# Patient Record
Sex: Female | Born: 1970 | Race: Black or African American | Hispanic: No | Smoking: Former smoker
Health system: Southern US, Community
[De-identification: ages and names within clinical notes are randomized; demographics above are authoritative.]

## PROBLEM LIST (undated history)

## (undated) VITALS — BP 101/56 | HR 71 | Temp 99.0°F | Resp 18 | Ht 66.75 in | Wt 234.0 lb

## (undated) DIAGNOSIS — M549 Dorsalgia, unspecified: Secondary | ICD-10-CM

## (undated) DIAGNOSIS — F419 Anxiety disorder, unspecified: Secondary | ICD-10-CM

## (undated) DIAGNOSIS — E785 Hyperlipidemia, unspecified: Secondary | ICD-10-CM

## (undated) DIAGNOSIS — M069 Rheumatoid arthritis, unspecified: Secondary | ICD-10-CM

## (undated) DIAGNOSIS — F329 Major depressive disorder, single episode, unspecified: Secondary | ICD-10-CM

## (undated) DIAGNOSIS — K219 Gastro-esophageal reflux disease without esophagitis: Secondary | ICD-10-CM

## (undated) DIAGNOSIS — M1710 Unilateral primary osteoarthritis, unspecified knee: Secondary | ICD-10-CM

## (undated) DIAGNOSIS — F32A Depression, unspecified: Secondary | ICD-10-CM

## (undated) DIAGNOSIS — J45909 Unspecified asthma, uncomplicated: Secondary | ICD-10-CM

## (undated) DIAGNOSIS — Z8739 Personal history of other diseases of the musculoskeletal system and connective tissue: Secondary | ICD-10-CM

## (undated) DIAGNOSIS — R413 Other amnesia: Secondary | ICD-10-CM

## (undated) DIAGNOSIS — M1990 Unspecified osteoarthritis, unspecified site: Secondary | ICD-10-CM

## (undated) DIAGNOSIS — R569 Unspecified convulsions: Secondary | ICD-10-CM

## (undated) DIAGNOSIS — E669 Obesity, unspecified: Secondary | ICD-10-CM

## (undated) DIAGNOSIS — R51 Headache: Secondary | ICD-10-CM

## (undated) DIAGNOSIS — Z9889 Other specified postprocedural states: Secondary | ICD-10-CM

## (undated) DIAGNOSIS — G932 Benign intracranial hypertension: Secondary | ICD-10-CM

## (undated) DIAGNOSIS — M179 Osteoarthritis of knee, unspecified: Secondary | ICD-10-CM

## (undated) DIAGNOSIS — M797 Fibromyalgia: Secondary | ICD-10-CM

## (undated) DIAGNOSIS — F319 Bipolar disorder, unspecified: Secondary | ICD-10-CM

## (undated) DIAGNOSIS — L0291 Cutaneous abscess, unspecified: Secondary | ICD-10-CM

## (undated) DIAGNOSIS — R0600 Dyspnea, unspecified: Secondary | ICD-10-CM

## (undated) DIAGNOSIS — M542 Cervicalgia: Secondary | ICD-10-CM

## (undated) HISTORY — DX: Dyspnea, unspecified: R06.00

## (undated) HISTORY — DX: Personal history of other diseases of the musculoskeletal system and connective tissue: Z87.39

## (undated) HISTORY — DX: Rheumatoid arthritis, unspecified: M06.9

## (undated) HISTORY — DX: Cervicalgia: M54.2

## (undated) HISTORY — DX: Bipolar disorder, unspecified: F31.9

## (undated) HISTORY — PX: OOPHORECTOMY: SHX86

## (undated) HISTORY — DX: Benign intracranial hypertension: G93.2

## (undated) HISTORY — DX: Unilateral primary osteoarthritis, unspecified knee: M17.10

## (undated) HISTORY — DX: Fibromyalgia: M79.7

## (undated) HISTORY — DX: Other specified postprocedural states: Z98.890

## (undated) HISTORY — DX: Unspecified asthma, uncomplicated: J45.909

## (undated) HISTORY — DX: Obesity, unspecified: E66.9

## (undated) HISTORY — DX: Hyperlipidemia, unspecified: E78.5

## (undated) HISTORY — DX: Osteoarthritis of knee, unspecified: M17.9

## (undated) HISTORY — DX: Major depressive disorder, single episode, unspecified: F32.9

## (undated) HISTORY — DX: Dorsalgia, unspecified: M54.9

## (undated) HISTORY — DX: Other amnesia: R41.3

---

## 1992-02-16 HISTORY — PX: DILATION AND CURETTAGE OF UTERUS: SHX78

## 1997-10-16 ENCOUNTER — Other Ambulatory Visit: Admission: RE | Admit: 1997-10-16 | Discharge: 1997-10-16 | Payer: Self-pay | Admitting: Obstetrics and Gynecology

## 1997-11-08 ENCOUNTER — Inpatient Hospital Stay (HOSPITAL_COMMUNITY): Admission: AD | Admit: 1997-11-08 | Discharge: 1997-11-08 | Payer: Self-pay | Admitting: Obstetrics and Gynecology

## 1998-02-07 ENCOUNTER — Emergency Department (HOSPITAL_COMMUNITY): Admission: EM | Admit: 1998-02-07 | Discharge: 1998-02-07 | Payer: Self-pay

## 1998-02-11 ENCOUNTER — Ambulatory Visit (HOSPITAL_COMMUNITY): Admission: RE | Admit: 1998-02-11 | Discharge: 1998-02-11 | Payer: Self-pay | Admitting: Obstetrics and Gynecology

## 1998-03-17 ENCOUNTER — Inpatient Hospital Stay (HOSPITAL_COMMUNITY): Admission: AD | Admit: 1998-03-17 | Discharge: 1998-03-17 | Payer: Self-pay | Admitting: Obstetrics and Gynecology

## 1998-03-29 ENCOUNTER — Inpatient Hospital Stay (HOSPITAL_COMMUNITY): Admission: AD | Admit: 1998-03-29 | Discharge: 1998-03-29 | Payer: Self-pay | Admitting: Obstetrics and Gynecology

## 1998-04-14 ENCOUNTER — Inpatient Hospital Stay (HOSPITAL_COMMUNITY): Admission: AD | Admit: 1998-04-14 | Discharge: 1998-04-16 | Payer: Self-pay | Admitting: Obstetrics and Gynecology

## 1999-02-23 ENCOUNTER — Other Ambulatory Visit: Admission: RE | Admit: 1999-02-23 | Discharge: 1999-02-23 | Payer: Self-pay | Admitting: Obstetrics and Gynecology

## 1999-07-08 ENCOUNTER — Encounter: Payer: Self-pay | Admitting: Family Medicine

## 1999-07-08 ENCOUNTER — Ambulatory Visit (HOSPITAL_COMMUNITY): Admission: RE | Admit: 1999-07-08 | Discharge: 1999-07-08 | Payer: Self-pay | Admitting: Family Medicine

## 1999-07-09 ENCOUNTER — Encounter: Payer: Self-pay | Admitting: Family Medicine

## 2000-03-08 ENCOUNTER — Emergency Department (HOSPITAL_COMMUNITY): Admission: EM | Admit: 2000-03-08 | Discharge: 2000-03-09 | Payer: Self-pay | Admitting: Emergency Medicine

## 2000-04-12 ENCOUNTER — Encounter: Payer: Self-pay | Admitting: Obstetrics and Gynecology

## 2000-04-12 ENCOUNTER — Ambulatory Visit (HOSPITAL_COMMUNITY): Admission: RE | Admit: 2000-04-12 | Discharge: 2000-04-12 | Payer: Self-pay | Admitting: Obstetrics and Gynecology

## 2000-07-01 ENCOUNTER — Emergency Department (HOSPITAL_COMMUNITY): Admission: EM | Admit: 2000-07-01 | Discharge: 2000-07-01 | Payer: Self-pay | Admitting: Emergency Medicine

## 2000-12-09 ENCOUNTER — Other Ambulatory Visit: Admission: RE | Admit: 2000-12-09 | Discharge: 2000-12-09 | Payer: Self-pay | Admitting: Obstetrics and Gynecology

## 2001-12-28 ENCOUNTER — Other Ambulatory Visit (HOSPITAL_COMMUNITY): Admission: RE | Admit: 2001-12-28 | Discharge: 2001-12-29 | Payer: Self-pay | Admitting: Psychiatry

## 2002-07-09 ENCOUNTER — Emergency Department (HOSPITAL_COMMUNITY): Admission: EM | Admit: 2002-07-09 | Discharge: 2002-07-10 | Payer: Self-pay | Admitting: Emergency Medicine

## 2002-08-28 ENCOUNTER — Other Ambulatory Visit: Admission: RE | Admit: 2002-08-28 | Discharge: 2002-08-28 | Payer: Self-pay | Admitting: Obstetrics and Gynecology

## 2002-10-15 ENCOUNTER — Ambulatory Visit (HOSPITAL_COMMUNITY): Admission: RE | Admit: 2002-10-15 | Discharge: 2002-10-15 | Payer: Self-pay | Admitting: Obstetrics and Gynecology

## 2002-10-15 ENCOUNTER — Encounter: Payer: Self-pay | Admitting: Obstetrics and Gynecology

## 2002-11-05 ENCOUNTER — Inpatient Hospital Stay (HOSPITAL_COMMUNITY): Admission: AD | Admit: 2002-11-05 | Discharge: 2002-11-05 | Payer: Self-pay | Admitting: Obstetrics and Gynecology

## 2003-01-16 ENCOUNTER — Ambulatory Visit (HOSPITAL_COMMUNITY): Admission: RE | Admit: 2003-01-16 | Discharge: 2003-01-16 | Payer: Self-pay | Admitting: Obstetrics and Gynecology

## 2003-02-10 ENCOUNTER — Observation Stay (HOSPITAL_COMMUNITY): Admission: AD | Admit: 2003-02-10 | Discharge: 2003-02-11 | Payer: Self-pay | Admitting: Obstetrics and Gynecology

## 2003-02-22 ENCOUNTER — Inpatient Hospital Stay (HOSPITAL_COMMUNITY): Admission: AD | Admit: 2003-02-22 | Discharge: 2003-02-25 | Payer: Self-pay | Admitting: Obstetrics and Gynecology

## 2003-02-23 ENCOUNTER — Encounter (INDEPENDENT_AMBULATORY_CARE_PROVIDER_SITE_OTHER): Payer: Self-pay | Admitting: Specialist

## 2003-04-10 ENCOUNTER — Ambulatory Visit (HOSPITAL_COMMUNITY): Admission: RE | Admit: 2003-04-10 | Discharge: 2003-04-10 | Payer: Self-pay | Admitting: Obstetrics and Gynecology

## 2003-07-16 ENCOUNTER — Ambulatory Visit (HOSPITAL_COMMUNITY): Admission: RE | Admit: 2003-07-16 | Discharge: 2003-07-16 | Payer: Self-pay | Admitting: Obstetrics and Gynecology

## 2004-01-09 ENCOUNTER — Emergency Department (HOSPITAL_COMMUNITY): Admission: EM | Admit: 2004-01-09 | Discharge: 2004-01-09 | Payer: Self-pay | Admitting: Emergency Medicine

## 2004-05-12 ENCOUNTER — Emergency Department (HOSPITAL_COMMUNITY): Admission: EM | Admit: 2004-05-12 | Discharge: 2004-05-12 | Payer: Self-pay | Admitting: Emergency Medicine

## 2005-07-27 ENCOUNTER — Other Ambulatory Visit: Admission: RE | Admit: 2005-07-27 | Discharge: 2005-07-27 | Payer: Self-pay | Admitting: Gynecology

## 2006-10-20 ENCOUNTER — Emergency Department (HOSPITAL_COMMUNITY): Admission: EM | Admit: 2006-10-20 | Discharge: 2006-10-21 | Payer: Self-pay | Admitting: Emergency Medicine

## 2006-11-14 ENCOUNTER — Emergency Department (HOSPITAL_COMMUNITY): Admission: EM | Admit: 2006-11-14 | Discharge: 2006-11-14 | Payer: Self-pay | Admitting: Emergency Medicine

## 2006-11-21 ENCOUNTER — Emergency Department (HOSPITAL_COMMUNITY): Admission: EM | Admit: 2006-11-21 | Discharge: 2006-11-21 | Payer: Self-pay | Admitting: Emergency Medicine

## 2007-03-09 ENCOUNTER — Ambulatory Visit: Payer: Self-pay | Admitting: Gastroenterology

## 2007-03-09 LAB — CONVERTED CEMR LAB
AST: 18 units/L (ref 0–37)
Albumin: 4.1 g/dL (ref 3.5–5.2)
Basophils Absolute: 0 10*3/uL (ref 0.0–0.1)
Basophils Relative: 0.6 % (ref 0.0–1.0)
CO2: 29 meq/L (ref 19–32)
Chloride: 103 meq/L (ref 96–112)
Creatinine, Ser: 1 mg/dL (ref 0.4–1.2)
Eosinophils Relative: 0.9 % (ref 0.0–5.0)
HCT: 37.5 % (ref 36.0–46.0)
Hemoglobin: 12.8 g/dL (ref 12.0–15.0)
MCHC: 34 g/dL (ref 30.0–36.0)
Monocytes Absolute: 0.4 10*3/uL (ref 0.2–0.7)
Neutrophils Relative %: 59.9 % (ref 43.0–77.0)
RBC: 4.43 M/uL (ref 3.87–5.11)
RDW: 11.7 % (ref 11.5–14.6)
Sodium: 137 meq/L (ref 135–145)
TSH: 0.51 microintl units/mL (ref 0.35–5.50)
Total Bilirubin: 0.9 mg/dL (ref 0.3–1.2)
Total Protein: 7.5 g/dL (ref 6.0–8.3)
WBC: 4.5 10*3/uL (ref 4.5–10.5)

## 2007-03-10 ENCOUNTER — Encounter: Payer: Self-pay | Admitting: Gastroenterology

## 2007-03-10 ENCOUNTER — Ambulatory Visit: Payer: Self-pay | Admitting: Gastroenterology

## 2007-03-30 ENCOUNTER — Ambulatory Visit (HOSPITAL_COMMUNITY): Admission: RE | Admit: 2007-03-30 | Discharge: 2007-03-30 | Payer: Self-pay | Admitting: Gastroenterology

## 2007-03-30 ENCOUNTER — Encounter: Payer: Self-pay | Admitting: Family Medicine

## 2007-03-30 ENCOUNTER — Encounter: Payer: Self-pay | Admitting: Gastroenterology

## 2007-04-06 ENCOUNTER — Ambulatory Visit: Payer: Self-pay | Admitting: Gastroenterology

## 2007-09-24 ENCOUNTER — Emergency Department (HOSPITAL_COMMUNITY): Admission: EM | Admit: 2007-09-24 | Discharge: 2007-09-24 | Payer: Self-pay | Admitting: Emergency Medicine

## 2007-11-15 ENCOUNTER — Encounter: Admission: RE | Admit: 2007-11-15 | Discharge: 2007-11-15 | Payer: Self-pay | Admitting: Neurology

## 2007-12-20 ENCOUNTER — Ambulatory Visit: Payer: Self-pay | Admitting: Psychiatry

## 2007-12-20 ENCOUNTER — Inpatient Hospital Stay (HOSPITAL_COMMUNITY): Admission: AD | Admit: 2007-12-20 | Discharge: 2007-12-22 | Payer: Self-pay | Admitting: Psychiatry

## 2008-02-16 HISTORY — PX: CARDIAC CATHETERIZATION: SHX172

## 2008-03-04 ENCOUNTER — Encounter: Payer: Self-pay | Admitting: Gastroenterology

## 2008-03-07 ENCOUNTER — Telehealth (INDEPENDENT_AMBULATORY_CARE_PROVIDER_SITE_OTHER): Payer: Self-pay | Admitting: *Deleted

## 2008-03-11 ENCOUNTER — Encounter: Payer: Self-pay | Admitting: Gastroenterology

## 2008-03-11 DIAGNOSIS — K319 Disease of stomach and duodenum, unspecified: Secondary | ICD-10-CM | POA: Insufficient documentation

## 2008-03-12 ENCOUNTER — Encounter (INDEPENDENT_AMBULATORY_CARE_PROVIDER_SITE_OTHER): Payer: Self-pay | Admitting: *Deleted

## 2008-03-28 ENCOUNTER — Telehealth: Payer: Self-pay | Admitting: Gastroenterology

## 2008-04-04 ENCOUNTER — Telehealth: Payer: Self-pay | Admitting: Gastroenterology

## 2008-04-04 ENCOUNTER — Ambulatory Visit (HOSPITAL_COMMUNITY): Admission: RE | Admit: 2008-04-04 | Discharge: 2008-04-04 | Payer: Self-pay | Admitting: Gastroenterology

## 2008-04-04 ENCOUNTER — Ambulatory Visit: Payer: Self-pay | Admitting: Gastroenterology

## 2008-04-22 ENCOUNTER — Emergency Department (HOSPITAL_COMMUNITY): Admission: EM | Admit: 2008-04-22 | Discharge: 2008-04-22 | Payer: Self-pay | Admitting: Emergency Medicine

## 2008-05-23 ENCOUNTER — Emergency Department (HOSPITAL_COMMUNITY): Admission: EM | Admit: 2008-05-23 | Discharge: 2008-05-23 | Payer: Self-pay | Admitting: Emergency Medicine

## 2008-11-19 DIAGNOSIS — G43909 Migraine, unspecified, not intractable, without status migrainosus: Secondary | ICD-10-CM

## 2008-11-19 DIAGNOSIS — J45909 Unspecified asthma, uncomplicated: Secondary | ICD-10-CM | POA: Insufficient documentation

## 2008-11-19 DIAGNOSIS — E669 Obesity, unspecified: Secondary | ICD-10-CM

## 2008-11-20 ENCOUNTER — Ambulatory Visit: Payer: Self-pay | Admitting: Pulmonary Disease

## 2008-11-29 ENCOUNTER — Encounter: Payer: Self-pay | Admitting: Pulmonary Disease

## 2008-11-29 ENCOUNTER — Emergency Department (HOSPITAL_COMMUNITY): Admission: EM | Admit: 2008-11-29 | Discharge: 2008-11-29 | Payer: Self-pay | Admitting: Emergency Medicine

## 2008-12-05 ENCOUNTER — Ambulatory Visit: Payer: Self-pay | Admitting: Pulmonary Disease

## 2008-12-05 DIAGNOSIS — R0602 Shortness of breath: Secondary | ICD-10-CM

## 2008-12-05 DIAGNOSIS — J90 Pleural effusion, not elsewhere classified: Secondary | ICD-10-CM | POA: Insufficient documentation

## 2008-12-05 DIAGNOSIS — G932 Benign intracranial hypertension: Secondary | ICD-10-CM | POA: Insufficient documentation

## 2008-12-06 ENCOUNTER — Encounter: Payer: Self-pay | Admitting: Pulmonary Disease

## 2008-12-13 LAB — CONVERTED CEMR LAB: Angiotensin 1 Converting Enzyme: 25 units/L (ref 9–67)

## 2008-12-17 ENCOUNTER — Encounter: Payer: Self-pay | Admitting: Pulmonary Disease

## 2008-12-17 ENCOUNTER — Ambulatory Visit: Admission: RE | Admit: 2008-12-17 | Discharge: 2008-12-17 | Payer: Self-pay | Admitting: Pulmonary Disease

## 2008-12-20 ENCOUNTER — Telehealth: Payer: Self-pay | Admitting: Pulmonary Disease

## 2008-12-23 ENCOUNTER — Ambulatory Visit: Payer: Self-pay | Admitting: Pulmonary Disease

## 2008-12-23 DIAGNOSIS — I319 Disease of pericardium, unspecified: Secondary | ICD-10-CM | POA: Insufficient documentation

## 2008-12-24 ENCOUNTER — Telehealth (INDEPENDENT_AMBULATORY_CARE_PROVIDER_SITE_OTHER): Payer: Self-pay | Admitting: *Deleted

## 2009-01-23 ENCOUNTER — Ambulatory Visit (HOSPITAL_COMMUNITY): Admission: RE | Admit: 2009-01-23 | Discharge: 2009-01-23 | Payer: Self-pay | Admitting: Cardiology

## 2009-05-14 ENCOUNTER — Encounter: Admission: RE | Admit: 2009-05-14 | Discharge: 2009-05-14 | Payer: Self-pay | Admitting: Family Medicine

## 2009-10-15 ENCOUNTER — Emergency Department (HOSPITAL_COMMUNITY): Admission: EM | Admit: 2009-10-15 | Discharge: 2009-10-15 | Payer: Self-pay | Admitting: Emergency Medicine

## 2010-02-19 ENCOUNTER — Ambulatory Visit (HOSPITAL_COMMUNITY)
Admission: RE | Admit: 2010-02-19 | Discharge: 2010-02-19 | Payer: Self-pay | Source: Home / Self Care | Attending: Obstetrics and Gynecology | Admitting: Obstetrics and Gynecology

## 2010-03-07 ENCOUNTER — Encounter: Payer: Self-pay | Admitting: Obstetrics and Gynecology

## 2010-03-08 ENCOUNTER — Encounter: Payer: Self-pay | Admitting: Orthopedic Surgery

## 2010-03-19 NOTE — Procedures (Signed)
Summary: Recall / [facility]  Recall / [facility]   Imported By: Lennie Odor 07/18/2009 14:40:04  _____________________________________________________________________  External Attachment:    Type:   Image     Comment:   External Document

## 2010-04-30 LAB — COMPREHENSIVE METABOLIC PANEL
ALT: 38 U/L — ABNORMAL HIGH (ref 0–35)
Alkaline Phosphatase: 85 U/L (ref 39–117)
CO2: 27 mEq/L (ref 19–32)
GFR calc non Af Amer: >60 mL/min (ref >60)
Glucose, Bld: 98 mg/dL (ref 70–99)
Potassium: 3.8 mEq/L (ref 3.5–5.1)
Sodium: 140 mEq/L (ref 135–145)

## 2010-04-30 LAB — DIFFERENTIAL
Basophils Absolute: 0 10*3/uL (ref 0.0–0.1)
Basophils Relative: 1 % (ref 0–1)
Eosinophils Absolute: 0 10*3/uL (ref 0.0–0.7)
Neutrophils Relative %: 37 % — ABNORMAL LOW (ref 43–77)

## 2010-04-30 LAB — CBC
HCT: 35.8 % — ABNORMAL LOW (ref 36.0–46.0)
Hemoglobin: 11.8 g/dL — ABNORMAL LOW (ref 12.0–15.0)
MCHC: 33 g/dL (ref 30.0–36.0)
MCV: 83.5 fL (ref 78.0–100.0)
WBC: 4.6 10*3/uL (ref 4.0–10.5)

## 2010-04-30 LAB — LIPASE, BLOOD: Lipase: 31 U/L (ref 11–59)

## 2010-05-08 ENCOUNTER — Other Ambulatory Visit: Payer: Self-pay | Admitting: Obstetrics and Gynecology

## 2010-05-08 DIAGNOSIS — Z1231 Encounter for screening mammogram for malignant neoplasm of breast: Secondary | ICD-10-CM

## 2010-05-11 ENCOUNTER — Ambulatory Visit
Admission: RE | Admit: 2010-05-11 | Discharge: 2010-05-11 | Disposition: A | Discharge status: Home / Self Care | Payer: Medicaid Other | Source: Ambulatory Visit | Attending: Obstetrics and Gynecology | Admitting: Obstetrics and Gynecology

## 2010-05-11 ENCOUNTER — Ambulatory Visit: Payer: Self-pay

## 2010-05-11 DIAGNOSIS — Z1231 Encounter for screening mammogram for malignant neoplasm of breast: Secondary | ICD-10-CM

## 2010-05-19 LAB — POCT I-STAT 3, VENOUS BLOOD GAS (G3P V)
Bicarbonate: 19.2 mEq/L — ABNORMAL LOW (ref 20.0–24.0)
TCO2: 20 mmol/L (ref 0–100)
pCO2, Ven: 41.3 mmHg — ABNORMAL LOW (ref 45.0–50.0)
pH, Ven: 7.274 (ref 7.250–7.300)
pO2, Ven: 41 mmHg (ref 30.0–45.0)

## 2010-05-19 LAB — POCT I-STAT 3, ART BLOOD GAS (G3+)
pCO2 arterial: 37 mmHg (ref 35.0–45.0)
pH, Arterial: 7.322 — ABNORMAL LOW (ref 7.350–7.400)

## 2010-05-21 LAB — DIFFERENTIAL
Basophils Absolute: 0 10*3/uL (ref 0.0–0.1)
Eosinophils Absolute: 0 10*3/uL (ref 0.0–0.7)
Eosinophils Relative: 1 % (ref 0–5)
Lymphocytes Relative: 25 % (ref 12–46)
Monocytes Absolute: 0.2 10*3/uL (ref 0.1–1.0)

## 2010-05-21 LAB — CBC
HCT: 37.3 % (ref 36.0–46.0)
Hemoglobin: 12.4 g/dL (ref 12.0–15.0)
MCHC: 33.1 g/dL (ref 30.0–36.0)
MCV: 85.1 fL (ref 78.0–100.0)
Platelets: 326 10*3/uL (ref 150–400)
RDW: 13.3 % (ref 11.5–15.5)

## 2010-05-21 LAB — POCT I-STAT, CHEM 8
BUN: 16 mg/dL (ref 6–23)
Creatinine, Ser: 0.9 mg/dL (ref 0.4–1.2)
Glucose, Bld: 93 mg/dL (ref 70–99)
Potassium: 4.2 mEq/L (ref 3.5–5.1)
Sodium: 142 mEq/L (ref 135–145)
TCO2: 23 mmol/L (ref 0–100)

## 2010-05-21 LAB — POCT I-STAT 3, ART BLOOD GAS (G3+)
pCO2 arterial: 31.5 mmHg — ABNORMAL LOW (ref 35.0–45.0)
pH, Arterial: 7.393 (ref 7.350–7.400)
pO2, Arterial: 129 mmHg — ABNORMAL HIGH (ref 80.0–100.0)

## 2010-05-21 LAB — POCT CARDIAC MARKERS
CKMB, poc: <1 ng/mL — ABNORMAL LOW (ref 1.0–8.0)
Myoglobin, poc: 36.6 ng/mL (ref 12–200)

## 2010-06-30 NOTE — Assessment & Plan Note (Signed)
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 She is referred today  by Dr. Hulan Saas for evaluation of  recurrent epigastric pain.   Brittney [name] has had recurrent attacks of burning epigastric pain with rather  severe acid reflux symptoms for the last year. She was seen in the  emergency room in October and had a negative pregnancy test and was  placed on PPI therapy and was treated for a UTI. She had transient  relief of her symptoms but she currently is having reflux symptoms  almost every day with weekly attacks of rather severe epigastric burning  pain without radiation. There has been no nausea, vomiting, anorexia,  weight loss or hepatobiliary complaints. She has not had a gallbladder  ultrasound. She had a chest x-ray at the time of spontaneous delivery  several years ago that was normal and urinalysis was negative. I cannot  see recent laboratory data. She however gives no history of hepatitis or  pancreatitis. Her appetite has been good and she has had a 20 pound  weight gain over the last few months. She denies food intolerances but  she does have lactose intolerance. She has regular bowel movements,  denies melena or hematochezia. She does not abuse alcohol, cigarettes or  NSAIDs.   PAST MEDICAL HISTORY:  Otherwise noncontributory except for chronic  bipolar disorder and she is on Lithium, Abilify and Adderall XR. She has  been using p.r.n. Mylanta. She denies drug allergies.   FAMILY HISTORY:  Remarkable for alcoholism and cirrhosis in her mother.  She has a sister who recently had a cholecystectomy for presumed  gallstones.   SOCIAL  HISTORY:  She is married and lives with her husband and kids. She  has a Naval architect. She does not smoke or abuse ethanol.   REVIEW OF SYSTEMS:  Remarkable for amenorrhea, chronic fatigue, night  sweats, frequent urination, chronic insomnia. She denies any specific  cardiovascular, pulmonary, or neuropsychiatric problems at this time. As  mentioned above, she does carry a diagnosis of bipolar depression.   PHYSICAL EXAMINATION:  She is a healthy-appearing, black female  appearing her stated age in no acute distress.  She is 5 feet 8 and weighs 220 pounds. Blood pressure is 100/66 and  pulse was 76 and regular. I could not appreciate stigmata of chronic  liver disease or thyromegaly. Chest was clear and she was in a regular  rhythm without murmur, gallop or rub. There was no hepatosplenomegaly,  abdominal masses or significant tenderness. Peripheral extremities were  unremarkable. Mental status was normal.   ASSESSMENT:  1. Probable gastroesophageal reflux disease with episodes of      esophageal spasms - consider cholelithiasis.  2. History of bipolar depression.  3. Family history of alcoholic cirrhosis.  4. History of asthmatic bronchitis and perhaps related acid reflux.   RECOMMENDATIONS:  1. Outpatient endoscopy as soon as possible. Should this be      unremarkable we will proceed with ultrasonography.  2. Screening laboratory parameters including liver function tests.  3. Start AcipHex 20 mg 30 minutes before breakfast until workup can be      completed.  4. Continue other neuropsychiatric medications as per Dr. Hulan Saas and      Dr. Holley Bouche.     Vania Rea. Jarold Motto, MD, Caleen Essex, FAGA  Electronically Signed    DRP/MedQ  DD: 03/09/2007  DT: 03/09/2007  Job #: [id number]   cc:   Janetta Hora. Hulan Saas, M.D.  Holley Bouche, M.D.

## 2010-07-01 ENCOUNTER — Other Ambulatory Visit (HOSPITAL_COMMUNITY): Payer: Medicaid Other

## 2010-07-02 ENCOUNTER — Ambulatory Visit (HOSPITAL_COMMUNITY)
Admission: RE | Admit: 2010-07-02 | Payer: Medicaid Other | Source: Ambulatory Visit | Admitting: Obstetrics and Gynecology

## 2010-07-03 NOTE — Discharge Summary (Signed)
NAME:  Brittney [name], Brittney [name]                            ACCOUNT NO.:  [id number]   MEDICAL RECORD NO.:  [id number]                   PATIENT TYPE:  INP   LOCATION:  9319                                 FACILITY:  [facility]   PHYSICIAN:  Malachi Pro. Ambrose Mantle, M.D.              DATE OF BIRTH:  Feb 06, 1971   DATE OF ADMISSION:  02/22/2003  DATE OF DISCHARGE:  02/25/2003                                 DISCHARGE SUMMARY   HISTORY OF PRESENT ILLNESS:  This is a 40 year old black female para 4-0-1-  4, gravida 6, last period May 15, 2002, [facility] March 07, 2003 by ultrasound  at 7 weeks and 5 days admitted with contractions and cervical dilatation of  4 cm.  Blood group and type O+ with a negative antibody.  Sickle cell  negative.  RPR nonreactive.  Rubella immune.  Hepatitis B surface antigen  negative.  HIV declined.  GC and Chlamydia negative.  Triple screen normal.  One-hour Glucola 79.  Group B Strep negative.  Vaginal ultrasound on August 13, 2002:  Crown rump length 3.79 cm, 10 weeks 5 days, [facility] March 06, 2003.  Repeat ultrasound on October 15, 2002:  Average gestational age [redacted] weeks 5  days, [facility] March 06, 2003.  At 10-week ultrasound complex cyst of the right  ovary was seen.  It persisted throughout the pregnancy.  CA-125 was 8.1.  The patient was seen in the emergency room on November 05, 2002 for asthma.  On February 11, 2003 the patient was observed overnight at [facility]  [facility] [place] because of cervical change.  For the last 11 days the cervix  has been 4 cm dilated.  Today the patient called our office complaining of  contractions every three to five minutes.  Cervix was unchanged but because  of multiparity and 4 cm dilatation she was admitted.  She has been depressed  due to separation.  She claims her husband is bisexual.   ALLERGIES:  None known.   OPERATION:  In 1994 D&C.   PAST MEDICAL HISTORY:  Asthma.   FAMILY HISTORY:  Maternal grandmother heart disease.  Paternal  grandmother  lung cancer.   PAST OBSTETRICAL HISTORY:  In July of 1991 she delivered a 7 pound 4 ounce  female vaginally.  In August of 1993 a 6 pound 4 ounce female vaginally, May  of 1998 a 7 pound 9 ounce female vaginally, February 2000 a 5 pound 11 ounce  female vaginally.  In 2002 she had an early abortion.   PHYSICAL EXAMINATION:  VITAL SIGNS:  Normal vital signs.  HEART:  Normal.  LUNGS:  Normal.  ABDOMEN:  Soft.  Fundal height 41 cm.  Fetal heart tones normal.  There were  no decelerations.  PELVIC:  The cervix was 4+ cm, 70%, vertex at a -3/-4.  Artificial rupture  of membranes produced clear fluid.  The patient established a labor  pattern  without Pitocin.  By 7:40 p.m. the cervix was 5 cm, 80%, and anterior.  Vertex at a -3.  By 8:10 p.m. the cervix was 5+ cm.  The patient received an  epidural at 9:40 p.m.  Contractions were every three to four minutes.  Cervix 8-9 cm at 11:20 p.m.  By 12:25 a.m. the cervix was 9 cm.  Vertex at a  0 to -1 station.  She became fully dilated and with one push delivered  spontaneously OA over an intact perineum by Dr. Ambrose Mantle a living female  infant 6 pounds 13 ounces, Apgars of 9 at one and 9 at five minutes.  One  loop of nuchal cord was present.  Placenta was intact.  Uterus normal.  Blood loss about 500 mL.  The anterior cervix prolapsed through the  introitus after delivery.  Postpartum the patient did well and was  discharged on the second postpartum day.  Initial hemoglobin 11.9,  hematocrit 34.8, white count 11,400, platelet count 305,000.  Follow-up  hemoglobin 11.2.  RPR nonreactive.   FINAL DIAGNOSES:  1. Intrauterine pregnancy at 38+ weeks, delivered occiput anterior.  2. Right adnexal mass.   OPERATION:  Spontaneous delivery occiput anterior.   FINAL CONDITION:  Improved.   DISCHARGE INSTRUCTIONS:  Our regular discharge instruction booklet.  The  patient is advised that we will follow up the adnexal mass postpartum and  quite  likely the patient will require surgery if the mass persists.                                               Malachi Pro. Ambrose Mantle, M.D.    TFH/MEDQ  D:  02/25/2003  T:  02/25/2003  Job:  [id number]

## 2010-07-03 NOTE — Discharge Summary (Signed)
NAME:  Brittney [name], Brittney [name]                            ACCOUNT NO.:  [id number]   MEDICAL RECORD NO.:  [id number]                   PATIENT TYPE:  OBV   LOCATION:  9164                                 [facility]ILITY:  [facility]   PHYSICIAN:  Malachi Pro. Ambrose Mantle, M.D.              DATE OF BIRTH:  [birth year]   DATE OF ADMISSION:  02/10/2003  DATE OF DISCHARGE:                                 DISCHARGE SUMMARY   HOSPITAL COURSE:  This is a 39 year old black female para 3-1-1-4 gravida 6  with [facility] March 06, 2003 admitted with cervical change, uterine  contractions, and late decelerations with the contractions.  The patient's  [facility] is March 06, 2003 based on an ultrasound at 10 weeks five days.  She  has been followed with a complex right adnexal cyst.  The patient began  contracting on February 10, 2003.  She came here for evaluation and  according to the nurse's exam the cervix was 1.5 cm dilated when she was  first evaluated.  I examined her later and found the cervix to be 3 cm.  She  was also supine in bed without a lateral tilt and had late decelerations  with at least four contractions.  This was corrected by position change.  Her past medical history revealed no known allergies.  She did have a  history of asthma.  She had a D&C in 1994.  She had a minimal significant  family history.  Her maternal grandmother had heart disease and paternal  grandmother had lung cancer.  She does not smoke, drink, or take drugs.  She  had a history of three term deliveries, one preterm, and one early abortion.  On admission her vital signs were normal.  Heart and lungs were clear.  The  abdomen was soft.  Fundal height had been 34 cm on January 23, 2003.  By my  exam the cervix was 3 cm, 50%, vertex, at a -3.  She was contracting  regularly but unfortunately was on her back and had late decelerations.  Her  contractions subsequently spaced out.  The baby was reactive but before she  was discharged I felt it was  necessary to do an OCT which we did and the OCT  was negative.  The oxytocin was stopped.  I reexamined her.  There has been  no significant cervical change since she was admitted for observation 13  hours ago.   FINAL DIAGNOSES:  1. Intrauterine pregnancy at 36+ weeks.  2. False labor.   OPERATION:  None.   FINAL CONDITION:  Improved.   INSTRUCTIONS:  Watch the baby's movements, call with any unusual problems,  avoid heavy lifting or strenuous activity, no vaginal entrance, and return  to the office on February 13, 2003 for follow-up examination.  Malachi Pro. Ambrose Mantle, M.D.    TFH/MEDQ  D:  02/11/2003  T:  02/11/2003  Job:  [id number]

## 2010-11-17 LAB — COMPREHENSIVE METABOLIC PANEL
ALT: 8
Alkaline Phosphatase: 68
CO2: 25
Chloride: 111
GFR calc non Af Amer: >60
Glucose, Bld: 87
Potassium: 3.7
Sodium: 141
Total Bilirubin: 0.8

## 2010-11-17 LAB — URINALYSIS, ROUTINE W REFLEX MICROSCOPIC
Glucose, UA: NEGATIVE
Ketones, ur: 15 — AB
Protein, ur: NEGATIVE

## 2010-11-17 LAB — DRUGS OF ABUSE SCREEN W/O ALC, ROUTINE URINE
Cocaine Metabolites: NEGATIVE
Creatinine,U: 346.4
Methadone: NEGATIVE
Opiate Screen, Urine: NEGATIVE

## 2010-11-17 LAB — CBC
HCT: 38.2
Hemoglobin: 12.4
RBC: 4.57

## 2010-11-17 LAB — DIFFERENTIAL
Basophils Absolute: 0
Basophils Relative: 1
Eosinophils Absolute: 0.1
Monocytes Relative: 9
Neutrophils Relative %: 44

## 2010-11-17 LAB — TSH: TSH: 0.633

## 2010-11-26 LAB — POCT URINALYSIS DIP (DEVICE)
Bilirubin Urine: NEGATIVE
Glucose, UA: NEGATIVE
Hgb urine dipstick: NEGATIVE
Specific Gravity, Urine: 1.015

## 2010-11-26 LAB — POCT PREGNANCY, URINE: Operator id: 116391

## 2011-10-04 ENCOUNTER — Emergency Department (HOSPITAL_COMMUNITY)
Admission: EM | Admit: 2011-10-04 | Discharge: 2011-10-04 | Disposition: A | Discharge status: Home / Self Care | Payer: No Typology Code available for payment source | Attending: Emergency Medicine | Admitting: Emergency Medicine

## 2011-10-04 ENCOUNTER — Encounter (HOSPITAL_COMMUNITY): Payer: Self-pay | Admitting: *Deleted

## 2011-10-04 ENCOUNTER — Emergency Department (HOSPITAL_COMMUNITY): Payer: No Typology Code available for payment source

## 2011-10-04 DIAGNOSIS — T07XXXA Unspecified multiple injuries, initial encounter: Secondary | ICD-10-CM | POA: Insufficient documentation

## 2011-10-04 DIAGNOSIS — Z87891 Personal history of nicotine dependence: Secondary | ICD-10-CM | POA: Insufficient documentation

## 2011-10-04 DIAGNOSIS — Y92410 Unspecified street and highway as the place of occurrence of the external cause: Secondary | ICD-10-CM | POA: Insufficient documentation

## 2011-10-04 DIAGNOSIS — Z888 Allergy status to other drugs, medicaments and biological substances status: Secondary | ICD-10-CM | POA: Insufficient documentation

## 2011-10-04 MED ORDER — HYDROCODONE-ACETAMINOPHEN 5-325 MG PO TABS: 2.0000 | ORAL_TABLET | ORAL | Status: AC | PRN

## 2011-10-04 MED ORDER — HYDROCODONE-ACETAMINOPHEN 5-325 MG PO TABS
2.0000 | ORAL_TABLET | Freq: Once | ORAL | Status: AC
  Administered 2011-10-04: 2 via ORAL
  Filled 2011-10-04: qty 2
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
## 2011-10-04 NOTE — ED Notes (Signed)
Pt discharged home. Had no further questions at the time. Encouraged to follow up with PCP. 

## 2012-11-07 ENCOUNTER — Encounter (HOSPITAL_COMMUNITY): Payer: Self-pay | Admitting: Pharmacist

## 2012-11-20 ENCOUNTER — Other Ambulatory Visit: Payer: Self-pay | Admitting: Obstetrics and Gynecology

## 2012-11-20 ENCOUNTER — Other Ambulatory Visit (HOSPITAL_COMMUNITY): Payer: Medicaid Other

## 2012-11-20 ENCOUNTER — Encounter (HOSPITAL_COMMUNITY)
Admission: RE | Admit: 2012-11-20 | Discharge: 2012-11-20 | Disposition: A | Discharge status: Home / Self Care | Payer: Medicaid Other | Source: Ambulatory Visit | Attending: Obstetrics and Gynecology | Admitting: Obstetrics and Gynecology

## 2012-11-20 ENCOUNTER — Encounter (HOSPITAL_COMMUNITY): Payer: Self-pay

## 2012-11-20 HISTORY — DX: Gastro-esophageal reflux disease without esophagitis: K21.9

## 2012-11-20 HISTORY — DX: Unspecified convulsions: R56.9

## 2012-11-20 HISTORY — DX: Unspecified osteoarthritis, unspecified site: M19.90

## 2012-11-20 HISTORY — DX: Depression, unspecified: F32.A

## 2012-11-20 HISTORY — DX: Major depressive disorder, single episode, unspecified: F32.9

## 2012-11-20 HISTORY — DX: Anxiety disorder, unspecified: F41.9

## 2012-11-20 HISTORY — DX: Headache: R51

## 2012-11-20 LAB — CBC
Hemoglobin: 12.6 g/dL (ref 12.0–15.0)
MCHC: 32.3 g/dL (ref 30.0–36.0)
Platelets: 290 10*3/uL (ref 150–400)
RBC: 4.71 MIL/uL (ref 3.87–5.11)
RDW: 13.4 % (ref 11.5–15.5)
WBC: 5.7 10*3/uL (ref 4.0–10.5)

## 2012-11-20 NOTE — H&P (Signed)
NAME:  Brittney [name], Brittney [name]                      ACCOUNT NO.:  MEDICAL RECORD NO.:  [id number]  LOCATION:                                 FACILITY:  PHYSICIAN:  Malachi Pro. Ambrose Mantle, M.D. DATE OF BIRTH:  October 24, 1970  DATE OF ADMISSION: DATE OF DISCHARGE:                             HISTORY & PHYSICAL   PRESENT ILLNESS:  This is a 42 year old black female, para 4-1-1-5, gravida 6 who is admitted for surgical removal of a pelvic mass.  The patient's last menstrual period was in 2010.  She has had ovarian cyst for a long time.  The ovarian cyst was 1st noted on a CT scan on May 14, 2009 and was 3.4 x 4.6 cm.  The patient was followed by ultrasound, however, she moved to [place] and was lost followup for 2 and half years in March 2012 until August 2014.  When she was last seen here in 2011, she admitted this cyst was 4.5 x 4.3 x 2.9 cm.  It had scattered faint low-level echoes within the lumen and there was 11 x 4 mm possible solid nodule.  As stated the patient moved to [place] and I did not see her again until her return here in 2014.  She underwent an ultrasound on September 28, 2012, that showed a 7.2 x 5 cm cyst of the right ovary that had multiple low-level echoes within the lumen and a possible nodule on the surface of the inferior aspect of the ovary.  CA- 125 was normal and I advised the patient to have it removed because, 1. She is post menopausal. 2. The mass was growing when it was initially seen. 3. The low-level echoes within the lumen were increased as well as the     mural nodule was present.  She agreed and we were ready to do     surgery.  PAST MEDICAL HISTORY:  No known allergies.  She claims to have rheumatoid arthritis from 2010.  She had bipolar diagnosed in 2000 and she has had obesity since 2004.  SURGICAL HISTORY:  D and C in 2002 and 1994.  MEDICATIONS:  Bupropion 300 mg once a day, meloxicam as needed, Seroquel 300 mg at night.  PHYSICAL EXAMINATION:  GENERAL:   This is a well-developed, well- nourished, slightly obese black female, in no distress. VITAL SIGNS:  Blood pressure is 120/72, pulse is 80, height is 5 feet 8 inches, weight 226 pounds.  BMI 34.4. HEAD, EYES, NOSE AND THROAT:  Normal. NECK:  Supple without thyromegaly. LUNGS:  Clear to auscultation. HEART:  Normal size and sounds.  No murmurs. ABDOMEN:  Soft, nontender.  There are no masses palpable. BREASTS:  Soft without masses. PELVIS:  Vulva and vagina are clean.  The cervix is clean.  The uterus is hard to feel.  There is a 6-8 cm right adnexal mass that is cystic.  ADMITTING IMPRESSION:  Persistent right adnexal mass.  The patient is admitted for laparoscopy possible laparotomy to remove the pelvic mass. She understands the risks of surgery include, but are not limited to, hemorrhage, need for reoperation and/or transfusion, infection, pulmonary embolus, and other deep venous thrombosis  problems.  She understands and agrees to proceed.     Malachi Pro. Ambrose Mantle, M.D.     TFH/MEDQ  D:  11/20/2012  T:  11/20/2012  Job:  [id number]

## 2012-11-20 NOTE — Patient Instructions (Addendum)
Your procedure is scheduled on: 11/21/2012  Enter through the Maternity Admissions of [facility] at: 0600AM  Pick up the phone at the desk and dial [phone].  Call this number if you have problems the morning of surgery: [phone].  Remember: Do NOT eat food: AFTER MIDNIGHT 11/20/2012 Do NOT drink clear liquids after:AFTER MIDNIGHT 11/20/2012 Take these medicines the morning of surgery with a SIP OF WATER: N/A  Do NOT wear jewelry, make-up, or nail polish. Do NOT wear lotions, powders, or perfumes.  You may wear deoderant. Do NOT shave for 48 hours prior to surgery. Do NOT bring valuables to the hospital. Contacts, dentures, or bridgework may not be worn into surgery. Leave suitcase in car.  After surgery it may be brought to your room.  For patients admitted to the hospital, checkout time is 11:00 AM the day of discharge.

## 2012-11-21 ENCOUNTER — Ambulatory Visit (HOSPITAL_COMMUNITY): Payer: Medicaid Other | Admitting: Anesthesiology

## 2012-11-21 ENCOUNTER — Ambulatory Visit (HOSPITAL_COMMUNITY)
Admission: RE | Admit: 2012-11-21 | Discharge: 2012-11-22 | Disposition: A | Discharge status: Home / Self Care | Payer: Medicaid Other | Source: Ambulatory Visit | Attending: Obstetrics and Gynecology | Admitting: Obstetrics and Gynecology

## 2012-11-21 ENCOUNTER — Encounter (HOSPITAL_COMMUNITY): Payer: Self-pay | Admitting: Anesthesiology

## 2012-11-21 ENCOUNTER — Encounter (HOSPITAL_COMMUNITY): Payer: Self-pay | Admitting: *Deleted

## 2012-11-21 ENCOUNTER — Encounter (HOSPITAL_COMMUNITY)
Admission: RE | Disposition: A | Discharge status: Home / Self Care | Payer: Self-pay | Source: Ambulatory Visit | Attending: Obstetrics and Gynecology

## 2012-11-21 DIAGNOSIS — D251 Intramural leiomyoma of uterus: Secondary | ICD-10-CM | POA: Insufficient documentation

## 2012-11-21 DIAGNOSIS — N9489 Other specified conditions associated with female genital organs and menstrual cycle: Secondary | ICD-10-CM | POA: Insufficient documentation

## 2012-11-21 DIAGNOSIS — D279 Benign neoplasm of unspecified ovary: Secondary | ICD-10-CM | POA: Insufficient documentation

## 2012-11-21 DIAGNOSIS — N83209 Unspecified ovarian cyst, unspecified side: Secondary | ICD-10-CM

## 2012-11-21 DIAGNOSIS — E669 Obesity, unspecified: Secondary | ICD-10-CM

## 2012-11-21 HISTORY — PX: LAPAROSCOPY: SHX197

## 2012-11-21 LAB — COMPREHENSIVE METABOLIC PANEL
ALT: 19 U/L (ref 0–35)
AST: 23 U/L (ref 0–37)
Albumin: 3.7 g/dL (ref 3.5–5.2)
Alkaline Phosphatase: 109 U/L (ref 39–117)
BUN: 16 mg/dL (ref 6–23)
Calcium: 9.2 mg/dL (ref 8.4–10.5)
GFR calc Af Amer: 88 mL/min — ABNORMAL LOW (ref >90)
Glucose, Bld: 101 mg/dL — ABNORMAL HIGH (ref 70–99)
Potassium: 3.9 mEq/L (ref 3.5–5.1)
Sodium: 137 mEq/L (ref 135–145)
Total Bilirubin: 0.2 mg/dL — ABNORMAL LOW (ref 0.3–1.2)
Total Protein: 7.8 g/dL (ref 6.0–8.3)

## 2012-11-21 LAB — CBC
HCT: 34 % — ABNORMAL LOW (ref 36.0–46.0)
Hemoglobin: 11.1 g/dL — ABNORMAL LOW (ref 12.0–15.0)
MCV: 82.7 fL (ref 78.0–100.0)
RBC: 4.11 MIL/uL (ref 3.87–5.11)
WBC: 10.8 10*3/uL — ABNORMAL HIGH (ref 4.0–10.5)

## 2012-11-21 LAB — URINALYSIS, ROUTINE W REFLEX MICROSCOPIC
Bilirubin Urine: NEGATIVE
Glucose, UA: NEGATIVE mg/dL
Hgb urine dipstick: NEGATIVE
Specific Gravity, Urine: >1.03 — ABNORMAL HIGH (ref 1.005–1.030)
pH: 6 (ref 5.0–8.0)

## 2012-11-21 SURGERY — LAPAROSCOPY OPERATIVE
Anesthesia: General | Site: Abdomen | Wound class: Clean Contaminated

## 2012-11-21 MED ORDER — HYDROMORPHONE HCL PF 1 MG/ML IJ SOLN
0.2500 mg | INTRAMUSCULAR | Status: DC | PRN
  Administered 2012-11-21 (×3): 0.5 mg via INTRAVENOUS

## 2012-11-21 MED ORDER — OXYCODONE-ACETAMINOPHEN 5-325 MG PO TABS
1.0000 | ORAL_TABLET | ORAL | Status: DC | PRN
  Administered 2012-11-21 – 2012-11-22 (×4): 2 via ORAL
  Filled 2012-11-21 (×4): qty 2

## 2012-11-21 MED ORDER — DULOXETINE HCL 60 MG PO CPEP
60.0000 mg | ORAL_CAPSULE | Freq: Every day | ORAL | Status: DC
  Administered 2012-11-21: 60 mg via ORAL
  Filled 2012-11-21: qty 1

## 2012-11-21 MED ORDER — MIDAZOLAM HCL 2 MG/2ML IJ SOLN: 0.5000 mg | Freq: Once | INTRAMUSCULAR | Status: DC | PRN

## 2012-11-21 MED ORDER — PROPOFOL 10 MG/ML IV BOLUS
INTRAVENOUS | Status: DC | PRN
  Administered 2012-11-21: 30 mg via INTRAVENOUS
  Administered 2012-11-21: 170 mg via INTRAVENOUS

## 2012-11-21 MED ORDER — KETOROLAC TROMETHAMINE 30 MG/ML IJ SOLN
INTRAMUSCULAR | Status: AC
  Filled 2012-11-21: qty 1

## 2012-11-21 MED ORDER — BUPIVACAINE HCL (PF) 0.25 % IJ SOLN
INTRAMUSCULAR | Status: AC
  Filled 2012-11-21: qty 30

## 2012-11-21 MED ORDER — FENTANYL CITRATE 0.05 MG/ML IJ SOLN
INTRAMUSCULAR | Status: AC
  Filled 2012-11-21: qty 5

## 2012-11-21 MED ORDER — FLUMAZENIL 0.5 MG/5ML IV SOLN
INTRAVENOUS | Status: DC | PRN
  Administered 2012-11-21: 0.2 mg via INTRAVENOUS

## 2012-11-21 MED ORDER — IBUPROFEN 600 MG PO TABS
600.0000 mg | ORAL_TABLET | Freq: Four times a day (QID) | ORAL | Status: DC | PRN
  Administered 2012-11-21 – 2012-11-22 (×2): 600 mg via ORAL
  Filled 2012-11-21 (×2): qty 1

## 2012-11-21 MED ORDER — METHYLENE BLUE 1 % INJ SOLN
INTRAMUSCULAR | Status: AC
  Filled 2012-11-21: qty 1

## 2012-11-21 MED ORDER — PROPOFOL 10 MG/ML IV EMUL
INTRAVENOUS | Status: AC
  Filled 2012-11-21: qty 20

## 2012-11-21 MED ORDER — FENTANYL CITRATE 0.05 MG/ML IJ SOLN
INTRAMUSCULAR | Status: DC | PRN
  Administered 2012-11-21 (×2): 50 ug via INTRAVENOUS
  Administered 2012-11-21: 25 ug via INTRAVENOUS
  Administered 2012-11-21: 50 ug via INTRAVENOUS
  Administered 2012-11-21: 25 ug via INTRAVENOUS
  Administered 2012-11-21: 50 ug via INTRAVENOUS

## 2012-11-21 MED ORDER — ONDANSETRON HCL 4 MG/2ML IJ SOLN
INTRAMUSCULAR | Status: DC | PRN
  Administered 2012-11-21: 4 mg via INTRAMUSCULAR

## 2012-11-21 MED ORDER — ONDANSETRON HCL 4 MG/2ML IJ SOLN
INTRAMUSCULAR | Status: AC
  Filled 2012-11-21: qty 2

## 2012-11-21 MED ORDER — MEPERIDINE HCL 25 MG/ML IJ SOLN: 6.2500 mg | INTRAMUSCULAR | Status: DC | PRN

## 2012-11-21 MED ORDER — FLUMAZENIL 1 MG/10ML IV SOLN
INTRAVENOUS | Status: AC
  Filled 2012-11-21: qty 10

## 2012-11-21 MED ORDER — INFLUENZA VAC SPLIT QUAD 0.5 ML IM SUSP: 0.5000 mL | INTRAMUSCULAR | Status: DC

## 2012-11-21 MED ORDER — LACTATED RINGERS IV SOLN: INTRAVENOUS | Status: AC

## 2012-11-21 MED ORDER — ONDANSETRON HCL 4 MG PO TABS: 4.0000 mg | ORAL_TABLET | Freq: Four times a day (QID) | ORAL | Status: DC | PRN

## 2012-11-21 MED ORDER — LACTATED RINGERS IV SOLN
INTRAVENOUS | Status: DC
  Administered 2012-11-21 (×2): via INTRAVENOUS

## 2012-11-21 MED ORDER — NEOSTIGMINE METHYLSULFATE 1 MG/ML IJ SOLN
INTRAMUSCULAR | Status: AC
  Filled 2012-11-21: qty 1

## 2012-11-21 MED ORDER — ROCURONIUM BROMIDE 100 MG/10ML IV SOLN
INTRAVENOUS | Status: DC | PRN
  Administered 2012-11-21: 40 mg via INTRAVENOUS

## 2012-11-21 MED ORDER — PROMETHAZINE HCL 25 MG/ML IJ SOLN: 6.2500 mg | INTRAMUSCULAR | Status: DC | PRN

## 2012-11-21 MED ORDER — GLYCOPYRROLATE 0.2 MG/ML IJ SOLN
INTRAMUSCULAR | Status: DC | PRN
  Administered 2012-11-21: 0.2 mg via INTRAVENOUS
  Administered 2012-11-21: 0.6 mg via INTRAVENOUS

## 2012-11-21 MED ORDER — HYDROMORPHONE HCL PF 1 MG/ML IJ SOLN
INTRAMUSCULAR | Status: AC
  Filled 2012-11-21: qty 1

## 2012-11-21 MED ORDER — MIDAZOLAM HCL 2 MG/2ML IJ SOLN
INTRAMUSCULAR | Status: DC | PRN
  Administered 2012-11-21: 2 mg via INTRAVENOUS

## 2012-11-21 MED ORDER — GLYCOPYRROLATE 0.2 MG/ML IJ SOLN
INTRAMUSCULAR | Status: AC
  Filled 2012-11-21: qty 3

## 2012-11-21 MED ORDER — ROCURONIUM BROMIDE 50 MG/5ML IV SOLN
INTRAVENOUS | Status: AC
  Filled 2012-11-21: qty 1

## 2012-11-21 MED ORDER — NEOSTIGMINE METHYLSULFATE 1 MG/ML IJ SOLN
INTRAMUSCULAR | Status: DC | PRN
  Administered 2012-11-21: 3 mg via INTRAVENOUS

## 2012-11-21 MED ORDER — LACTATED RINGERS IR SOLN
Status: DC | PRN
  Administered 2012-11-21: 3000 mL

## 2012-11-21 MED ORDER — LIDOCAINE HCL (CARDIAC) 20 MG/ML IV SOLN
INTRAVENOUS | Status: DC | PRN
  Administered 2012-11-21: 70 mg via INTRAVENOUS
  Administered 2012-11-21: 30 mg via INTRAVENOUS

## 2012-11-21 MED ORDER — ONDANSETRON HCL 4 MG/2ML IJ SOLN: 4.0000 mg | Freq: Four times a day (QID) | INTRAMUSCULAR | Status: DC | PRN

## 2012-11-21 MED ORDER — QUETIAPINE FUMARATE ER 300 MG PO TB24
300.0000 mg | ORAL_TABLET | Freq: Every day | ORAL | Status: DC
  Administered 2012-11-21: 300 mg via ORAL
  Filled 2012-11-21: qty 1

## 2012-11-21 MED ORDER — GLYCOPYRROLATE 0.2 MG/ML IJ SOLN
INTRAMUSCULAR | Status: AC
  Filled 2012-11-21: qty 2

## 2012-11-21 MED ORDER — BUPROPION HCL ER (XL) 300 MG PO TB24
300.0000 mg | ORAL_TABLET | Freq: Every day | ORAL | Status: DC
  Administered 2012-11-21: 300 mg via ORAL
  Filled 2012-11-21: qty 1

## 2012-11-21 MED ORDER — MIDAZOLAM HCL 2 MG/2ML IJ SOLN
INTRAMUSCULAR | Status: AC
  Filled 2012-11-21: qty 2

## 2012-11-21 MED ORDER — DEXAMETHASONE SODIUM PHOSPHATE 10 MG/ML IJ SOLN
INTRAMUSCULAR | Status: AC
  Filled 2012-11-21: qty 1

## 2012-11-21 MED ORDER — PROPRANOLOL HCL 1 MG/ML IV SOLN
INTRAVENOUS | Status: AC
  Filled 2012-11-21: qty 1

## 2012-11-21 MED ORDER — LIDOCAINE HCL (CARDIAC) 20 MG/ML IV SOLN
INTRAVENOUS | Status: AC
  Filled 2012-11-21: qty 5

## 2012-11-21 MED ORDER — DEXAMETHASONE SODIUM PHOSPHATE 10 MG/ML IJ SOLN
INTRAMUSCULAR | Status: DC | PRN
  Administered 2012-11-21: 10 mg via INTRAVENOUS

## 2012-11-21 MED ORDER — PROPOFOL 10 MG/ML IV BOLUS: INTRAVENOUS | Status: DC | PRN

## 2012-11-21 SURGICAL SUPPLY — 53 items
BLADE SURG 15 STRL LF C SS BP (BLADE) ×2 IMPLANT
BLADE SURG 15 STRL SS (BLADE) ×1
CABLE HIGH FREQUENCY MONO STRZ (ELECTRODE) IMPLANT
CANISTER SUCTION 2500CC (MISCELLANEOUS) ×3 IMPLANT
CATH FOLEY 3WAY  5CC 16FR (CATHETERS)
CATH FOLEY 3WAY 5CC 16FR (CATHETERS) IMPLANT
CATH ROBINSON RED A/P 16FR (CATHETERS) ×3 IMPLANT
CLOTH BEACON ORANGE TIMEOUT ST (SAFETY) ×3 IMPLANT
CONTAINER PREFILL 10% NBF 15ML (MISCELLANEOUS) IMPLANT
DECANTER SPIKE VIAL GLASS SM (MISCELLANEOUS) IMPLANT
DERMABOND ADVANCED (GAUZE/BANDAGES/DRESSINGS)
DERMABOND ADVANCED .7 DNX12 (GAUZE/BANDAGES/DRESSINGS) IMPLANT
DRAPE WARM FLUID 44X44 (DRAPE) IMPLANT
DRSG VASELINE 3X18 (GAUZE/BANDAGES/DRESSINGS) ×3 IMPLANT
ELECT BLADE 6 FLAT ULTRCLN (ELECTRODE) IMPLANT
FILTER STRAW FLUID ASPIR (MISCELLANEOUS) IMPLANT
FORCEPS CUTTING 33CM 5MM (CUTTING FORCEPS) IMPLANT
FORCEPS CUTTING 45CM 5MM (CUTTING FORCEPS) IMPLANT
GAUZE SPONGE 4X4 12PLY STRL LF (GAUZE/BANDAGES/DRESSINGS) ×6 IMPLANT
GAUZE SPONGE 4X4 16PLY XRAY LF (GAUZE/BANDAGES/DRESSINGS) IMPLANT
GLOVE BIO SURGEON STRL SZ7.5 (GLOVE) ×3 IMPLANT
GOWN PREVENTION PLUS LG XLONG (DISPOSABLE) ×6 IMPLANT
GOWN PREVENTION PLUS XLARGE (GOWN DISPOSABLE) ×3 IMPLANT
NEEDLE HYPO 25X1 1.5 SAFETY (NEEDLE) IMPLANT
NEEDLE INSUFFLATION 120MM (ENDOMECHANICALS) IMPLANT
NS IRRIG 1000ML POUR BTL (IV SOLUTION) ×3 IMPLANT
PACK ABDOMINAL GYN (CUSTOM PROCEDURE TRAY) ×3 IMPLANT
PACK LAPAROSCOPY BASIN (CUSTOM PROCEDURE TRAY) ×3 IMPLANT
PAD OB MATERNITY 4.3X12.25 (PERSONAL CARE ITEMS) ×3 IMPLANT
PLUG CATH AND CAP STER (CATHETERS) IMPLANT
POUCH SPECIMEN RETRIEVAL 10MM (ENDOMECHANICALS) IMPLANT
PROTECTOR NERVE ULNAR (MISCELLANEOUS) ×3 IMPLANT
RETRACTOR WND ALEXIS 25 LRG (MISCELLANEOUS) IMPLANT
RTRCTR WOUND ALEXIS 25CM LRG (MISCELLANEOUS)
SET CYSTO W/LG BORE CLAMP LF (SET/KITS/TRAYS/PACK) IMPLANT
SET IRRIG TUBING LAPAROSCOPIC (IRRIGATION / IRRIGATOR) ×3 IMPLANT
SOLUTION ELECTROLUBE (MISCELLANEOUS) IMPLANT
SPONGE LAP 18X18 X RAY DECT (DISPOSABLE) ×6 IMPLANT
STAPLER VISISTAT 35W (STAPLE) IMPLANT
STRIP CLOSURE SKIN 1/2X4 (GAUZE/BANDAGES/DRESSINGS) ×3 IMPLANT
SUT PDS AB 1 CT1 36 (SUTURE) ×3 IMPLANT
SUT PLAIN 3 0 FS2 (SUTURE) ×3 IMPLANT
SUT VIC AB 0 CT1 36 (SUTURE) IMPLANT
SUT VIC AB 3-0 CTX 36 (SUTURE) ×6 IMPLANT
SUT VICRYL 0 TIES 12 18 (SUTURE) IMPLANT
SUT VICRYL 0 UR6 27IN ABS (SUTURE) ×6 IMPLANT
SYR 3ML LL SCALE MARK (SYRINGE) IMPLANT
SYR 50ML LL SCALE MARK (SYRINGE) ×3 IMPLANT
SYR CONTROL 10ML LL (SYRINGE) IMPLANT
TOWEL OR 17X24 6PK STRL BLUE (TOWEL DISPOSABLE) ×6 IMPLANT
TRAY FOLEY CATH 14FR (SET/KITS/TRAYS/PACK) ×3 IMPLANT
TROCAR BALLN 12MMX100 BLUNT (TROCAR) ×3 IMPLANT
WATER STERILE IRR 1000ML POUR (IV SOLUTION) ×3 IMPLANT

## 2012-11-21 NOTE — Anesthesia Postprocedure Evaluation (Signed)
  Anesthesia Post-op Note  Patient: Brittney [name]  Procedure(s) Performed: Procedure(s) with comments: LAPAROSCOPY OPERATIVE REMOVAL OF RIGHT TUBE AND OVARY AND FLUID FROM MASS (N/A) - 1 1/2hrs OR time  Patient Location: Women's Unit  Anesthesia Type:General  Level of Consciousness: awake, alert  and oriented  Airway and Oxygen Therapy: Patient Spontanous Breathing  Post-op Pain: none  Post-op Assessment: Post-op Vital signs reviewed and Patient's Cardiovascular Status Stable  Post-op Vital Signs: Reviewed and stable  Complications: No apparent anesthesia complications

## 2012-11-21 NOTE — Progress Notes (Signed)
Patient ID: Brittney [name], female   DOB: 16-Sep-1970, 42 y.o.   MRN: [id number] Pt does not feel ready for d/c. Abdomen slightly tender HGB down slightly.

## 2012-11-21 NOTE — Op Note (Signed)
NAMEELLAROSE, BRANDI                 ACCOUNT NO.:  [id number]  MEDICAL RECORD NO.:  [id number]  LOCATION:  [facility]                          FACILITY:  [facility]  PHYSICIAN:  Malachi Pro. Ambrose Mantle, M.D. DATE OF BIRTH:  Mar 21, 1970  DATE OF PROCEDURE:  11/21/2012 DATE OF DISCHARGE:                              OPERATIVE REPORT   PREOPERATIVE DIAGNOSIS:  Persistent right adnexal mass in a postmenopausal female with significant echogenicity within the mass and a mural nodule with a normal CA-125.  POSTOPERATIVE DIAGNOSIS:  Persistent right adnexal mass in a postmenopausal female with significant echogenicity within the mass and a mural nodule with a normal CA-125.  OPERATION:  Laparoscopic right salpingo-oophorectomy.  SURGEON:  Malachi Pro. Ambrose Mantle, M.D.  ASSISTANT:  Sherron Monday, MD.  ANESTHESIA:  General anesthesia.  DESCRIPTION OF PROCEDURE:  The patient was brought to the operating room, placed under satisfactory general anesthesia.  The abdomen, vulva, and vagina were prepped with Betadine solution.  A time-out was done. The urethra was prepped, a Foley catheter was inserted to straight drain.  Exam revealed a pelvic mass.  I could not feel the uterus well. I inserted a Hulka cannula into the uterus and attached it to the anterior cervical lip.  The abdomen was then draped as a sterile field. Allis clamps were placed across the inferior portion of the umbilicus. The skin was incised, and the incision was made about a 1.5 inches in length, dissected down to the fascia, incised the fascia, entered the peritoneal cavity.  A Hasson cannula was placed into the abdominal cavity.  The balloon was blown up.  Pneumoperitoneum was established and then I inserted 2 auxiliary ports at the level of the umbilicus under direct vision.  I could not manipulate the mass, which was arising from the right ovary into the operative field out of the cul-de-sac, so I grasped the tube and tried to lift it up,  still  could not lift it up, so I began to remove the blood supply from the right tube and ovary.  By using the tripolar instrument, I gradually removed almost all of the vascular attachments.  At this point, Dr. Ellyn Hack was able to put a bag down into the cul-de-sac and lift the mass up.  There was still some remaining tissue that was holding the mass that I coagulated with the tripolar and the mass was completely freed.  It was a 7-cm mass and it would not fit through the incisional opening, so we tried with a 5-mm camera, tried to insert a needle down to the mass while it was in the bag, was unable to do this, so I lifted the mass up to the incisional opening at the umbilicus, and while it was still in the bag, I removed 100 mL of fluid and then the mass was able to be removed through the incision.  There was some leakage of fluid from the entry site since I had to insert the needle through the bag but not much.  The mass was removed along with 100 mL of fluid and this was preserved for pathology and cytology.  The Hasson trocar was again placed into the abdominal  cavity.  I saw the right ureter peristalsis and it was well free of the operative field.  There was never any sign that the bowel was close to the field of surgery.  I liberally irrigated with the __________cannula and it seemed that there was no bleeding.  All pedicles were dry, and I then removed the ancillary trocars under direct vision.  There was no bleeding from either one.  I removed the Hasson cannula after deflating the balloon.  Closed the fascial incision by pulling down on the 0-PDS suture that I had placed before placing the [facility].  I closed the subcu tissue with 3-0 Vicryl, and the skin with 3-0 plain catgut. The patient seemed to tolerate the procedure well.  The Hulka cannula was removed, and she was returned to recovery in satisfactory condition. Blood loss probably 10 mL.     Malachi Pro. Ambrose Mantle,  M.D.     TFH/MEDQ  D:  11/21/2012  T:  11/21/2012  Job:  [id number]

## 2012-11-21 NOTE — Anesthesia Preprocedure Evaluation (Signed)
Anesthesia Evaluation  Patient identified by MRN, date of birth, ID band Patient awake    Reviewed: Allergy & Precautions, H&P , Patient's Chart, lab work & pertinent test results, reviewed documented beta blocker date and time   History of Anesthesia Complications Negative for: history of anesthetic complications  Airway Mallampati: II TM Distance: >3 FB Neck ROM: full    Dental no notable dental hx.    Pulmonary neg pulmonary ROS, asthma ,  breath sounds clear to auscultation  Pulmonary exam normal       Cardiovascular Exercise Tolerance: Good negative cardio ROS  Rhythm:regular Rate:Normal     Neuro/Psych  Headaches, Seizures -,  PSYCHIATRIC DISORDERS Anxiety Depression bipolarnegative neurological ROS  negative psych ROS   GI/Hepatic negative GI ROS, Neg liver ROS, GERD-  Controlled,  Endo/Other  negative endocrine ROS  Renal/GU negative Renal ROS     Musculoskeletal   Abdominal   Peds  Hematology negative hematology ROS (+)   Anesthesia Other Findings Facial seizures  Reproductive/Obstetrics negative OB ROS                           Anesthesia Physical Anesthesia Plan  ASA: II  Anesthesia Plan: General ETT   Post-op Pain Management:    Induction:   Airway Management Planned:   Additional Equipment:   Intra-op Plan:   Post-operative Plan:   Informed Consent: I have reviewed the patients History and Physical, chart, labs and discussed the procedure including the risks, benefits and alternatives for the proposed anesthesia with the patient or authorized representative who has indicated his/her understanding and acceptance.   Dental Advisory Given  Plan Discussed with: CRNA and Surgeon  Anesthesia Plan Comments:         Anesthesia Quick Evaluation

## 2012-11-21 NOTE — Anesthesia Postprocedure Evaluation (Signed)
  Anesthesia Post-op Note  Patient: Brittney [name]  Procedure(s) Performed: Procedure(s) with comments: LAPAROSCOPY OPERATIVE REMOVAL OF RIGHT TUBE AND OVARY AND FLUID FROM MASS (N/A) - 1 1/2hrs OR time  Patient Location: PACU  Anesthesia Type:General  Level of Consciousness: awake, alert  and oriented  Airway and Oxygen Therapy: Patient Spontanous Breathing  Post-op Pain: none  Post-op Assessment: Post-op Vital signs reviewed, Patient's Cardiovascular Status Stable, Respiratory Function Stable, Patent Airway, No signs of Nausea or vomiting and Pain level controlled  Post-op Vital Signs: Reviewed and stable  Complications: No apparent anesthesia complications

## 2012-11-21 NOTE — Progress Notes (Signed)
Patient ID: Brittney [name], female   DOB: Apr 05, 1970, 42 y.o.   MRN: [id number] I examined this lady on 11-20-12 and she reports no change in her health except for some sinus congestion.

## 2012-11-21 NOTE — Transfer of Care (Signed)
Immediate Anesthesia Transfer of Care Note  Patient: Brittney [name]  Procedure(s) Performed: Procedure(s) with comments: LAPAROSCOPY OPERATIVE REMOVAL OF RIGHT TUBE AND OVARY AND FLUID FROM MASS (N/A) - 1 1/2hrs OR time  Patient Location: PACU  Anesthesia Type:General  Level of Consciousness: awake, alert , confused and responds to stimulation  Airway & Oxygen Therapy: Patient Spontanous Breathing and Patient connected to nasal cannula oxygen  Post-op Assessment: Report given to PACU RN and Post -op Vital signs reviewed and stable  Post vital signs: Reviewed and stable  Complications: No apparent anesthesia complications

## 2012-11-22 ENCOUNTER — Encounter (HOSPITAL_COMMUNITY): Payer: Self-pay | Admitting: Obstetrics and Gynecology

## 2012-11-22 MED ORDER — IBUPROFEN 600 MG PO TABS: 600.0000 mg | ORAL_TABLET | Freq: Four times a day (QID) | ORAL | Status: DC | PRN

## 2012-11-22 MED ORDER — HYDROMORPHONE HCL 2 MG PO TABS: 2.0000 mg | ORAL_TABLET | Freq: Four times a day (QID) | ORAL | Status: DC | PRN

## 2012-11-22 NOTE — Discharge Summary (Signed)
Brittney [name], Brittney [name]                 ACCOUNT NO.:  [id number]  MEDICAL RECORD NO.:  [id number]  LOCATION:  9312                          [facility]ACILITY:  [facility]  PHYSICIAN:  Malachi Pro. Ambrose Mantle, M.D. DATE OF BIRTH:  1971-01-18  DATE OF ADMISSION:  11/21/2012 DATE OF DISCHARGE:  11/22/2012                              DISCHARGE SUMMARY   HISTORY OF PRESENT ILLNESS:  A 42 year old black female, para 4-1-1-5, admitted for persistent right adnexal mass that has been present since the CT scan on May 14, 2009, but had recently increased in size and had more internal echoes as well as mural nodule.  She underwent a laparoscopy with removal of the right adnexal mass, including the right tube and ovary.  Postoperatively, she did okay.  On the first postop day, she was afebrile with normal vital signs.  Tolerating a diet, ambulating well without difficulty, voiding well.  She had not passed flatus.  Her abdomen was soft and appropriately tender, and she is ready for discharge.  LABORATORY DATA:  Initial hemoglobin is not present, when it was 13. She underwent a followup hemoglobin after surgery that was 11.1.  On the first postop day, she is considered a candidate for discharge.  FINAL DIAGNOSES:  Right adnexal mass.  Pathology report pending.  The mass was 7 x 5 cm.  OPERATION:  Laparoscopy with right salpingo-oophorectomy.  FINAL CONDITION:  Improved.  INSTRUCTIONS:  Our regular discharge instructions.  No vaginal entrance, no heavy lifting, or strenuous activity.  Call with fever above 100.4 degrees.  Call with any unusual problems.  The patient is advised that she should become more and more uncomfortable.  If she began comes more and more uncomfortable, then she should call, if she has any onset of nausea and vomiting, any other problems that she considers significant. She is to resume all of her home medications including Seroquel, Cymbalta, and Wellbutrin.  I will give her prescriptions for  Motrin 600 mg 30 tablets 1 every 6 hours as needed for pain and Dilaudid to 2 mg 30 tablets 1 every 6 hours as needed for pain, since she does not seem to receive much benefit from Percocet.  She is to return to the office in about 12 days for followup examination.     Malachi Pro. Ambrose Mantle, M.D.     TFH/MEDQ  D:  11/22/2012  T:  11/22/2012  Job:  [id number]

## 2012-11-22 NOTE — Progress Notes (Signed)
Discharge instructions reviewed with patient.  Patient states understanding of home care, medications, activity, signs/symptoms to report to MD and return MD office visit.  No home equipment needed.  Patient ambulated for discharge in stable condition with staff without incident.  

## 2012-11-22 NOTE — Progress Notes (Signed)
Patient ID: Brittney [name], female   DOB: 16-Sep-1970, 42 y.o.   MRN: [id number] #1 afebrile VS normal Pt states she feels improved. She is walking in the hall and voiding well. She is tol;eratind a diet and is not passing flatus. The abdomen is soft bit somewhat tender.

## 2012-11-23 MED FILL — Heparin Sodium (Porcine) Inj 5000 Unit/ML: INTRAMUSCULAR | Qty: 1 | Status: AC

## 2013-01-17 ENCOUNTER — Other Ambulatory Visit: Payer: Self-pay

## 2013-01-17 DIAGNOSIS — Z1231 Encounter for screening mammogram for malignant neoplasm of breast: Secondary | ICD-10-CM

## 2013-01-25 ENCOUNTER — Ambulatory Visit: Payer: Medicaid Other | Attending: Orthopedic Surgery

## 2013-02-21 ENCOUNTER — Other Ambulatory Visit: Payer: Self-pay

## 2013-02-21 ENCOUNTER — Ambulatory Visit
Admission: RE | Admit: 2013-02-21 | Discharge: 2013-02-21 | Disposition: A | Discharge status: Home / Self Care | Payer: Medicaid Other | Source: Ambulatory Visit

## 2013-02-21 DIAGNOSIS — Z1231 Encounter for screening mammogram for malignant neoplasm of breast: Secondary | ICD-10-CM

## 2013-02-27 ENCOUNTER — Other Ambulatory Visit: Payer: Self-pay | Admitting: Obstetrics and Gynecology

## 2013-02-27 DIAGNOSIS — R928 Other abnormal and inconclusive findings on diagnostic imaging of breast: Secondary | ICD-10-CM

## 2013-03-06 ENCOUNTER — Other Ambulatory Visit: Payer: Medicaid Other

## 2013-03-15 ENCOUNTER — Other Ambulatory Visit: Payer: Medicaid Other

## 2013-03-16 ENCOUNTER — Other Ambulatory Visit: Payer: Medicaid Other

## 2013-03-21 ENCOUNTER — Ambulatory Visit
Admission: RE | Admit: 2013-03-21 | Discharge: 2013-03-21 | Disposition: A | Discharge status: Home / Self Care | Payer: Medicaid Other | Source: Ambulatory Visit | Attending: Obstetrics and Gynecology | Admitting: Obstetrics and Gynecology

## 2013-03-21 ENCOUNTER — Other Ambulatory Visit: Payer: Medicaid Other

## 2013-03-21 DIAGNOSIS — R928 Other abnormal and inconclusive findings on diagnostic imaging of breast: Secondary | ICD-10-CM

## 2013-03-23 ENCOUNTER — Ambulatory Visit: Payer: Medicaid Other | Admitting: Dietician

## 2013-09-07 ENCOUNTER — Other Ambulatory Visit: Payer: Self-pay | Admitting: Family Medicine

## 2013-09-07 ENCOUNTER — Ambulatory Visit
Admission: RE | Admit: 2013-09-07 | Discharge: 2013-09-07 | Disposition: A | Discharge status: Home / Self Care | Payer: Medicaid Other | Source: Ambulatory Visit | Attending: Family Medicine | Admitting: Family Medicine

## 2013-09-07 ENCOUNTER — Encounter (INDEPENDENT_AMBULATORY_CARE_PROVIDER_SITE_OTHER): Payer: Self-pay

## 2013-09-07 DIAGNOSIS — R52 Pain, unspecified: Secondary | ICD-10-CM

## 2013-10-26 ENCOUNTER — Emergency Department (HOSPITAL_COMMUNITY)
Admission: EM | Admit: 2013-10-26 | Discharge: 2013-10-26 | Disposition: A | Discharge status: Home / Self Care | Payer: Medicaid Other | Attending: Emergency Medicine | Admitting: Emergency Medicine

## 2013-10-26 ENCOUNTER — Encounter (HOSPITAL_COMMUNITY): Payer: Self-pay | Admitting: Emergency Medicine

## 2013-10-26 ENCOUNTER — Emergency Department (HOSPITAL_COMMUNITY): Payer: Medicaid Other

## 2013-10-26 DIAGNOSIS — Y9389 Activity, other specified: Secondary | ICD-10-CM | POA: Insufficient documentation

## 2013-10-26 DIAGNOSIS — Z8659 Personal history of other mental and behavioral disorders: Secondary | ICD-10-CM | POA: Diagnosis not present

## 2013-10-26 DIAGNOSIS — S20212A Contusion of left front wall of thorax, initial encounter: Secondary | ICD-10-CM

## 2013-10-26 DIAGNOSIS — S4980XA Other specified injuries of shoulder and upper arm, unspecified arm, initial encounter: Secondary | ICD-10-CM | POA: Diagnosis present

## 2013-10-26 DIAGNOSIS — S20219A Contusion of unspecified front wall of thorax, initial encounter: Secondary | ICD-10-CM | POA: Insufficient documentation

## 2013-10-26 DIAGNOSIS — Z79899 Other long term (current) drug therapy: Secondary | ICD-10-CM | POA: Diagnosis not present

## 2013-10-26 DIAGNOSIS — Z8719 Personal history of other diseases of the digestive system: Secondary | ICD-10-CM | POA: Diagnosis not present

## 2013-10-26 DIAGNOSIS — IMO0002 Reserved for concepts with insufficient information to code with codable children: Secondary | ICD-10-CM | POA: Diagnosis not present

## 2013-10-26 DIAGNOSIS — Z87891 Personal history of nicotine dependence: Secondary | ICD-10-CM | POA: Diagnosis not present

## 2013-10-26 DIAGNOSIS — Z8669 Personal history of other diseases of the nervous system and sense organs: Secondary | ICD-10-CM | POA: Diagnosis not present

## 2013-10-26 DIAGNOSIS — S46909A Unspecified injury of unspecified muscle, fascia and tendon at shoulder and upper arm level, unspecified arm, initial encounter: Secondary | ICD-10-CM | POA: Insufficient documentation

## 2013-10-26 DIAGNOSIS — Y92410 Unspecified street and highway as the place of occurrence of the external cause: Secondary | ICD-10-CM | POA: Insufficient documentation

## 2013-10-26 DIAGNOSIS — Z8739 Personal history of other diseases of the musculoskeletal system and connective tissue: Secondary | ICD-10-CM | POA: Diagnosis not present

## 2013-10-26 DIAGNOSIS — S46912A Strain of unspecified muscle, fascia and tendon at shoulder and upper arm level, left arm, initial encounter: Secondary | ICD-10-CM

## 2013-10-26 MED ORDER — OXYCODONE-ACETAMINOPHEN 5-325 MG PO TABS: 1.0000 | ORAL_TABLET | Freq: Four times a day (QID) | ORAL | Status: DC | PRN

## 2013-10-26 MED ORDER — CYCLOBENZAPRINE HCL 10 MG PO TABS: 10.0000 mg | ORAL_TABLET | Freq: Two times a day (BID) | ORAL | Status: DC | PRN
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
## 2013-10-26 NOTE — ED Notes (Signed)
Patient transported to X-ray 
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
## 2013-11-12 ENCOUNTER — Ambulatory Visit: Payer: No Typology Code available for payment source | Attending: Family Medicine

## 2013-11-12 ENCOUNTER — Ambulatory Visit: Payer: No Typology Code available for payment source

## 2013-11-25 ENCOUNTER — Ambulatory Visit (HOSPITAL_COMMUNITY)
Admission: RE | Admit: 2013-11-25 | Discharge: 2013-11-25 | Disposition: A | Discharge status: Home / Self Care | Payer: Medicaid Other | Attending: Psychiatry | Admitting: Psychiatry

## 2013-11-25 DIAGNOSIS — F329 Major depressive disorder, single episode, unspecified: Secondary | ICD-10-CM | POA: Diagnosis present

## 2013-11-25 DIAGNOSIS — F39 Unspecified mood [affective] disorder: Secondary | ICD-10-CM | POA: Diagnosis not present

## 2013-11-25 DIAGNOSIS — Z599 Problem related to housing and economic circumstances, unspecified: Secondary | ICD-10-CM | POA: Diagnosis not present

## 2013-11-25 DIAGNOSIS — R569 Unspecified convulsions: Secondary | ICD-10-CM | POA: Insufficient documentation

## 2013-11-25 DIAGNOSIS — K219 Gastro-esophageal reflux disease without esophagitis: Secondary | ICD-10-CM | POA: Insufficient documentation
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
## 2013-12-04 ENCOUNTER — Ambulatory Visit: Payer: Medicaid Other | Attending: Family Medicine

## 2013-12-04 DIAGNOSIS — Z5189 Encounter for other specified aftercare: Secondary | ICD-10-CM | POA: Insufficient documentation

## 2013-12-04 DIAGNOSIS — M545 Low back pain: Secondary | ICD-10-CM | POA: Insufficient documentation

## 2013-12-04 DIAGNOSIS — R5381 Other malaise: Secondary | ICD-10-CM | POA: Diagnosis not present

## 2013-12-12 ENCOUNTER — Encounter (HOSPITAL_COMMUNITY): Payer: Self-pay | Admitting: *Deleted

## 2013-12-12 ENCOUNTER — Inpatient Hospital Stay (HOSPITAL_COMMUNITY)
Admission: AD | Admit: 2013-12-12 | Discharge: 2013-12-14 | DRG: 885 | Disposition: A | Discharge status: Home / Self Care | Payer: Medicaid Other | Source: Intra-hospital | Attending: Psychiatry | Admitting: Psychiatry

## 2013-12-12 DIAGNOSIS — Z87891 Personal history of nicotine dependence: Secondary | ICD-10-CM

## 2013-12-12 DIAGNOSIS — K219 Gastro-esophageal reflux disease without esophagitis: Secondary | ICD-10-CM | POA: Diagnosis present

## 2013-12-12 DIAGNOSIS — R45851 Suicidal ideations: Secondary | ICD-10-CM | POA: Diagnosis present

## 2013-12-12 DIAGNOSIS — F4310 Post-traumatic stress disorder, unspecified: Secondary | ICD-10-CM | POA: Diagnosis present

## 2013-12-12 DIAGNOSIS — F332 Major depressive disorder, recurrent severe without psychotic features: Principal | ICD-10-CM | POA: Diagnosis present

## 2013-12-12 DIAGNOSIS — M1990 Unspecified osteoarthritis, unspecified site: Secondary | ICD-10-CM | POA: Diagnosis present

## 2013-12-12 DIAGNOSIS — G8929 Other chronic pain: Secondary | ICD-10-CM | POA: Diagnosis present

## 2013-12-12 DIAGNOSIS — Z91410 Personal history of adult physical and sexual abuse: Secondary | ICD-10-CM | POA: Diagnosis not present

## 2013-12-12 DIAGNOSIS — F319 Bipolar disorder, unspecified: Secondary | ICD-10-CM | POA: Diagnosis present

## 2013-12-12 DIAGNOSIS — F410 Panic disorder [episodic paroxysmal anxiety] without agoraphobia: Secondary | ICD-10-CM | POA: Diagnosis present

## 2013-12-12 DIAGNOSIS — Z599 Problem related to housing and economic circumstances, unspecified: Secondary | ICD-10-CM

## 2013-12-12 LAB — COMPREHENSIVE METABOLIC PANEL
ALT: 20 U/L (ref 0–35)
AST: 22 U/L (ref 0–37)
Albumin: 3.9 g/dL (ref 3.5–5.2)
Alkaline Phosphatase: 102 U/L (ref 39–117)
Anion gap: 12 (ref 5–15)
BUN: 16 mg/dL (ref 6–23)
CHLORIDE: 100 meq/L (ref 96–112)
CO2: 26 mEq/L (ref 19–32)
Calcium: 9.6 mg/dL (ref 8.4–10.5)
Creatinine, Ser: 0.8 mg/dL (ref 0.50–1.10)
GFR calc Af Amer: >90 mL/min (ref >90)
GFR calc non Af Amer: 88 mL/min — ABNORMAL LOW (ref >90)
Glucose, Bld: 108 mg/dL — ABNORMAL HIGH (ref 70–99)
POTASSIUM: 4.1 meq/L (ref 3.7–5.3)
Sodium: 138 mEq/L (ref 137–147)
Total Protein: 7.9 g/dL (ref 6.0–8.3)

## 2013-12-12 LAB — URINALYSIS, ROUTINE W REFLEX MICROSCOPIC
Bilirubin Urine: NEGATIVE
Glucose, UA: NEGATIVE mg/dL
HGB URINE DIPSTICK: NEGATIVE
Ketones, ur: NEGATIVE mg/dL
Nitrite: NEGATIVE
PROTEIN: NEGATIVE mg/dL
Specific Gravity, Urine: 1.008 (ref 1.005–1.030)
UROBILINOGEN UA: 0.2 mg/dL (ref 0.0–1.0)
pH: 6 (ref 5.0–8.0)

## 2013-12-12 LAB — CBC
HCT: 38.3 % (ref 36.0–46.0)
Hemoglobin: 12.5 g/dL (ref 12.0–15.0)
MCH: 26.6 pg (ref 26.0–34.0)
MCHC: 32.6 g/dL (ref 30.0–36.0)
MCV: 81.5 fL (ref 78.0–100.0)
PLATELETS: 315 10*3/uL (ref 150–400)
RBC: 4.7 MIL/uL (ref 3.87–5.11)
RDW: 13.9 % (ref 11.5–15.5)
WBC: 7.4 10*3/uL (ref 4.0–10.5)

## 2013-12-12 LAB — LIPID PANEL
Cholesterol: 198 mg/dL (ref 0–200)
HDL: 45 mg/dL (ref >39)
LDL Cholesterol: 118 mg/dL — ABNORMAL HIGH (ref 0–99)
TRIGLYCERIDES: 173 mg/dL — AB (ref <150)
Total CHOL/HDL Ratio: 4.4 RATIO
VLDL: 35 mg/dL (ref 0–40)

## 2013-12-12 LAB — URINE MICROSCOPIC-ADD ON

## 2013-12-12 LAB — TSH: TSH: 0.651 u[IU]/mL (ref 0.350–4.500)

## 2013-12-12 MED ORDER — MELOXICAM 15 MG PO TABS
15.0000 mg | ORAL_TABLET | Freq: Every day | ORAL | Status: DC
  Administered 2013-12-12 – 2013-12-14 (×2): 15 mg via ORAL
  Filled 2013-12-12 (×2): qty 1
  Filled 2013-12-12: qty 2
  Filled 2013-12-12: qty 1
  Filled 2013-12-12: qty 4
  Filled 2013-12-12: qty 1

## 2013-12-12 MED ORDER — TRAZODONE HCL 50 MG PO TABS
50.0000 mg | ORAL_TABLET | Freq: Every evening | ORAL | Status: DC | PRN
  Administered 2013-12-12: 50 mg via ORAL
  Filled 2013-12-12: qty 4
  Filled 2013-12-12: qty 1

## 2013-12-12 MED ORDER — ALUM & MAG HYDROXIDE-SIMETH 200-200-20 MG/5ML PO SUSP
30.0000 mL | ORAL | Status: DC | PRN
  Administered 2013-12-12: 30 mL via ORAL

## 2013-12-12 MED ORDER — LAMOTRIGINE 25 MG PO TABS
25.0000 mg | ORAL_TABLET | Freq: Every day | ORAL | Status: DC
  Administered 2013-12-12 – 2013-12-14 (×3): 25 mg via ORAL
  Filled 2013-12-12 (×4): qty 1
  Filled 2013-12-12: qty 4
  Filled 2013-12-12 (×3): qty 1

## 2013-12-12 MED ORDER — LITHIUM CARBONATE ER 450 MG PO TBCR
450.0000 mg | EXTENDED_RELEASE_TABLET | Freq: Two times a day (BID) | ORAL | Status: DC
  Administered 2013-12-12 – 2013-12-14 (×4): 450 mg via ORAL
  Filled 2013-12-12: qty 1
  Filled 2013-12-12: qty 8
  Filled 2013-12-12 (×8): qty 1
  Filled 2013-12-12: qty 8
  Filled 2013-12-12 (×2): qty 1

## 2013-12-12 MED ORDER — CYCLOBENZAPRINE HCL 10 MG PO TABS
10.0000 mg | ORAL_TABLET | Freq: Three times a day (TID) | ORAL | Status: DC | PRN
  Administered 2013-12-12 – 2013-12-14 (×4): 10 mg via ORAL
  Filled 2013-12-12 (×4): qty 1

## 2013-12-12 MED ORDER — ACETAMINOPHEN 325 MG PO TABS
650.0000 mg | ORAL_TABLET | Freq: Four times a day (QID) | ORAL | Status: DC | PRN
  Administered 2013-12-14: 650 mg via ORAL
  Filled 2013-12-12: qty 2

## 2013-12-12 MED ORDER — MAGNESIUM HYDROXIDE 400 MG/5ML PO SUSP: 30.0000 mL | Freq: Every day | ORAL | Status: DC | PRN

## 2013-12-12 NOTE — Tx Team (Signed)
Initial Interdisciplinary Treatment Plan   PATIENT STRESSORS: Medication change or noncompliance   PROBLEM LIST: Problem List/Patient Goals Date to be addressed Date deferred Reason deferred Estimated date of resolution  depression 12/12/2013     Self harm thoughts  12/12/2013                                                DISCHARGE CRITERIA:  Improved stabilization in mood, thinking, and/or behavior Need for constant or close observation no longer present Reduction of life-threatening or endangering symptoms to within safe limits  PRELIMINARY DISCHARGE PLAN: Outpatient therapy Return to previous living arrangement  PATIENT/FAMIILY INVOLVEMENT: This treatment plan has been presented to and reviewed with the patient, Brittney [name].  The patient and family have been given the opportunity to ask questions and make suggestions.  Marilynne Halsted M 12/12/2013, 2:36 PM

## 2013-12-12 NOTE — Progress Notes (Signed)
Patient reports that the medications she has been prescribed is lithium 300 mg BID and reports " that I have not had my medications "; patient also reports that tales flexeril and meloxicam but that she does not know the dosages; patient reports that she has lower back pain and that it why the medication is prescribed; patient reports that " I have been having increased depression because I stopped taking my medication because I don't want to be fat; patient was cooperative during admission; Dr. Sabra Heck was notified that patient was on the unit and that she needed orders; report was given to Kevan Ny RN

## 2013-12-12 NOTE — BH Assessment (Signed)
Assessment Note  OUIDA [name] is an 43 y.o. female who came to [facility] accompanied by her therapist Despina Arias from [facility]. Pt says that she has been off her medications since June, and has on-going suicidal ideations, so she wants to go back on her medication. Pt stopped taking her medications due to side effects (weight gain, anxiety, etc.). She gets prescriptions from Dr. Sabra Heck She says that she tends to have some seasonal depression, and she "doesn't currently feel safe". She admits to feeling suicidal,  with plan and/or intent. She has a suicidal plan to overdose on medications, go to her deceased mothers grave, and lay on the top of her mothers grave until she passes away. She does have a history of two overdose's, last overdose was in 2012. She denies HI, A/V hallucinations or SA.   Pt has casual appearance, restless movement (because her back was hurting after driving at work today), and her thought process was logical and coherent. She does not appear to be responding to internal stimuli.   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    Writer informed by [facility] that patient was already accepted prior to assessment by Dr. Sabra Heck. The bed assignment is 301-2. Writer discussed plan with patient.  Patient was agreeable. Pt admits that she is unable to contract for safety and needs help. Therapist Despina Arias agrees with disposition.     Axis I: Bipolar, Depressed Axis II: Deferred Axis III:  Past Medical History  Diagnosis Date  . Anxiety   . GERD (gastroesophageal reflux disease)   . 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 Headache(784.0)     MIGRAINES  . Arthritis     KNEES ELBOWS HIPS SHOULDER FINGERS  . Depression     BIPOLAR   Axis IV: other psychosocial or environmental problems, problems related to social environment, problems with access to health care services and problems with primary support  group Axis V: 31-40 impairment in reality testing  Past Medical History:  Past Medical History  Diagnosis Date  . Anxiety   . GERD (gastroesophageal reflux disease)   . 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 Headache(784.0)     MIGRAINES  . Arthritis     KNEES ELBOWS HIPS SHOULDER FINGERS  . Depression     BIPOLAR    Past Surgical History  Procedure Laterality Date  . Laparoscopy N/A 11/21/2012    Procedure: LAPAROSCOPY OPERATIVE REMOVAL OF RIGHT TUBE AND OVARY AND FLUID FROM MASS;  Surgeon: Melina Schools, MD;  Location: [facility];  Service: Gynecology;  Laterality: N/A;  1 1/2hrs OR time    Family History: History reviewed. No pertinent family history.  Social History:  reports that she has quit smoking. She does not have any smokeless tobacco history on file. She reports that she does not drink alcohol or use illicit drugs.  Additional Social History:  Alcohol / Drug Use Pain Medications: SEE MAR Prescriptions: SEE MAR Over the Counter: SEE MAR History of alcohol / drug use?: No history of alcohol / drug abuse  CIWA: CIWA-Ar BP: 119/77 mmHg Pulse Rate: 87 COWS:    Allergies:  Allergies  Allergen Reactions  . Nsaids     REACTION: edema   Pt says she can take ibuprofen    Home Medications:  Medications Prior to Admission  Medication  Sig Dispense Refill  . cholecalciferol (VITAMIN D) 1000 UNITS tablet Take 2,000 Units by mouth daily.      . cyclobenzaprine (FLEXERIL) 10 MG tablet Take 1 tablet (10 mg total) by mouth 2 (two) times daily as needed for muscle spasms.  10 tablet  0  . fluticasone (FLONASE) 50 MCG/ACT nasal spray Place 1 spray into both nostrils daily.      . Multiple Vitamin (MULTIVITAMIN WITH MINERALS) TABS tablet Take 1 tablet by mouth daily.      Marland Kitchen oxyCODONE-acetaminophen (PERCOCET/ROXICET) 5-325 MG per tablet Take 1-2 tablets by mouth every 6 (six) hours as needed for severe pain.  10 tablet  0  . vitamin B-12 (CYANOCOBALAMIN) 1000 MCG  tablet Take 2,500 mcg by mouth daily.        OB/GYN Status:  No LMP recorded. Patient is postmenopausal.  General Assessment Data Location of Assessment: [facility] Is this a Tele or Face-to-Face Assessment?: Face-to-Face Is this an Initial Assessment or a Re-assessment for this encounter?: Initial Assessment Living Arrangements: Children Can pt return to current living arrangement?: Yes Admission Status: Voluntary Is patient capable of signing voluntary admission?: Yes Transfer from: [facility] Referral Source: Other (therapist-Amanda Owens Shark)     [place] Living Arrangements: Children Name of Psychiatrist: [place] Name of Therapist: Despina Arias  Education Status Is patient currently in school?: No  Risk to self with the past 6 months Suicidal Ideation: Yes-Currently Present Suicidal Intent: Yes-Currently Present Is patient at risk for suicide?: Yes Suicidal Plan?: Yes-Currently Present Specify Current Suicidal Plan:  (OD on pills and lay on deceased mothers grave) Access to Means: Yes Specify Access to Suicidal Means:  (access to medications ) What has been your use of drugs/alcohol within the last 12 months?:  (pt denies alcohol and drug use ) Previous Attempts/Gestures: Yes How many times?:  (2 prior attempts ) Other Self Harm Risks:  (none reported) Triggers for Past Attempts: Other (Comment) (depression, stress, relationships, etc.) Intentional Self Injurious Behavior: None Family Suicide History: Yes (Father-[employer]) Recent stressful life event(s): Other (Comment) Persecutory voices/beliefs?: No Depression: Yes Depression Symptoms: Feeling angry/irritable;Feeling worthless/self pity;Loss of interest in usual pleasures;Guilt;Fatigue;Isolating;Tearfulness;Insomnia;Despondent Substance abuse history and/or treatment for substance abuse?: No Suicide prevention information given to non-admitted patients: Not applicable  Risk to Others within  the past 6 months Homicidal Ideation: No Thoughts of Harm to Others: No Current Homicidal Intent: No Current Homicidal Plan: No Access to Homicidal Means: No Identified Victim:  (n/a) History of harm to others?: No Assessment of Violence: None Noted Violent Behavior Description:  (patient is calm and cooperative ) Does patient have access to weapons?: No Criminal Charges Pending?: No Does patient have a court date: No  Psychosis Hallucinations: None noted Delusions: None noted  Mental Status Report Appear/Hygiene: Disheveled Eye Contact: Good Motor Activity: Freedom of movement Speech: Logical/coherent Level of Consciousness: Restless;Irritable Mood: Depressed Affect: Depressed;Sad Anxiety Level: Severe Thought Processes: Coherent;Relevant Judgement: Impaired Orientation: Person;Place;Time;Situation Obsessive Compulsive Thoughts/Behaviors: None  Cognitive Functioning Concentration: Normal Memory: Recent Intact;Remote Intact IQ: Average Insight: Good Impulse Control: Fair Appetite: Fair Weight Loss:  (none reported) Weight Gain:  (none reported ) Sleep: No Change Total Hours of Sleep:  (varies ) Vegetative Symptoms: None  ADLScreening [facility] Assessment Services) Patient's cognitive ability adequate to safely complete daily activities?: Yes Patient able to express need for assistance with ADLs?: Yes Independently performs ADLs?: Yes (appropriate for developmental age)  Prior Inpatient Therapy Prior Inpatient Therapy: Yes Prior Therapy Dates:  (  01/04/2014 and other inpatient date unk) Prior Therapy Facilty/Provider(s):  [facility]) Reason for Treatment:  (suicide attempt)  Prior Outpatient Therapy Prior Outpatient Therapy: Yes Prior Therapy Dates: ongoing Prior Therapy Facilty/Provider(s): Dr. Erlene Quan Reason for Treatment: depression  ADL Screening (condition at time of admission) Patient's cognitive ability adequate to safely complete daily activities?: Yes Is  the patient deaf or have difficulty hearing?: No Does the patient have difficulty seeing, even when wearing glasses/contacts?: No Does the patient have difficulty concentrating, remembering, or making decisions?: No Patient able to express need for assistance with ADLs?: Yes Does the patient have difficulty dressing or bathing?: No Independently performs ADLs?: Yes (appropriate for developmental age) Does the patient have difficulty walking or climbing stairs?: No Weakness of Legs: None Weakness of Arms/Hands: None  Home Assistive Devices/Equipment Home Assistive Devices/Equipment: None  Therapy Consults (therapy consults require a physician order) PT Evaluation Needed: No OT Evalulation Needed: No SLP Evaluation Needed: No Abuse/Neglect Assessment (Assessment to be complete while patient is alone) Physical Abuse: Denies Verbal Abuse: Denies Sexual Abuse: Denies Exploitation of patient/patient's resources: Denies Self-Neglect: Denies Values / Beliefs Cultural Requests During Hospitalization: None Spiritual Requests During Hospitalization: None Consults Spiritual Care Consult Needed: No Social Work Consult Needed: No Regulatory affairs officer (For Healthcare) Does patient have an advance directive?: Yes Would patient like information on creating an advanced directive?: No - patient declined information Type of Advance Directive: Living will Does patient want to make changes to advanced directive?: No - Patient declined Copy of advanced directive(s) in chart?: No - copy requested (informed Kevan Ny RN that it was requested in report) Nutrition Screen- [place] Adult/WL/AP Patient's home diet: Regular  Additional Information 1:1 In Past 12 Months?: No CIRT Risk: No Elopement Risk: No Does patient have medical clearance?: No     Disposition:  Disposition Initial Assessment Completed for this Encounter: Yes Disposition of Patient: Inpatient treatment program (Patient accepted to [facility]  by Dr. Sabra Heck Adele [facility] 301-2) Type of inpatient treatment program: Adult  On Site Evaluation by:   Reviewed with Physician:    Waldon Merl [facility] 12/12/2013 3:31 PM

## 2013-12-12 NOTE — [facility] Group Notes (Signed)
[place] LCSW Group Therapy 12/12/2013  1:15 PM   Type of Therapy: Group Therapy  Participation Level: Did Not Attend for unknown reason.   Tilden Fossa, MSW, [place] [facility] [phone]

## 2013-12-13 DIAGNOSIS — F3130 Bipolar disorder, current episode depressed, mild or moderate severity, unspecified: Secondary | ICD-10-CM

## 2013-12-13 DIAGNOSIS — F4310 Post-traumatic stress disorder, unspecified: Secondary | ICD-10-CM | POA: Diagnosis present

## 2013-12-13 LAB — LITHIUM LEVEL: LITHIUM LVL: 0.54 meq/L — AB (ref 0.80–1.40)
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
## 2013-12-13 NOTE — BHH Group Notes (Signed)
[facility] Group Notes:  (Nursing/MHT/Case Management/Adjunct)  Date:  12/13/2013  Time:  0900 am  Type of Therapy:  Psychoeducational Skills  Participation Level:  Did Not Attend   Brittney [name] 12/13/2013, 1:06 PM

## 2013-12-13 NOTE — BHH Group Notes (Signed)
[facility] LCSW Group Therapy  [facility] 1:15 - 2:30 PM  12/13/2013 2:57 PM  Type of Therapy:  Group Therapy  Participation Level:  Did Not Attend - patient refused.  Summary of Progress/Problems:  Concha Pyo 12/13/2013, 2:57 PM

## 2013-12-13 NOTE — Progress Notes (Signed)
D   Pt was irritable and became angry and wanted to argue about why she was here and the paper did not say what it actually says   She had earlier requested to use the phone during group time and was denied and explained rule to patient and reasons why no phone calls during group  Pt was glaring and staring at me when i walked down the hall   She finally said her care was not being personalized because she couldn't call her 44 year old daughter  A   Pt was allowed to call her daughter    Verbal support given   Medications administered and effectiveness monitored   Q 15 min checks R   Pt apologized for her behaviors and said she doesn't usually act like that    She is safe at present

## 2013-12-13 NOTE — Plan of Care (Signed)
Problem: Ineffective individual coping Goal: STG: Patient will remain free from self harm Outcome: Progressing 15 minute continually completed per protocol for patient safety.  Problem: Diagnosis: Increased Risk For Suicide Attempt Goal: STG-Patient Will Attend All Groups On The Unit Outcome: Not Progressing Patient not attending groups today and remains isolative in bed the majority of the day. RN provided group schedule to pt.  Problem: Alteration in mood Goal: LTG-Patient reports reduction in suicidal thoughts (Patient reports reduction in suicidal thoughts and is able to verbalize a safety plan for whenever patient is feeling suicidal)  Outcome: Progressing Patient denies suicidal thoughts today.

## 2013-12-13 NOTE — Progress Notes (Signed)
D: Patient is alert and oriented. Pt's mood is depressed and affect is sullen and sad. Pt reports that she reviewed [facility] agenda and does not believe she will get anything out of being at [facility]. Pt requested and signed 72 hour discharge request form at 0856 12/13/2013. Pt remains isolative in bed today. Pt reports muscle spasm pain in her lower back 5/10. Pt denies SI/HI and AVH at this time. Pt not attending groups. A: PRN medications administered per providers orders for muscle spasms (See MAR). Medications administered per providers orders (See MAR). 15 minute checks completed per protocol for pt safety. R: Pt receptive to nursing interventions.
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
## 2013-12-13 NOTE — BHH Suicide Risk Assessment (Signed)
[facility] INPATIENT:  Family/Significant Other Suicide Prevention Education  Suicide Prevention Education:  Patient Refusal for Family/Significant Other Suicide Prevention Education: The patient Brittney [name] has refused to provide written consent for family/significant other to be provided Family/Significant Other Suicide Prevention Education during admission and/or prior to discharge.  Physician notified.  Patient advised of not having phone numbers.  Concha Pyo 12/13/2013, 1:07 PM

## 2013-12-13 NOTE — Clinical Social Work Note (Signed)
CSW spoke with Marthenia Rolling, Clinical Director [facility] who advised they will no longer be able to provide service to patient.  He shared patient's needs are greater than they are able to meet.

## 2013-12-14 DIAGNOSIS — F4310 Post-traumatic stress disorder, unspecified: Secondary | ICD-10-CM

## 2013-12-14 MED ORDER — TRAZODONE HCL 50 MG PO TABS: 50.0000 mg | ORAL_TABLET | Freq: Every evening | ORAL | Status: DC | PRN

## 2013-12-14 MED ORDER — FLUTICASONE PROPIONATE 50 MCG/ACT NA SUSP: 1.0000 | Freq: Every day | NASAL | Status: DC

## 2013-12-14 MED ORDER — CYCLOBENZAPRINE HCL 10 MG PO TABS
10.0000 mg | ORAL_TABLET | Freq: Three times a day (TID) | ORAL | Status: DC
  Filled 2013-12-14: qty 12
  Filled 2013-12-14: qty 1
  Filled 2013-12-14: qty 12
  Filled 2013-12-14 (×3): qty 1
  Filled 2013-12-14: qty 12
  Filled 2013-12-14 (×2): qty 1

## 2013-12-14 MED ORDER — LAMOTRIGINE 25 MG PO TABS: 25.0000 mg | ORAL_TABLET | Freq: Every day | ORAL | Status: DC

## 2013-12-14 MED ORDER — CYCLOBENZAPRINE HCL 10 MG PO TABS: 10.0000 mg | ORAL_TABLET | Freq: Three times a day (TID) | ORAL | Status: DC

## 2013-12-14 MED ORDER — MELOXICAM 7.5 MG PO TABS
15.0000 mg | ORAL_TABLET | Freq: Every day | ORAL | Status: DC
  Filled 2013-12-14: qty 8

## 2013-12-14 MED ORDER — MELOXICAM 15 MG PO TABS: 15.0000 mg | ORAL_TABLET | Freq: Every day | ORAL | Status: DC

## 2013-12-14 MED ORDER — LITHIUM CARBONATE ER 450 MG PO TBCR
450.0000 mg | EXTENDED_RELEASE_TABLET | Freq: Two times a day (BID) | ORAL | Status: DC
Start: 2013-12-14 — End: 2014-05-30
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
## 2013-12-14 NOTE — Progress Notes (Signed)
D   Observed pt briefly with her visitor interacting  She appears sad and anxious   After visitation she went to bed and did not get up to get her medications even though she was prompted to do so   She appeared lethargic and said she was real sleepy    A   Verbal support given   Medications administered and effectiveness monitored   Q 15 min checks R   Pt safe at present

## 2013-12-14 NOTE — BHH Group Notes (Signed)
[facility] LCSW Aftercare Discharge Planning Group Note   12/14/2013 10:19 AM  Participation Quality:  Patient did not attend group - patient refused.  Deberah Adolf, Eulas Post

## 2013-12-14 NOTE — Progress Notes (Signed)
[facility] Adult Case Management Discharge Plan :  Will you be returning to the same living situation after discharge: Yes,  Patient is returning to her home. At discharge, do you have transportation home?:Yes,  Patient is arranging transportation home. Do you have the ability to pay for your medications:Yes,  Patient has Medicaid.  Release of information consent forms completed and in the chart;  Patient's signature needed at discharge.  Patient to Follow up at: Follow-up Information   Follow up with  [facility] On 12/19/2013. (You are scheduled with Altha Harm on Wednesday, November 4,2015 at [facility])    Contact information:   [place],    [id number]  [phone]      Follow up with [facility] On 01/04/2014. (You are scheduled with Dr. Darleene Cleaver on Friday, January 04, 2014 at [facility])    Contact information:   [place], [address]  [phone]      Patient denies SI/HI:  Patient no longer endorsing SI/HI or other thoughts of self harm.   Safety Planning and Suicide Prevention discussed: .Reviewed with all patients individually. Cyanna Neace Hairston 12/14/2013, 12:10 PM

## 2013-12-14 NOTE — Progress Notes (Signed)
Adult Psychoeducational Group Note  Date:  12/14/2013 Time:  1:33 PM  Group Topic/Focus:  Goals Group:   The focus of this group is to help patients establish daily goals to achieve during treatment and discuss how the patient can incorporate goal setting into their daily lives to aide in recovery.  Participation Level:  Did Not Attend  Participation Quality:  did not attend  Affect:  did not attend  Cognitive:  did not attend  Insight: None  Engagement in Group:  did not attend  Modes of Intervention:  Discussion  Additional Comments:  Pt did not attend group.  Yukie Bergeron, Clyda Hurdle 12/14/2013, 1:33 PM

## 2013-12-14 NOTE — Progress Notes (Signed)
D MD completed DC order, DC SRA and arranged for sample meds to go to pt, from pharmacy. Pt completed morning assessment and on it she writes she denies SI within the past 24 hrs, she rated her depression, hopelessness and anxiety "3/0/0".She replied " nothing" when this  Writer asked her something she could identify she had learned during her stay here . A All pt belongings are returned to  Pt and pt signed release of returned property  And then pt was given D C AVS, and stated she understand these instructions and can and will comply.  R Goals met. Pt6 engaged in [place] and treatmetn goals accomplished. Ptdc'd home.

## 2013-12-16 MED ORDER — FLUTICASONE PROPIONATE 50 MCG/ACT NA SUSP: 1.0000 | Freq: Every day | NASAL | Status: DC
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
## 2013-12-18 NOTE — Progress Notes (Signed)
Patient Discharge Instructions:  After Visit Summary (AVS):   Faxed to:  12/18/13 Discharge Summary Note:   Faxed to:  12/18/13 Psychiatric Admission Assessment Note:   Faxed to:  12/18/13 Suicide Risk Assessment - Discharge Assessment:   Faxed to:  12/18/13 Faxed/Sent to the Next Level Care provider:  12/18/13 Faxed to [facility], 12/18/2013, 4:00 PM

## 2013-12-28 NOTE — Clinical Social Work Note (Signed)
Call from patient who was very irate asking for assistance with medications.  Patient requested MD provider her with an additional prescription for medications as she will be running out before she can see provider she was referred to prior to discharge.  Patient was informed MD would not be able to provide her with a prescription as it has been a few weeks since she discharge.  She was angry that CSW had informed her, during hospitalization, that her previous provider, [facility] was discharged her from their service.  CSW scheduled with with [facility] for medication/counsoeling.  She very angrily stated the referral was inappropriate due to going to the appointment and having to wait for 45 minutes to be seen while NP ate lunch.  Patient was asked to calm down and we would see if there were other option of providers who work with Medicaid.  Patient stated she was not suicidal and settled down while CSW consulted with office mates.  CSW asked former patient how much medication she had to which she responded two weeks.  She was then asked to give writer until Monday, 11/16 to check on other provider.  She was assured CSW would call her back but could not guarantee she would be able to get in to see someone who would schedule an appointment.  She was informed of the walk-in clinics at [facility] to which she responded her anxiety would not allow her to sit and wait to be seen.  The call ended with patient settled and agreeable to a call back on Monday.  CSW then contacted [facility] and was informed they are taking new patients and will work with patient's Medicaid.  Patient was called back and advised [facility] having a public event tomorrow and she could begin the process of being seen as CSW did not have consent to release patient's.  CSW consulted with lead social worker who suggested patient contact [facility] also as an option.   Patient called back with the information.  Patient was settled down and pleasant.  She denied any SI/HI.

## 2013-12-29 ENCOUNTER — Encounter (HOSPITAL_COMMUNITY): Payer: Self-pay | Admitting: *Deleted

## 2013-12-29 ENCOUNTER — Emergency Department (HOSPITAL_COMMUNITY)
Admission: EM | Admit: 2013-12-29 | Discharge: 2013-12-29 | Disposition: A | Discharge status: Home / Self Care | Payer: Medicaid Other | Attending: Emergency Medicine | Admitting: Emergency Medicine

## 2013-12-29 ENCOUNTER — Emergency Department (HOSPITAL_COMMUNITY): Payer: Medicaid Other

## 2013-12-29 DIAGNOSIS — Z87891 Personal history of nicotine dependence: Secondary | ICD-10-CM | POA: Insufficient documentation

## 2013-12-29 DIAGNOSIS — Z7951 Long term (current) use of inhaled steroids: Secondary | ICD-10-CM | POA: Insufficient documentation

## 2013-12-29 DIAGNOSIS — Z8739 Personal history of other diseases of the musculoskeletal system and connective tissue: Secondary | ICD-10-CM | POA: Insufficient documentation

## 2013-12-29 DIAGNOSIS — Z8679 Personal history of other diseases of the circulatory system: Secondary | ICD-10-CM | POA: Diagnosis not present

## 2013-12-29 DIAGNOSIS — Z791 Long term (current) use of non-steroidal anti-inflammatories (NSAID): Secondary | ICD-10-CM | POA: Diagnosis not present

## 2013-12-29 DIAGNOSIS — F319 Bipolar disorder, unspecified: Secondary | ICD-10-CM | POA: Diagnosis not present

## 2013-12-29 DIAGNOSIS — R079 Chest pain, unspecified: Secondary | ICD-10-CM | POA: Diagnosis present

## 2013-12-29 DIAGNOSIS — Z8719 Personal history of other diseases of the digestive system: Secondary | ICD-10-CM | POA: Insufficient documentation

## 2013-12-29 DIAGNOSIS — Z79899 Other long term (current) drug therapy: Secondary | ICD-10-CM | POA: Diagnosis not present

## 2013-12-29 DIAGNOSIS — F419 Anxiety disorder, unspecified: Secondary | ICD-10-CM | POA: Diagnosis not present

## 2013-12-29 DIAGNOSIS — G40909 Epilepsy, unspecified, not intractable, without status epilepticus: Secondary | ICD-10-CM | POA: Diagnosis not present

## 2013-12-29 LAB — CBC
HCT: 59.7 % — ABNORMAL HIGH (ref 36.0–46.0)
Hemoglobin: 19.5 g/dL — ABNORMAL HIGH (ref 12.0–15.0)
MCH: 26.7 pg (ref 26.0–34.0)
MCHC: 32.7 g/dL (ref 30.0–36.0)
MCV: 81.9 fL (ref 78.0–100.0)
PLATELETS: 116 10*3/uL — AB (ref 150–400)
RBC: 7.29 MIL/uL — AB (ref 3.87–5.11)
RDW: 15 % (ref 11.5–15.5)
WBC: 3.9 10*3/uL — AB (ref 4.0–10.5)

## 2013-12-29 LAB — BASIC METABOLIC PANEL
ANION GAP: 14 (ref 5–15)
BUN: 14 mg/dL (ref 6–23)
CHLORIDE: 101 meq/L (ref 96–112)
CO2: 23 meq/L (ref 19–32)
Calcium: 9.3 mg/dL (ref 8.4–10.5)
Creatinine, Ser: 0.95 mg/dL (ref 0.50–1.10)
GFR calc Af Amer: 83 mL/min — ABNORMAL LOW (ref >90)
GFR calc non Af Amer: 72 mL/min — ABNORMAL LOW (ref >90)
Glucose, Bld: 120 mg/dL — ABNORMAL HIGH (ref 70–99)
POTASSIUM: 4.1 meq/L (ref 3.7–5.3)
Sodium: 138 mEq/L (ref 137–147)

## 2013-12-29 LAB — I-STAT TROPONIN, ED: Troponin i, poc: 0.02 ng/mL (ref 0.00–0.08)
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
## 2013-12-29 NOTE — Discharge Instructions (Signed)
Your hemoglobin or red blood cells are higher than normal. This will need to be rechecked by her primary care physician. Return if worse

## 2013-12-29 NOTE — ED Notes (Signed)
Pt. Refused wheelchair 
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
## 2014-03-28 ENCOUNTER — Ambulatory Visit: Payer: Medicaid Other | Admitting: Neurology

## 2014-03-28 ENCOUNTER — Telehealth: Payer: Self-pay | Admitting: Neurology

## 2014-03-28 NOTE — Telephone Encounter (Signed)
This patient canceled her appointment one hour prior to appointment time, indicating she had the flu.

## 2014-04-02 ENCOUNTER — Encounter: Payer: Self-pay | Admitting: Neurology

## 2014-05-30 ENCOUNTER — Ambulatory Visit (INDEPENDENT_AMBULATORY_CARE_PROVIDER_SITE_OTHER): Payer: Medicaid Other | Admitting: Neurology

## 2014-05-30 ENCOUNTER — Encounter: Payer: Self-pay | Admitting: Neurology

## 2014-05-30 VITALS — BP 107/73 | HR 78 | Ht 68.0 in | Wt 230.8 lb

## 2014-05-30 DIAGNOSIS — G932 Benign intracranial hypertension: Secondary | ICD-10-CM

## 2014-05-30 DIAGNOSIS — G43009 Migraine without aura, not intractable, without status migrainosus: Secondary | ICD-10-CM

## 2014-05-30 HISTORY — DX: Benign intracranial hypertension: G93.2

## 2014-05-30 MED ORDER — TOPIRAMATE 25 MG PO TABS: ORAL_TABLET | ORAL | Status: DC

## 2014-05-30 NOTE — Progress Notes (Signed)
Reason for visit: Face numbness  Referring physician: Dr. Hal Neer Brittney [name] is a 44 y.o. female  History of present illness:  [name]) is a 44 year old right-handed black female with a history of post traumatic stress disorder. Medical records provided were reviewed. The patient was given the diagnosis of pseudotumor cerebri at [facility] in 2009. The patient indicates that she underwent a lumbar puncture and was told that the opening pressure was quite high. She was having headaches at that time, with episodes of loss of vision. She was treated with medication, but she cannot remember the name of the medication. The patient indicates that she improved. In September 2015, it was felt that the lithium that she was on for bipolar disorder was the etiology of her pseudotumor. The patient did not do well off of the lithium, and she went back on it for about 2 months, with return of symptoms. The patient has been off of the lithium since November 2015. She has had other events associated with numbness of the right face spreading to the left face lasting few seconds to 1-1/2 minutes. She may have some difficulty talking during the events. She has pulsatile tinnitus. The episodes described above may occur 2 or 3 times an hour, always occurring with standing. The patient will have 2 or 3 headaches a month lasting 2 or 3 hours. She denies any blackout episodes. She has no numbness or weakness on extremities. She denies any falls. She does have some slight neck stiffness. She has a history of fibromyalgia. She comes to the office today for an evaluation.  Past Medical History  Diagnosis Date  . Anxiety   . GERD (gastroesophageal reflux disease)   . 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 Headache(784.0)     MIGRAINES  . Arthritis     KNEES ELBOWS HIPS SHOULDER FINGERS  . Depression     BIPOLAR  . Bipolar disorder   . Obesity   . OA (osteoarthritis) of knee   .  Asthma   . Dyspnea   . Back pain   . Neck pain   . Hyperlipidemia   . Fibromyalgia   . 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    Past Surgical History  Procedure Laterality Date  . Laparoscopy N/A 11/21/2012    Procedure: LAPAROSCOPY OPERATIVE REMOVAL OF RIGHT TUBE AND OVARY AND FLUID FROM MASS;  Surgeon: Melina Schools, MD;  Location: [facility];  Service: Gynecology;  Laterality: N/A;  1 1/2hrs OR time  . Oophorectomy    . Cardiac catheterization  2010  . Dilation and curettage of uterus  1994    Family History  Problem Relation Age of Onset  . Cancer - Lung Father   . Diabetes Father   . Cirrhosis Mother   . Heart disease Maternal Grandmother   . Alzheimer's disease Maternal Grandfather   . Cancer - Lung Paternal Grandmother   . Migraines Sister   . Stroke Sister   . Healthy Brother   . Healthy Sister   . Healthy Sister   . Healthy Sister   . Healthy Brother   . Healthy Brother   . Healthy Brother   . Healthy Brother   . Healthy Brother     Social history:  reports that she quit smoking about 11 years ago. She has never used smokeless tobacco. She reports that she drinks alcohol. She reports that she does not use illicit drugs.  Medications:  Prior to Admission medications   Medication Sig Start Date End Date Taking? Authorizing Provider  acetaminophen (TYLENOL) 325 MG tablet Take 650 mg by mouth every 6 (six) hours as needed.   Yes Historical Provider, MD  cyclobenzaprine (FLEXERIL) 10 MG tablet Take 1 tablet (10 mg total) by mouth 3 (three) times daily. 12/14/13  Yes Kerrie Buffalo, NP  fluticasone (FLONASE) 50 MCG/ACT nasal spray Place 1 spray into both nostrils daily. 12/16/13  Yes Kerrie Buffalo, NP  meloxicam (MOBIC) 15 MG tablet Take 1 tablet (15 mg total) by mouth at bedtime. 12/14/13  Yes Kerrie Buffalo, NP  pregabalin (LYRICA) 50 MG capsule Take 50 mg by mouth daily.   Yes Historical Provider, MD      Allergies  Allergen Reactions  . Nsaids     REACTION:  edema   Pt says she can take ibuprofen    ROS:  Out of a complete 14 system review of symptoms, the patient complains only of the following symptoms, and all other reviewed systems are negative.  Fatigue Dizziness Itching Eye pain, shortness of breath Constipation Joint pain, muscle cramps, aching muscles Allergies Memory loss, confusion, headache, dizziness, seizures Decreased energy  Blood pressure 107/73, pulse 78, height 5\' 8"  (1.727 m), weight 230 lb 12.8 oz (104.69 kg).   Blood pressure, standing, right arm is 118/84. Blood pressure, sitting, right arm is 120/86.  Physical Exam  General: The patient is alert and cooperative at the time of the examination. The patient is moderately obese.  Eyes: Pupils are equal, round, and reactive to light. Discs appear to have slightly blurred margins, particularly medially. No venous pulsations are seen.  Neck: The neck is supple, no carotid bruits are noted.  Respiratory: The respiratory examination is clear.  Cardiovascular: The cardiovascular examination reveals a regular rate and rhythm, no obvious murmurs or rubs are noted.  Skin: Extremities are without significant edema.  Neurologic Exam  Mental status: The patient is alert and oriented x 3 at the time of the examination. The patient has apparent normal recent and remote memory, with an apparently normal attention span and concentration ability.  Cranial nerves: Facial symmetry is present. There is good sensation of the face to pinprick and soft touch on the left, decreased on the right. The patient splits the midline with vibration sensation, decreased on the right. The strength of the facial muscles and the muscles to head turning and shoulder shrug are normal bilaterally. Speech is well enunciated, no aphasia or dysarthria is noted. Extraocular movements are full. Visual fields are full. The tongue is midline, and the patient has symmetric elevation of the soft palate. No  obvious hearing deficits are noted.  Motor: The motor testing reveals 5 over 5 strength of all 4 extremities. Good symmetric motor tone is noted throughout.  Sensory: Sensory testing is intact to pinprick, soft touch, vibration sensation, and position sense on all 4 extremities, with exception there is some decreased pinprick sedation on the left leg as compared to right. No evidence of extinction is noted.  Coordination: Cerebellar testing reveals good finger-nose-finger and heel-to-shin bilaterally.  Gait and station: Gait is normal. Tandem gait is normal. Romberg is negative. No drift is seen.  Reflexes: Deep tendon reflexes are symmetric and normal bilaterally. Toes are downgoing bilaterally.   Assessment/Plan:  1. History of pseudotumor cerebri  2. Nonorganic neurologic examination  3. History of post traumatic stress disorder  4. Headache  5. Episodes of dizziness, facial numbness, speech disturbance, pulsatile  tinnitus  The patient is having frequent episodes of facial numbness and dizziness, etiology is unclear. The patient has nonorganic neurologic examination today, splitting midline with vibration sensation on the forehead. The patient does have a history of pseudotumor cerebri. The disc margins are slightly blurred. The patient will be treated with Topamax, if her symptoms do not improve, we will pursue MRI evaluation of the brain, and a repeat the lumbar puncture. An EEG study may be done as well. The patient will follow-up in 3-4 months. She will contact our office if she is not doing well.  Jill Alexanders MD 05/30/2014 8:43 PM  [facility] [address] [place], [place] [address]  Phone [phone] Fax [phone]

## 2014-05-30 NOTE — Patient Instructions (Signed)
   Topamax (topiramate) is a seizure medication that has an FDA approval for seizures and for migraine headache. Potential side effects of this medication include weight loss, cognitive slowing, tingling in the fingers and toes, and carbonated drinks will taste bad. If any significant side effects are noted on this drug, please contact our office.  Pseudotumor Cerebri Pseudotumor cerebri, also called idiopathic intracranial hypertension, is a condition that occurs due to increased pressure within your skull. Although some of the symptoms resemble those of a brain tumor, it is not a brain tumor. Symptoms occur when the increased pressure in your skull compresses brain structures. For example, pressure on the nerve responsible for vision (optic nerve) causes it to swell, resulting in visual symptoms. Pseudotumor cerebri tends to occur in obese women younger than 45 years of age. However, men and children can also develop this condition. SYMPTOMS  Symptoms of pseudotumor cerebri occur due to increased pressure within the skull. Symptoms may include:   Headaches.  Nausea and vomiting.  Dizziness.  High blood pressure.   Ringing in the ears.  Double or blurred vision.  Brief episodes of complete loss of vision.  Pain in the back, neck, or shoulders. DIAGNOSIS  Pseudotumor cerebri is diagnosed through:  A detailed eye exam, which can reveal a swollen optic nerve, as well as identifying issues such as blind spots in the vision.  An MRI or CT scan to rule out other disorders that can cause similar symptoms, such as brain tumors.  A spinal tap (lumbar puncture), which can demonstrate increased pressure within the skull. TREATMENT  There are several ways that pseudotumor cerebri is treated, including:  Medicines to decrease the production of spinal fluid and lower the pressure within your skull.  Medicines to prevent or treat headaches.  Surgery to create an opening in your optic nerve  to allow excess fluid to drain out.  Surgery to place drains (shunts) in your brain to remove excess fluid. HOME CARE INSTRUCTIONS   Take all medicines as directed by your health care provider.  Go to all of your follow-up appointments.  Lose weight if you are overweight. SEEK MEDICAL CARE IF:  Any symptoms come back.  You develop trouble with hearing, vision, balance, or your sense of smell.  You cannot eat or drink what you need.  You are more weak or tired than usual.   You are losing weight without trying. SEEK IMMEDIATE MEDICAL CARE IF:  You have new symptoms such as vision problems or difficulty walking.   You have a seizure.   You have trouble breathing.   You have a fever.  Document Released: 02/06/2013 Document Reviewed: 02/06/2013 [facility] Patient Information 2015 [place], [place]. This information is not intended to replace advice given to you by your health care provider. Make sure you discuss any questions you have with your health care provider.

## 2014-05-31 ENCOUNTER — Telehealth: Payer: Self-pay | Admitting: Neurology

## 2014-05-31 MED ORDER — TOPIRAMATE 25 MG PO TABS: ORAL_TABLET | ORAL | Status: DC

## 2014-05-31 NOTE — Telephone Encounter (Signed)
Rx has been resent.  I called back to advise. She is aware.

## 2014-05-31 NOTE — Telephone Encounter (Signed)
Patient checking status of Rx topiramate (TOPAMAX) 25 MG tablet.  Please call and advise.

## 2014-06-19 ENCOUNTER — Telehealth: Payer: Self-pay | Admitting: Neurology

## 2014-06-19 NOTE — Telephone Encounter (Signed)
Patient called and stated that she has been experiencing memory loss and dizziness and would like to speak with someone regarding these problems. She would like to also talk about possibly moving her 8/28 appt. to an earlier time because these issues are really concerning her and her family. Please call and advise.

## 2014-06-19 NOTE — Telephone Encounter (Signed)
Patient returned call. Please call back

## 2014-06-19 NOTE — Telephone Encounter (Signed)
I scheduled patient an appointment for 5/6 at [facility].

## 2014-06-19 NOTE — Telephone Encounter (Signed)
I returned patient's call. Left voicemail to please call back.

## 2014-06-21 ENCOUNTER — Ambulatory Visit (INDEPENDENT_AMBULATORY_CARE_PROVIDER_SITE_OTHER): Payer: Medicaid Other | Admitting: Neurology

## 2014-06-21 ENCOUNTER — Encounter: Payer: Self-pay | Admitting: Neurology

## 2014-06-21 VITALS — BP 100/65 | HR 67 | Ht 68.0 in | Wt 226.6 lb

## 2014-06-21 DIAGNOSIS — E538 Deficiency of other specified B group vitamins: Secondary | ICD-10-CM | POA: Diagnosis not present

## 2014-06-21 DIAGNOSIS — G43009 Migraine without aura, not intractable, without status migrainosus: Secondary | ICD-10-CM | POA: Diagnosis not present

## 2014-06-21 DIAGNOSIS — R413 Other amnesia: Secondary | ICD-10-CM

## 2014-06-21 DIAGNOSIS — G932 Benign intracranial hypertension: Secondary | ICD-10-CM

## 2014-06-21 HISTORY — DX: Other amnesia: R41.3

## 2014-06-21 NOTE — Patient Instructions (Signed)
Pseudotumor Cerebri Pseudotumor cerebri, also called idiopathic intracranial hypertension, is a condition that occurs due to increased pressure within your skull. Although some of the symptoms resemble those of a brain tumor, it is not a brain tumor. Symptoms occur when the increased pressure in your skull compresses brain structures. For example, pressure on the nerve responsible for vision (optic nerve) causes it to swell, resulting in visual symptoms. Pseudotumor cerebri tends to occur in obese women younger than 45 years of age. However, men and children can also develop this condition. SYMPTOMS  Symptoms of pseudotumor cerebri occur due to increased pressure within the skull. Symptoms may include:   Headaches.  Nausea and vomiting.  Dizziness.  High blood pressure.   Ringing in the ears.  Double or blurred vision.  Brief episodes of complete loss of vision.  Pain in the back, neck, or shoulders. DIAGNOSIS  Pseudotumor cerebri is diagnosed through:  A detailed eye exam, which can reveal a swollen optic nerve, as well as identifying issues such as blind spots in the vision.  An MRI or CT scan to rule out other disorders that can cause similar symptoms, such as brain tumors.  A spinal tap (lumbar puncture), which can demonstrate increased pressure within the skull. TREATMENT  There are several ways that pseudotumor cerebri is treated, including:  Medicines to decrease the production of spinal fluid and lower the pressure within your skull.  Medicines to prevent or treat headaches.  Surgery to create an opening in your optic nerve to allow excess fluid to drain out.  Surgery to place drains (shunts) in your brain to remove excess fluid. HOME CARE INSTRUCTIONS   Take all medicines as directed by your health care provider.  Go to all of your follow-up appointments.  Lose weight if you are overweight. SEEK MEDICAL CARE IF:  Any symptoms come back.  You develop  trouble with hearing, vision, balance, or your sense of smell.  You cannot eat or drink what you need.  You are more weak or tired than usual.   You are losing weight without trying. SEEK IMMEDIATE MEDICAL CARE IF:  You have new symptoms such as vision problems or difficulty walking.   You have a seizure.   You have trouble breathing.   You have a fever.  Document Released: 02/06/2013 Document Reviewed: 02/06/2013 [facility] Patient Information 2015 [place], [place]. This information is not intended to replace advice given to you by your health care provider. Make sure you discuss any questions you have with your health care provider.

## 2014-06-21 NOTE — Progress Notes (Signed)
Reason for visit: Pseudotumor cerebri  Brittney [name] is an 44 y.o. female  History of present illness:  Brittney [name] is a 44 year old right-handed black female with a history of depression, and pseudotumor cerebri that may have been related to the use of lithium previously. The patient had been seen previously for problems with headaches, and facial numbness. The patient gives a history of fibromyalgia. Her last examination had nonorganic features associated with it. The patient returns at this point with concerns of memory and concentration problems. She believes that this issue predated the onset of Topamax therapy. She believes that the Topamax has helped her symptoms. The sensation of pressure in the head, and episodes of speech problems and facial numbness have gotten better, and are much less frequent while on Topamax. The episodes are occurring once every week or 2 at this time. The patient also reports excessive daytime drowsiness. She believes that she sleeps well at night, and she is not aware that she is snoring. However, she remains quite drowsy and sleepy throughout the day. She has difficulty staying awake. She returns to the office today secondary to concerns about her cognitive processing.  Past Medical History  Diagnosis Date  . Anxiety   . GERD (gastroesophageal reflux disease)   . 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 Headache(784.0)     MIGRAINES  . Arthritis     KNEES ELBOWS HIPS SHOULDER FINGERS  . Depression     BIPOLAR  . Bipolar disorder   . Obesity   . OA (osteoarthritis) of knee   . Asthma   . Dyspnea   . Back pain   . Neck pain   . Hyperlipidemia   . Fibromyalgia   . 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  . Memory difficulties 06/21/2014    Past Surgical History  Procedure Laterality Date  . Laparoscopy N/A 11/21/2012    Procedure: LAPAROSCOPY OPERATIVE REMOVAL OF RIGHT TUBE AND OVARY AND FLUID FROM MASS;  Surgeon: Melina Schools, MD;   Location: [facility];  Service: Gynecology;  Laterality: N/A;  1 1/2hrs OR time  . Oophorectomy    . Cardiac catheterization  2010  . Dilation and curettage of uterus  1994    Family History  Problem Relation Age of Onset  . Cancer - Lung Father   . Diabetes Father   . Cirrhosis Mother   . Heart disease Maternal Grandmother   . Alzheimer's disease Maternal Grandfather   . Cancer - Lung Paternal Grandmother   . Migraines Sister   . Stroke Sister   . Healthy Brother   . Healthy Sister   . Healthy Sister   . Healthy Sister   . Healthy Brother   . Healthy Brother   . Healthy Brother   . Healthy Brother   . Healthy Brother     Social history:  reports that she quit smoking about 11 years ago. She has never used smokeless tobacco. She reports that she does not drink alcohol or use illicit drugs.    Allergies  Allergen Reactions  . Nsaids     REACTION: edema   Pt says she can take ibuprofen    Medications:  Prior to Admission medications   Medication Sig Start Date End Date Taking? Authorizing Provider  fluticasone (FLONASE) 50 MCG/ACT nasal spray Place 1 spray into both nostrils daily. 12/16/13  Yes Kerrie Buffalo, NP  meloxicam (MOBIC) 15 MG tablet Take 1 tablet (15 mg total)  by mouth at bedtime. 12/14/13  Yes Kerrie Buffalo, NP  pregabalin (LYRICA) 50 MG capsule Take 50 mg by mouth daily.   Yes Historical Provider, MD  topiramate (TOPAMAX) 25 MG tablet One tablet twice a day for 2 weeks, then take 2 tablets twice a day 05/31/14  Yes Kathrynn Ducking, MD  traMADol (ULTRAM) 50 MG tablet Take 50 mg by mouth every 6 (six) hours as needed. 06/19/14  Yes Historical Provider, MD    ROS:  Out of a complete 14 system review of symptoms, the patient complains only of the following symptoms, and all other reviewed systems are negative.  Appetite change, fatigue Shortness of breath Constipation, diarrhea, nausea Daytime sleepiness Joint pain, back pain, muscle cramps, walking  difficulties, neck pain Memory loss, headache, numbness Agitation, confusion, depression, anxiety  Blood pressure 100/65, pulse 67, height 5\' 8"  (1.727 m), weight 226 lb 9.6 oz (102.785 kg).  Physical Exam  General: The patient is alert and cooperative at the time of the examination. The patient is minimally to moderately obese.  Skin: No significant peripheral edema is noted.   Neurologic Exam  Mental status: The patient is alert and oriented x 3 at the time of the examination. The patient has apparent normal recent and remote memory, with an apparently normal attention span and concentration ability. Mini-Mental Status Examination done today shows a total score of 30/30.   Cranial nerves: Facial symmetry is present. Speech is normal, no aphasia or dysarthria is noted. Extraocular movements are full. Visual fields are full.  Motor: The patient has good strength in all 4 extremities.  Sensory examination: Soft touch sensation is symmetric on the face, arms, and legs.  Coordination: The patient has good finger-nose-finger and heel-to-shin bilaterally.  Gait and station: The patient has a normal gait. Tandem gait is normal. Romberg is negative. No drift is seen.  Reflexes: Deep tendon reflexes are symmetric.   Assessment/Plan:  1. History of pseudotumor cerebri  2. Bipolar disorder  3. Reported memory disorder  4. History of fibromyalgia  5. Nonorganic examination  6. Headache  7. Episodes of facial numbness, dizziness, speech alteration  The patient will be sent for MRI evaluation of the brain at this time. She will have blood work done today, and an EEG study. The patient appears to have significant issues with excessive daytime drowsiness. She has gained benefit from the use of Topamax, and she believes that her cognitive issues predated the use of Topamax. The patient has excessive daytime drowsiness, and a sleep disorder needs to be considered. The patient also  reports a history of fibromyalgia which may have negative cognitive effects. The patient will follow-up for her usual appointment in August 2016.  Jill Alexanders MD 06/21/2014 8:24 PM  [facility] [address] [place], [place] [address]  Phone [phone] Fax [phone]

## 2014-06-24 ENCOUNTER — Ambulatory Visit (INDEPENDENT_AMBULATORY_CARE_PROVIDER_SITE_OTHER): Payer: Medicaid Other | Admitting: Neurology

## 2014-06-24 ENCOUNTER — Telehealth: Payer: Self-pay | Admitting: Neurology

## 2014-06-24 DIAGNOSIS — R413 Other amnesia: Secondary | ICD-10-CM

## 2014-06-24 DIAGNOSIS — G932 Benign intracranial hypertension: Secondary | ICD-10-CM | POA: Diagnosis not present

## 2014-06-24 DIAGNOSIS — G43009 Migraine without aura, not intractable, without status migrainosus: Secondary | ICD-10-CM

## 2014-06-24 DIAGNOSIS — G479 Sleep disorder, unspecified: Secondary | ICD-10-CM

## 2014-06-24 LAB — TSH: TSH: 0.362 u[IU]/mL — AB (ref 0.450–4.500)

## 2014-06-24 LAB — VITAMIN B12: VITAMIN B 12: 1386 pg/mL — AB (ref 211–946)

## 2014-06-24 LAB — RPR: RPR: NONREACTIVE

## 2014-06-24 LAB — COPPER, SERUM: Copper: 119 ug/dL (ref 72–166)

## 2014-06-24 LAB — HIV ANTIBODY (ROUTINE TESTING W REFLEX): HIV SCREEN 4TH GENERATION: NONREACTIVE

## 2014-06-24 LAB — METHYLMALONIC ACID, SERUM: Methylmalonic Acid: 102 nmol/L (ref 0–378)

## 2014-06-24 LAB — SEDIMENTATION RATE: Sed Rate: 3 mm/hr (ref 0–32)

## 2014-06-24 NOTE — Telephone Encounter (Signed)
I called the patient. The EEG study was unremarkable, but the patient appears to go in and out of sleep fairly rapidly throughout the recording. The patient reports excessive daytime drowsiness, I will set her up for a sleep study.

## 2014-06-24 NOTE — Procedures (Signed)
    History:  Brittney [name] is a 44 year old patient with a history of episodes of facial numbness, dizziness, and speech alteration. The patient is being evaluated for these events. She also reports excessive daytime drowsiness.  This is a routine EEG. No skull defects are noted. Medications include Flonase, Motrin, Lyrica, Topamax, and Ultram.   EEG classification: Normal awake and asleep  Description of the recording: The background rhythms of this recording consists of a fairly well modulated medium amplitude background activity of 10 Hz. As the record progresses, the patient initially is in the waking state, but appears to enter the early stage II sleep during the recording, with rudimentary sleep spindles and vertex sharp wave activity seen. The patient appears to go in and out of the sleep state quite frequently during the recording. During the wakeful state, photic stimulation is performed, and this results in a bilateral and symmetric photic driving response. Hyperventilation was also performed, and this results in a minimal buildup of the background rhythm activities without significant slowing seen. At no time during the recording does there appear to be evidence of spike or spike wave discharges or evidence of focal slowing. EKG monitor shows no evidence of cardiac rhythm abnormalities with a heart rate of 54.  Impression: This is a normal EEG recording in the waking and sleeping state. No evidence of ictal or interictal discharges were seen at any time during the recording. The patient enters the sleeping state frequently during the recording with brief episodes of wakefulness.

## 2014-06-24 NOTE — Telephone Encounter (Signed)
Patient would like to speak with someone regarding her TSH test results on [facility]. Please call and advise.

## 2014-06-24 NOTE — Telephone Encounter (Signed)
Patient is calling back about test results.  Please call [phone].  Thanks!

## 2014-06-24 NOTE — Telephone Encounter (Signed)
I called the patient. Left voicemail.

## 2014-06-25 NOTE — Telephone Encounter (Signed)
I spoke to the patient. She is concerned that her TSH is low. I explained to her that per Dr. Jannifer Franklin' note, this is fairly consistent with previous TSH levels that have been drawn. She is also concerned about how high her B12 was. She would like to know if and who she should follow up with on this. I explained to her that Dr. Jannifer Franklin stated the results were unremarkable and was not concerned by these values but she wishes to speak to him.

## 2014-06-25 NOTE — Telephone Encounter (Signed)
I called the patient. Left voicemail stating that according to Dr. Jannifer Franklin' note on her blood work, he stated her thyroid level is similar to previous blood work studies and it is nothing he seems to be concerned with at this time. If she has any further questions, she will call our office.

## 2014-06-25 NOTE — Telephone Encounter (Signed)
Patient called/returning Kelby's call. Please call and advise. Patient can be reached @ [phone]  Patient will be at a doctor's office at 1PM please try to call before or after 2PM. Thanks!

## 2014-06-25 NOTE — Telephone Encounter (Signed)
I called the patient. The TSH is slightly low, the patient has been running relatively low TSH levels for 6 or 7 years. The primary doctor may wish to check actual thyroid hormone levels.. The B12 level was high, I'm not concerned about this. I discussed this with the patient.

## 2014-07-06 ENCOUNTER — Ambulatory Visit
Admission: RE | Admit: 2014-07-06 | Discharge: 2014-07-06 | Disposition: A | Discharge status: Home / Self Care | Payer: Medicaid Other | Source: Ambulatory Visit | Attending: Neurology | Admitting: Neurology

## 2014-07-06 DIAGNOSIS — G43009 Migraine without aura, not intractable, without status migrainosus: Secondary | ICD-10-CM

## 2014-07-06 DIAGNOSIS — G932 Benign intracranial hypertension: Secondary | ICD-10-CM | POA: Diagnosis not present

## 2014-07-06 DIAGNOSIS — R413 Other amnesia: Secondary | ICD-10-CM

## 2014-07-09 ENCOUNTER — Telehealth: Payer: Self-pay | Admitting: Neurology

## 2014-07-09 NOTE — Telephone Encounter (Signed)
  I called patient. MRI the brain is relatively unremarkable. The patient will be set up for a sleep study, hopefully be done in the near future.  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 Although nonspecific this is commonly seen with pseudotumor cerebri. There are no acute findings.

## 2014-07-22 ENCOUNTER — Ambulatory Visit: Payer: Medicaid Other | Admitting: Neurology

## 2014-07-23 ENCOUNTER — Encounter: Payer: Self-pay | Admitting: Neurology

## 2014-09-07 ENCOUNTER — Other Ambulatory Visit: Payer: Self-pay

## 2014-09-07 MED ORDER — TOPIRAMATE 25 MG PO TABS: ORAL_TABLET | ORAL | Status: DC

## 2014-10-08 ENCOUNTER — Ambulatory Visit (INDEPENDENT_AMBULATORY_CARE_PROVIDER_SITE_OTHER): Payer: Medicaid Other | Admitting: Neurology

## 2014-10-08 ENCOUNTER — Encounter: Payer: Self-pay | Admitting: Neurology

## 2014-10-08 VITALS — BP 114/72 | HR 60 | Ht 68.0 in | Wt 205.5 lb

## 2014-10-08 DIAGNOSIS — G932 Benign intracranial hypertension: Secondary | ICD-10-CM

## 2014-10-08 DIAGNOSIS — G43009 Migraine without aura, not intractable, without status migrainosus: Secondary | ICD-10-CM | POA: Diagnosis not present

## 2014-10-08 MED ORDER — DULOXETINE HCL 30 MG PO CPEP: 30.0000 mg | ORAL_CAPSULE | Freq: Every day | ORAL | Status: DC

## 2014-10-08 NOTE — Patient Instructions (Addendum)
  Pseudotumor Cerebri Pseudotumor cerebri, also called idiopathic intracranial hypertension, is a condition that occurs due to increased pressure within your skull. Although some of the symptoms resemble those of a brain tumor, it is not a brain tumor. Symptoms occur when the increased pressure in your skull compresses brain structures. For example, pressure on the nerve responsible for vision (optic nerve) causes it to swell, resulting in visual symptoms. Pseudotumor cerebri tends to occur in obese women younger than 45 years of age. However, men and children can also develop this condition. SYMPTOMS  Symptoms of pseudotumor cerebri occur due to increased pressure within the skull. Symptoms may include:   Headaches.  Nausea and vomiting.  Dizziness.  High blood pressure.   Ringing in the ears.  Double or blurred vision.  Brief episodes of complete loss of vision.  Pain in the back, neck, or shoulders. DIAGNOSIS  Pseudotumor cerebri is diagnosed through:  A detailed eye exam, which can reveal a swollen optic nerve, as well as identifying issues such as blind spots in the vision.  An MRI or CT scan to rule out other disorders that can cause similar symptoms, such as brain tumors.  A spinal tap (lumbar puncture), which can demonstrate increased pressure within the skull. TREATMENT  There are several ways that pseudotumor cerebri is treated, including:  Medicines to decrease the production of spinal fluid and lower the pressure within your skull.  Medicines to prevent or treat headaches.  Surgery to create an opening in your optic nerve to allow excess fluid to drain out.  Surgery to place drains (shunts) in your brain to remove excess fluid. HOME CARE INSTRUCTIONS   Take all medicines as directed by your health care provider.  Go to all of your follow-up appointments.  Lose weight if you are overweight. SEEK MEDICAL CARE IF:  Any symptoms come back.  You develop  trouble with hearing, vision, balance, or your sense of smell.  You cannot eat or drink what you need.  You are more weak or tired than usual.   You are losing weight without trying. SEEK IMMEDIATE MEDICAL CARE IF:  You have new symptoms such as vision problems or difficulty walking.   You have a seizure.   You have trouble breathing.   You have a fever.  Document Released: 02/06/2013 Document Reviewed: 02/06/2013 [facility] Patient Information 2015 [place], [place]. This information is not intended to replace advice given to you by your health care provider. Make sure you discuss any questions you have with your health care provider.

## 2014-10-08 NOTE — Progress Notes (Signed)
Reason for visit: Pseudotumor cerebri  Brittney [name] is an 44 y.o. female  History of present illness:  [name] is a 44 year old right-handed black female with a history of fibromyalgia, and pseudotumor cerebri. She has been seen through a rheumatology physician, Dr. Dossie Der. The patient is having increasing problems with fibromyalgia pain. She is on Flexeril, Cymbalta, and Lyrica in sub-maximal dosing. The patient has nonorganic features to her clinical examination with a left hemisensory deficit. The patient has had MRI evaluation of the brain that was done that was unremarkable. She has undergone an EEG study that was unremarkable, but the patient was noted to have a rapid onset of sleep multiple times during the recording. She has been set up for sleep study, this has not yet been done. She still reports some cognitive issues with memory. This has not worsened over time.  Past Medical History  Diagnosis Date  . Anxiety   . GERD (gastroesophageal reflux disease)   . 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 Headache(784.0)     MIGRAINES  . Arthritis     KNEES ELBOWS HIPS SHOULDER FINGERS  . Depression     BIPOLAR  . Bipolar disorder   . Obesity   . OA (osteoarthritis) of knee   . Asthma   . Dyspnea   . Back pain   . Neck pain   . Hyperlipidemia   . Fibromyalgia   . 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  . Memory difficulties 06/21/2014    Past Surgical History  Procedure Laterality Date  . Laparoscopy N/A 11/21/2012    Procedure: LAPAROSCOPY OPERATIVE REMOVAL OF RIGHT TUBE AND OVARY AND FLUID FROM MASS;  Surgeon: Melina Schools, MD;  Location: [facility];  Service: Gynecology;  Laterality: N/A;  1 1/2hrs OR time  . Oophorectomy    . Cardiac catheterization  2010  . Dilation and curettage of uterus  1994    Family History  Problem Relation Age of Onset  . Cancer - Lung Father   . Diabetes Father   . Cirrhosis Mother   . Heart disease Maternal Grandmother     . Alzheimer's disease Maternal Grandfather   . Cancer - Lung Paternal Grandmother   . Migraines Sister   . Stroke Sister   . Healthy Brother   . Healthy Sister   . Healthy Sister   . Healthy Sister   . Healthy Brother   . Healthy Brother   . Healthy Brother   . Healthy Brother   . Healthy Brother     Social history:  reports that she quit smoking about 11 years ago. She has never used smokeless tobacco. She reports that she does not drink alcohol or use illicit drugs.    Allergies  Allergen Reactions  . Nsaids     REACTION: edema   Pt says she can take ibuprofen    Medications:  Prior to Admission medications   Medication Sig Start Date End Date Taking? Authorizing Provider  cyclobenzaprine (FLEXERIL) 10 MG tablet Take 10 mg by mouth every 6 (six) hours as needed for muscle spasms.   Yes Historical Provider, MD  DULoxetine (CYMBALTA) 60 MG capsule Take 60 mg by mouth daily. 09/14/14  Yes Historical Provider, MD  Etanercept (ENBREL ) Inject into the skin once a week.   Yes Historical Provider, MD  fluticasone (FLONASE) 50 MCG/ACT nasal spray Place 1 spray into both nostrils daily. 12/16/13  Yes Kerrie Buffalo, NP  folic acid (FOLVITE) 1 MG tablet Take 1 mg by mouth daily. 09/09/14 09/09/15 Yes Historical Provider, MD  HYDROcodone-acetaminophen (NORCO/VICODIN) 5-325 MG per tablet Take 1 tablet by mouth every 8 (eight) hours as needed. 07/01/14  Yes Historical Provider, MD  meloxicam (MOBIC) 15 MG tablet Take 1 tablet (15 mg total) by mouth at bedtime. 12/14/13  Yes Kerrie Buffalo, NP  methotrexate (RHEUMATREX) 2.5 MG tablet Take 15 mg by mouth once a week. 09/09/14  Yes Historical Provider, MD  pregabalin (LYRICA) 50 MG capsule Take 50 mg by mouth daily.   Yes Historical Provider, MD  topiramate (TOPAMAX) 25 MG tablet One tablet twice a day for 2 weeks, then take 2 tablets twice a day 09/07/14  Yes Kathrynn Ducking, MD  traMADol (ULTRAM) 50 MG tablet Take 50 mg by mouth every 6 (six)  hours as needed. 06/19/14  Yes Historical Provider, MD    ROS:  Out of a complete 14 system review of symptoms, the patient complains only of the following symptoms, and all other reviewed systems are negative.  Muscular pain Memory deficit Numbness  Blood pressure 114/72, pulse 60, height 5\' 8"  (1.727 m), weight 205 lb 8 oz (93.214 kg).  Physical Exam  General: The patient is alert and cooperative at the time of the examination. The patient is moderately obese.  Skin: No significant peripheral edema is noted.   Neurologic Exam  Mental status: The patient is alert and oriented x 3 at the time of the examination. The patient has apparent normal recent and remote memory, with an apparently normal attention span and concentration ability. Mini-Mental Status Examination done today shows a total score 29/30. The patient is able to name 12 animals in 30 seconds.   Cranial nerves: Facial symmetry is present. Speech is normal, no aphasia or dysarthria is noted. Extraocular movements are full. Visual fields are full.  Motor: The patient has good strength in all 4 extremities.  Sensory examination: Soft touch sensation decreased on the left face, arm, and leg. The patient has decreased vibration sensation on the left arm and leg, and splits the midline with vibration sensation on the forehead, decreased on the left.  Coordination: The patient has good finger-nose-finger and heel-to-shin bilaterally.  Gait and station: The patient has a normal gait. Tandem gait is normal. Romberg is negative. No drift is seen.  Reflexes: Deep tendon reflexes are symmetric.   Assessment/Plan:  1. Fibromyalgia  2. Left hemisensory deficit, nonorganic  3. Pseudotumor cerebri  4. Excessive daytime drowsiness  The patient will reschedule her sleep appointment, a prescription was given for Cymbalta 30 mg capsules to take in the morning, and 60 mg in the evening. The Lyrica can be increased in the future.  She will follow-up in 6 months, sooner if needed.  Jill Alexanders MD 10/08/2014 7:37 PM  [facility] [address] [place], [place] [address]  Phone [phone] Fax [phone]

## 2014-11-04 ENCOUNTER — Ambulatory Visit: Payer: Medicaid Other | Admitting: Neurology

## 2014-11-07 ENCOUNTER — Telehealth: Payer: Self-pay

## 2014-11-07 ENCOUNTER — Ambulatory Visit: Payer: Medicaid Other | Admitting: Neurology

## 2014-11-07 NOTE — Telephone Encounter (Signed)
Spoke to pt and advised her that Dr. Brett Fairy is out of the office today. Pt is agreeable to r/s until 9/28 at 1:30.

## 2014-11-13 ENCOUNTER — Encounter: Payer: Self-pay | Admitting: Neurology

## 2014-11-13 ENCOUNTER — Ambulatory Visit (INDEPENDENT_AMBULATORY_CARE_PROVIDER_SITE_OTHER): Payer: Medicaid Other | Admitting: Neurology

## 2014-11-13 VITALS — BP 100/64 | HR 74 | Resp 20 | Ht 68.0 in | Wt 202.0 lb

## 2014-11-13 DIAGNOSIS — F5105 Insomnia due to other mental disorder: Secondary | ICD-10-CM | POA: Diagnosis not present

## 2014-11-13 DIAGNOSIS — R5383 Other fatigue: Secondary | ICD-10-CM | POA: Diagnosis not present

## 2014-11-13 DIAGNOSIS — G479 Sleep disorder, unspecified: Secondary | ICD-10-CM | POA: Diagnosis not present

## 2014-11-13 DIAGNOSIS — G4701 Insomnia due to medical condition: Secondary | ICD-10-CM

## 2014-11-13 DIAGNOSIS — F489 Nonpsychotic mental disorder, unspecified: Secondary | ICD-10-CM | POA: Diagnosis not present

## 2014-11-13 DIAGNOSIS — G8929 Other chronic pain: Secondary | ICD-10-CM

## 2014-11-13 NOTE — Progress Notes (Signed)
SLEEP MEDICINE CLINIC   Provider:  Larey Seat, M D  Referring Provider: Antony Contras, MD Primary Care Physician:  Gara Kroner, MD  Chief Complaint  Patient presents with  . sleep consult    rm 11, excessive daytime sleepiness, wakes up herself when she stops breathing, alone    HPI:  Brittney [name] is a 44 y.o. female , seen here as a referral   from Dr. Floyde Parkins for evaluation of excessive daytime sleepiness.  Brittney [name] states that she used to snore but does not do so anymore. She already lost 50 pounds and has reduced her body mass index significantly. This also has reduced her risk of obstructive sleep apnea. She has further a  medical history of a rheumatological disorder treated by Dr. Dossie Der. She is treated with Flexeril, Cymbalta and Lyrica. She has reported frequently nonorganic left hemisensory deficits which Dr. Jannifer Franklin has been worked up he also has found an MRI of her brain unremarkable EEG studies that were unremarkable but doing the EEG the patient fell asleep quickly and easily which led to her referral today. She has also reported some cognitive issues with memory which have been stable for the last couple of years.  Chief complaint according to patient : The patient states that she feels tired, and that she falls asleep when unstimulated and not physically active. She has poor sleep due to pain   Sleep habits are as follows: The patient goes to bed between 9 and 10 at night and usually falls asleep within 30 minutes. Her bedroom is described as core, quiet and dark. She does not watch TV in the bedroom. The last 5 years she has shared the bed with her now 38 year old daughter. This just changed about a month ago she is now sleeping alone. She feels that she gets better sleep when alone in bed. The patient also prefers to sleep on her back which helps her pain condition of fibromyalgia. She gets up between 5 and 6 in a.m. she will have one or 2 caffeine aerated  coffees in the morning she works from home and does not have to commute. Her work room at home has access to day light.  In April 2014 she was again diagnosed with pseudotumor cerebri , had previously been diagnosed in 2009 with benign intracranial hypertension. At that time she was considered morbidly obese and woke up with headaches in the morning. This condition has improved. She describes her sleep as light easily fragmented and interrupted. She no longer has frequent nocturnal bathroom breaks since she lost weight.  Past medical history further includes anxiety, non-organic hemiparesis, bipolar depression, remote history of obesity, osteoarthritis, asthma, back pain neck pain fibromyalgia  Sleep medical history and family sleep history: " all my sleep is interrupted by pain of fibromyalgia ". Her father suffers from major depression and PTSD which also affects his sleep. Everybody in the family, her siblings snore. She has 6 half siblings.  Social history: lives with her daughter .   Review of Systems: Out of a complete 14 system review, the patient complains of only the following symptoms, and all other reviewed systems are negative.   Epworth score 13 points  , Fatigue severity score 48  , depression score PHQ    Social History   Social History  . Marital Status: Divorced    Spouse Name: N/A  . Number of Children: 5  . Years of Education: BA   Occupational History  . Not  on file.   Social History Main Topics  . Smoking status: Former Smoker    Quit date: 02/16/2003  . Smokeless tobacco: Never Used  . Alcohol Use: No     Comment: occasionally  . Drug Use: No  . Sexual Activity: Not Currently   Other Topics Concern  . Not on file   Social History Narrative   Patient drinks 2 cups of caffeine daily.   Patient is right handed.    Family History  Problem Relation Age of Onset  . Cancer - Lung Father   . Diabetes Father   . Cirrhosis Mother   . Heart disease  Maternal Grandmother   . Alzheimer's disease Maternal Grandfather   . Cancer - Lung Paternal Grandmother   . Migraines Sister   . Stroke Sister   . Healthy Brother   . Healthy Sister   . Healthy Sister   . Healthy Sister   . Healthy Brother   . Healthy Brother   . Healthy Brother   . Healthy Brother   . Healthy Brother     Past Medical History  Diagnosis Date  . Anxiety   . GERD (gastroesophageal reflux disease)   . 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 Headache(784.0)     MIGRAINES  . Arthritis     KNEES ELBOWS HIPS SHOULDER FINGERS  . Depression     BIPOLAR  . Bipolar disorder   . Obesity   . OA (osteoarthritis) of knee   . Asthma   . Dyspnea   . Back pain   . Neck pain   . Hyperlipidemia   . Fibromyalgia   . 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  . Memory difficulties 06/21/2014    Past Surgical History  Procedure Laterality Date  . Laparoscopy N/A 11/21/2012    Procedure: LAPAROSCOPY OPERATIVE REMOVAL OF RIGHT TUBE AND OVARY AND FLUID FROM MASS;  Surgeon: Melina Schools, MD;  Location: [facility];  Service: Gynecology;  Laterality: N/A;  1 1/2hrs OR time  . Oophorectomy    . Cardiac catheterization  2010  . Dilation and curettage of uterus  1994    Current Outpatient Prescriptions  Medication Sig Dispense Refill  . cyclobenzaprine (FLEXERIL) 10 MG tablet Take 10 mg by mouth every 6 (six) hours as needed for muscle spasms.    . DULoxetine (CYMBALTA) 30 MG capsule Take 1 capsule (30 mg total) by mouth daily. 30 capsule 3  . DULoxetine (CYMBALTA) 60 MG capsule Take 60 mg by mouth daily.  5  . Etanercept (ENBREL [place]) Inject into the skin once a week.    . fluticasone (FLONASE) 50 MCG/ACT nasal spray Place 1 spray into both nostrils daily.  2  . folic acid (FOLVITE) 1 MG tablet Take 1 mg by mouth daily.    Marland Kitchen HYDROcodone-acetaminophen (NORCO/VICODIN) 5-325 MG per tablet Take 1 tablet by mouth every 8 (eight) hours as needed.    . meloxicam (MOBIC) 15 MG  tablet Take 1 tablet (15 mg total) by mouth at bedtime. 30 tablet 0  . methotrexate (RHEUMATREX) 2.5 MG tablet Take 15 mg by mouth once a week.    . pregabalin (LYRICA) 50 MG capsule Take 50 mg by mouth daily.    Marland Kitchen topiramate (TOPAMAX) 25 MG tablet One tablet twice a day for 2 weeks, then take 2 tablets twice a day (Patient taking differently: Take 50 mg by mouth at bedtime. ) 120 tablet 3  . traMADol (ULTRAM) 50  MG tablet Take 50 mg by mouth every 6 (six) hours as needed.  0   No current facility-administered medications for this visit.    Allergies as of 11/13/2014 - Review Complete 11/13/2014  Allergen Reaction Noted  . Nsaids      Vitals: BP 100/64 mmHg  Pulse 74  Resp 20  Ht 5\' 8"  (1.727 m)  Wt 202 lb (91.627 kg)  BMI 30.72 kg/m2 Last Weight:  Wt Readings from Last 1 Encounters:  11/13/14 202 lb (91.627 kg)   QAS:TMHD mass index is 30.72 kg/(m^2).     Last Height:   Ht Readings from Last 1 Encounters:  11/13/14 5\' 8"  (1.727 m)    Physical exam:  General: The patient is awake, alert and appears not in acute distress. The patient is well groomed. Head: Normocephalic, atraumatic. Neck is supple. Mallampati 2   neck circumference:15. Nasal airflow unrestricted , TMJ is evident . Retrognathia isseen.  Cardiovascular:  Regular rate and rhythm, without  murmurs or carotid bruit, and without distended neck veins. Respiratory: Lungs are clear to auscultation. Skin:  Without evidence of edema, or rash Trunk: BMI is elevated . The patient's posture is erect   Neurologic exam : The patient is awake and alert, oriented to place and time.   Memory subjective  described as impaired    MMSE - Mini Mental State Exam 10/08/2014 06/21/2014  Orientation to time 5 5  Orientation to Place 5 5  Registration 3 3  Attention/ Calculation 5 5  Recall 2 3  Language- name 2 objects 2 2  Language- repeat 1 1  Language- follow 3 step command 3 3  Language- read & follow direction 1 1  Write a  sentence 1 1  Copy design 1 1  Total score 29 30       Attention span & concentration ability appears normal.  Speech is fluent,  without dysarthria, dysphonia or aphasia.  Mood and affect are appropriate.  Cranial nerves: Pupils are equal and briskly reactive to light. Funduscopic exam without evidence of pallor or edema.  Extraocular movements  in vertical and horizontal planes intact and without nystagmus. Visual fields by finger perimetry are intact. Hearing to finger rub intact.   Facial sensation intact to fine touch.  Facial motor strength is symmetric and tongue and uvula move midline. Shoulder shrug was symmetrical.   Motor exam:   Normal tone, muscle bulk and symmetric strength in all extremities. The patient is exquisitely tender to touch and palpation.   Sensory:  Fine touch, pinprick and vibration were tested in all extremities.  Proprioception tested in the upper extremities was normal.  Coordination: Rapid alternating movements in the fingers/hands was normal.  Finger-to-nose maneuver  normal without evidence of ataxia, dysmetria or tremor.  Gait and station: Patient walks without assistive device and is able unassisted to climb up to the exam table. Strength within normal limits.  Stance is stable and normal.  Toe and hell stand were tested .Tandem gait is unfragmented. Turns with  3  Steps. Romberg testing is negative.  Deep tendon reflexes: in the  upper and lower extremities are symmetric and intact. Babinski maneuver response is downgoing.  The patient was advised of the nature of the diagnosed sleep disorder , the treatment options and risks for general a health and wellness arising from not treating the condition.  I spent more than 40 minutes of face to face time with the patient. Greater than 50% of time was spent in  counseling and coordination of care. We have discussed the diagnosis and differential and I answered the patient's questions.     Assessment:   After physical and neurologic examination, review of laboratory studies,  Personal review of imaging studies, reports of other /same  Imaging studies ,  Results of polysomnography/ neurophysiology testing and pre-existing records as far as provided in visit., my assessment is   1) Brittney [name] neurologic evaluation today is almost nonfocal. She is doing very well after her weight loss and she states that she is no longer aware that she would be snoring. Her children seem not to have reported this either. However she has retrognathia and she is forced to sleep on her back due to back pain this can lead to upper airway resistance . Sleep apnea may be present as well. On occasion she has woken up this morning headaches . For this reason I will check her in a split-night polysomnography, and attended sleep study, with capnography.  2) due to her underlying rheumatological condition, the fibromyalgia of the benign intracranial hypertension and arthritis she is also at a higher risk of being fatigued which may or may not manifest in daytime sleepiness.  3) the patient further has a diagnosis of a more disorder which can especially in depressive phases lead to hypersomnia by manic phases usually cause insomnia.   4) She has identified the factor that fragments her sleep as pain impairment attributed to her rheumatological condition    Plan:  Treatment plan and additional workup : I suspect that Brittney [name]'s apnea potential has decreased since her weight loss. She lost a respectable 50 pounds. 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 Should her apnea be found to be associated with REM sleep were causing significant hypoxemia spells this would not be the best treatment for her and we should consider a CPAP machine.   SPLIT night polysomnography with Co2. Rv after  PSG.   Brittney Partridge Dohmeier MD  11/13/2014   CC: Antony Contras, [address] [place], [place] [id number]

## 2014-11-13 NOTE — Patient Instructions (Signed)
Sleep Apnea  Sleep apnea is disorder that affects a person's sleep. A person with sleep apnea has abnormal pauses in their breathing when they sleep. It is hard for them to get a good sleep. This makes a person tired during the day. It also can lead to other physical problems. There are three types of sleep apnea. One type is when breathing stops for a short time because your airway is blocked (obstructive sleep apnea). Another type is when the brain sometimes fails to give the normal signal to breathe to the muscles that control your breathing (central sleep apnea). The third type is a combination of the other two types.  HOME CARE  · Do not sleep on your back. Try to sleep on your side.  · Take all medicine as told by your doctor.  · Avoid alcohol, calming medicines (sedatives), and depressant drugs.  · Try to lose weight if you are overweight. Talk to your doctor about a healthy weight goal.  Your doctor may have you use a device that helps to open your airway. It can help you get the air that you need. It is called a positive airway pressure (PAP) device. There are three types of PAP devices:  · Continuous positive airway pressure (CPAP) device.  · Nasal expiratory positive airway pressure (EPAP) device.  · Bilevel positive airway pressure (BPAP) device.  MAKE SURE YOU:  · Understand these instructions.  · Will watch your condition.  · Will get help right away if you are not doing well or get worse.  Document Released: 11/11/2007 Document Revised: 01/19/2012 Document Reviewed: 06/05/2011  ExitCare® Patient Information ©2015 [employer]. This information is not intended to replace advice given to you by your health care provider. Make sure you discuss any questions you have with your health care provider.

## 2014-11-28 DIAGNOSIS — Z0271 Encounter for disability determination: Secondary | ICD-10-CM

## 2014-12-18 ENCOUNTER — Encounter (HOSPITAL_COMMUNITY): Payer: Self-pay

## 2014-12-18 ENCOUNTER — Emergency Department (HOSPITAL_COMMUNITY): Payer: Medicaid Other

## 2014-12-18 ENCOUNTER — Emergency Department (HOSPITAL_COMMUNITY)
Admission: EM | Admit: 2014-12-18 | Discharge: 2014-12-18 | Disposition: A | Discharge status: Home / Self Care | Payer: Medicaid Other | Attending: Emergency Medicine | Admitting: Emergency Medicine

## 2014-12-18 ENCOUNTER — Telehealth: Payer: Self-pay | Admitting: Neurology

## 2014-12-18 DIAGNOSIS — Z791 Long term (current) use of non-steroidal anti-inflammatories (NSAID): Secondary | ICD-10-CM | POA: Diagnosis not present

## 2014-12-18 DIAGNOSIS — R51 Headache: Secondary | ICD-10-CM | POA: Insufficient documentation

## 2014-12-18 DIAGNOSIS — R519 Headache, unspecified: Secondary | ICD-10-CM

## 2014-12-18 DIAGNOSIS — F319 Bipolar disorder, unspecified: Secondary | ICD-10-CM | POA: Insufficient documentation

## 2014-12-18 DIAGNOSIS — Z7951 Long term (current) use of inhaled steroids: Secondary | ICD-10-CM | POA: Insufficient documentation

## 2014-12-18 DIAGNOSIS — Z79899 Other long term (current) drug therapy: Secondary | ICD-10-CM | POA: Diagnosis not present

## 2014-12-18 DIAGNOSIS — J45909 Unspecified asthma, uncomplicated: Secondary | ICD-10-CM | POA: Diagnosis not present

## 2014-12-18 DIAGNOSIS — G40909 Epilepsy, unspecified, not intractable, without status epilepticus: Secondary | ICD-10-CM | POA: Diagnosis not present

## 2014-12-18 DIAGNOSIS — Z9889 Other specified postprocedural states: Secondary | ICD-10-CM | POA: Insufficient documentation

## 2014-12-18 DIAGNOSIS — M1990 Unspecified osteoarthritis, unspecified site: Secondary | ICD-10-CM | POA: Diagnosis not present

## 2014-12-18 DIAGNOSIS — M797 Fibromyalgia: Secondary | ICD-10-CM | POA: Diagnosis not present

## 2014-12-18 DIAGNOSIS — Z87891 Personal history of nicotine dependence: Secondary | ICD-10-CM | POA: Insufficient documentation

## 2014-12-18 DIAGNOSIS — F419 Anxiety disorder, unspecified: Secondary | ICD-10-CM | POA: Diagnosis not present

## 2014-12-18 DIAGNOSIS — R251 Tremor, unspecified: Secondary | ICD-10-CM | POA: Diagnosis present

## 2014-12-18 DIAGNOSIS — E669 Obesity, unspecified: Secondary | ICD-10-CM | POA: Diagnosis not present

## 2014-12-18 DIAGNOSIS — Z8719 Personal history of other diseases of the digestive system: Secondary | ICD-10-CM | POA: Insufficient documentation

## 2014-12-18 LAB — I-STAT CHEM 8, ED
BUN: 11 mg/dL (ref 6–20)
Calcium, Ion: 1.27 mmol/L — ABNORMAL HIGH (ref 1.12–1.23)
Chloride: 107 mmol/L (ref 101–111)
Creatinine, Ser: 0.8 mg/dL (ref 0.44–1.00)
GLUCOSE: 92 mg/dL (ref 65–99)
HCT: 38 % (ref 36.0–46.0)
HEMOGLOBIN: 12.9 g/dL (ref 12.0–15.0)
POTASSIUM: 4.1 mmol/L (ref 3.5–5.1)
SODIUM: 143 mmol/L (ref 135–145)
TCO2: 22 mmol/L (ref 0–100)

## 2014-12-18 MED ORDER — METOCLOPRAMIDE HCL 5 MG/ML IJ SOLN
10.0000 mg | Freq: Once | INTRAMUSCULAR | Status: AC
  Administered 2014-12-18: 10 mg via INTRAMUSCULAR
  Filled 2014-12-18: qty 2

## 2014-12-18 NOTE — Telephone Encounter (Signed)
Patient is calling to advise she had a seizure this morning and the EMT's are there with her and are taking her to the hospital.

## 2014-12-18 NOTE — ED Notes (Signed)
Pt states shaking lasted 30-45 seconds.  Was in sitting position. Had been in a kneeling position.

## 2014-12-18 NOTE — ED Notes (Signed)
Bed: AS60 Expected date:  Expected time:  Means of arrival:  Comments: Ems- female- maybe had a seizure

## 2014-12-18 NOTE — Telephone Encounter (Signed)
Emergency room note indicates that the patient is having tremors on the hands, loss of consciousness. Likely is not a seizure, the patient has had nonorganic features to her clinical examination previously. I did not call the patient.

## 2014-12-18 NOTE — ED Notes (Signed)
Per EMS, pt from home.  Pt went into closet to pray.  Started having tremors in hands.  "seizure-like activity" description per patient.  Total awareness with symptoms the entire time.  Pt has no hx of seizures.  ? Length of symptoms.  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  Pt had no complaints on EMS arrival.  Denies pain.  Does state increased stress.  Recent death of friend, recent move to new home.  Vitals: 126/78, hr 72, resp 20, 99%.  cbg 105

## 2014-12-19 NOTE — ED Provider Notes (Signed)
CSN: [id number]     Arrival date & time 12/18/14  0957 History   First MD Initiated Contact with Patient 12/18/14 1015     Chief Complaint  Patient presents with  . Tremors      HPI Patient presents to the emergency department after an abnormal experience of her bilateral arms and legs shaking.  She describes this as more of an aggressive tremor.  She denies loss of consciousness.  She states she had been in a sitting position with her legs crossed and then had propped back onto her right arm.  No bowel or bladder complaints.  She thinks the symptoms that have lasted 30-45 seconds.  EMS was called.  On EMS arrival the patient was without any complaints and was asymptomatic.  She feels fine at this time except for a mild headache.  She does have a history of migraine headaches.  No prior history of seizures.  No tongue biting.  No preceding chest pain or palpitations.  No new medications.   Past Medical History  Diagnosis Date  . Anxiety   . GERD (gastroesophageal reflux disease)   . 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 Headache(784.0)     MIGRAINES  . Arthritis     KNEES ELBOWS HIPS SHOULDER FINGERS  . Depression     BIPOLAR  . Bipolar disorder ([place])   . Obesity   . OA (osteoarthritis) of knee   . Asthma   . Dyspnea   . Back pain   . Neck pain   . Hyperlipidemia   . Fibromyalgia   . 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  . Memory difficulties 06/21/2014   Past Surgical History  Procedure Laterality Date  . Laparoscopy N/A 11/21/2012    Procedure: LAPAROSCOPY OPERATIVE REMOVAL OF RIGHT TUBE AND OVARY AND FLUID FROM MASS;  Surgeon: Melina Schools, MD;  Location: [facility];  Service: Gynecology;  Laterality: N/A;  1 1/2hrs OR time  . Oophorectomy    . Cardiac catheterization  2010  . Dilation and curettage of uterus  1994   Family History  Problem Relation Age of Onset  . Cancer - Lung Father   . Diabetes Father   . Cirrhosis Mother   . Heart disease  Maternal Grandmother   . Alzheimer's disease Maternal Grandfather   . Cancer - Lung Paternal Grandmother   . Migraines Sister   . Stroke Sister   . Healthy Brother   . Healthy Sister   . Healthy Sister   . Healthy Sister   . Healthy Brother   . Healthy Brother   . Healthy Brother   . Healthy Brother   . Healthy Brother    Social History  Substance Use Topics  . Smoking status: Former Smoker    Quit date: 02/16/2003  . Smokeless tobacco: Never Used  . Alcohol Use: No     Comment: occasionally   OB History    No data available     Review of Systems  All other systems reviewed and are negative.     Allergies  Nsaids  Home Medications   Prior to Admission medications   Medication Sig Start Date End Date Taking? Authorizing Provider  cyclobenzaprine (FLEXERIL) 10 MG tablet Take 10 mg by mouth every 6 (six) hours as needed for muscle spasms.   Yes Historical Provider, MD  DULoxetine (CYMBALTA) 30 MG capsule Take 1 capsule (30 mg total) by mouth daily. 10/08/14  Yes Elon Alas  Jannifer Franklin, MD  DULoxetine (CYMBALTA) 60 MG capsule Take 60 mg by mouth at bedtime.  09/14/14  Yes Historical Provider, MD  Etanercept (ENBREL [place]) Inject into the skin once a week.   Yes Historical Provider, MD  fluticasone (FLONASE) 50 MCG/ACT nasal spray Place 1 spray into both nostrils daily. 12/16/13  Yes Kerrie Buffalo, NP  folic acid (FOLVITE) 1 MG tablet Take 1 mg by mouth daily. 09/09/14 09/09/15 Yes Historical Provider, MD  meloxicam (MOBIC) 15 MG tablet Take 1 tablet (15 mg total) by mouth at bedtime. 12/14/13  Yes Kerrie Buffalo, NP  methotrexate (RHEUMATREX) 2.5 MG tablet Take 12.5 mg by mouth once a week. Takes 5 tablets twice daily only on Saturday. 09/09/14  Yes Historical Provider, MD  pregabalin (LYRICA) 50 MG capsule Take 50 mg by mouth 2 (two) times daily.    Yes Historical Provider, MD  topiramate (TOPAMAX) 25 MG tablet One tablet twice a day for 2 weeks, then take 2 tablets twice a  day Patient taking differently: Take 50 mg by mouth 2 (two) times daily.  09/07/14  Yes Kathrynn Ducking, MD  traMADol (ULTRAM) 50 MG tablet Take 50 mg by mouth every 6 (six) hours as needed for moderate pain.  06/19/14  Yes Historical Provider, MD   BP 99/67 mmHg  Pulse 66  Temp(Src) 98.3 F (36.8 C) (Oral)  Resp 14  SpO2 100% Physical Exam  Constitutional: She is oriented to person, place, and time. She appears well-developed and well-nourished. No distress.  HENT:  Head: Normocephalic and atraumatic.  Eyes: EOM are normal. Pupils are equal, round, and reactive to light.  Neck: Normal range of motion.  Cardiovascular: Normal rate, regular rhythm and normal heart sounds.   Pulmonary/Chest: Effort normal and breath sounds normal.  Abdominal: Soft. She exhibits no distension. There is no tenderness.  Musculoskeletal: Normal range of motion.  Neurological: She is alert and oriented to person, place, and time.  5/5 strength in major muscle groups of  bilateral upper and lower extremities. Speech normal. No facial asymetry.   Skin: Skin is warm and dry.  Psychiatric: She has a normal mood and affect. Judgment normal.  Nursing note and vitals reviewed.   ED Course  Procedures (including critical care time) Labs Review Labs Reviewed  I-STAT CHEM 8, ED - Abnormal; Notable for the following:    Calcium, Ion 1.27 (*)    All other components within normal limits    Imaging Review Ct Head Wo Contrast  12/18/2014  CLINICAL DATA:  Headache with tremor; questionable seizure EXAM: CT HEAD WITHOUT CONTRAST TECHNIQUE: Contiguous axial images were obtained from the base of the skull through the vertex without intravenous contrast. COMPARISON:  Head CT May 23, 2008 and brain MRI Jul 06, 2014 FINDINGS: The ventricles are normal in size and configuration. There is invagination of CSF into the sella consistent with a degree of empty sella. There is no intracranial mass, hemorrhage, extra-axial fluid  collection, or midline shift. The gray-white compartments are normal. There is no acute infarct evident. Bony calvarium appears intact. Mastoid air cells are clear. There is opacification in a portion of the right sphenoid sinus. IMPRESSION: Right sphenoid sinus disease. 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 No intracranial mass, hemorrhage, or extra-axial fluid collection. No acute infarct evident. Electronically Signed   By: Lowella Grip III M.D.   On: 12/18/2014 11:21   I have personally reviewed and evaluated these images and lab results as part of my medical decision-making.  EKG Interpretation   Date/Time:  Wednesday December 18 2014 09:59:59 EDT Ventricular Rate:  71 PR Interval:  166 QRS Duration: 92 QT Interval:  416 QTC Calculation: 452 R Axis:   81 Text Interpretation:  Sinus rhythm Low voltage, precordial leads No  significant change was found Confirmed by Natacha Jepsen  MD, Jaqwan Wieber ([id number]) on  12/18/2014 10:40:04 AM      MDM   Final diagnoses:  Headache, unspecified headache type    Overall the patient is well-appearing.  She has remained asymptomatic in the emergency department and her mild headache to resolve with standard medications.  Head CT is negative.  Patient was referred to her neurologist for follow-up.  She understands to return to the ER for new or worsening symptoms.  Does not sound like a focal seizure.  No loss of consciousness.  Patient understands to return to the ER for new or worsening symptoms    Jola Schmidt, MD 12/19/14 [phone]

## 2014-12-30 ENCOUNTER — Encounter: Payer: Self-pay | Admitting: Physical Medicine & Rehabilitation

## 2015-01-27 ENCOUNTER — Encounter: Payer: Self-pay | Admitting: Neurology

## 2015-01-27 ENCOUNTER — Ambulatory Visit (INDEPENDENT_AMBULATORY_CARE_PROVIDER_SITE_OTHER): Payer: Self-pay | Admitting: Neurology

## 2015-01-27 VITALS — BP 115/75 | HR 66 | Ht 68.0 in | Wt 202.0 lb

## 2015-01-27 DIAGNOSIS — G932 Benign intracranial hypertension: Secondary | ICD-10-CM

## 2015-01-28 ENCOUNTER — Encounter: Payer: Self-pay | Admitting: Neurology

## 2015-01-28 NOTE — Progress Notes (Signed)
The patient left without being seen.

## 2015-02-03 ENCOUNTER — Telehealth: Payer: Self-pay | Admitting: Neurology

## 2015-02-03 ENCOUNTER — Encounter: Payer: Medicaid Other | Attending: Physical Medicine & Rehabilitation | Admitting: Physical Medicine & Rehabilitation

## 2015-02-03 ENCOUNTER — Encounter: Payer: Self-pay | Admitting: Physical Medicine & Rehabilitation

## 2015-02-03 ENCOUNTER — Other Ambulatory Visit: Payer: Self-pay | Admitting: Physical Medicine & Rehabilitation

## 2015-02-03 VITALS — BP 132/77 | HR 81 | Resp 14

## 2015-02-03 DIAGNOSIS — G43909 Migraine, unspecified, not intractable, without status migrainosus: Secondary | ICD-10-CM | POA: Diagnosis not present

## 2015-02-03 DIAGNOSIS — G8929 Other chronic pain: Secondary | ICD-10-CM | POA: Insufficient documentation

## 2015-02-03 DIAGNOSIS — Z5181 Encounter for therapeutic drug level monitoring: Secondary | ICD-10-CM

## 2015-02-03 DIAGNOSIS — R413 Other amnesia: Secondary | ICD-10-CM | POA: Insufficient documentation

## 2015-02-03 DIAGNOSIS — F329 Major depressive disorder, single episode, unspecified: Secondary | ICD-10-CM | POA: Insufficient documentation

## 2015-02-03 DIAGNOSIS — M797 Fibromyalgia: Secondary | ICD-10-CM

## 2015-02-03 DIAGNOSIS — M069 Rheumatoid arthritis, unspecified: Secondary | ICD-10-CM | POA: Insufficient documentation

## 2015-02-03 DIAGNOSIS — J45909 Unspecified asthma, uncomplicated: Secondary | ICD-10-CM | POA: Insufficient documentation

## 2015-02-03 DIAGNOSIS — F4310 Post-traumatic stress disorder, unspecified: Secondary | ICD-10-CM

## 2015-02-03 DIAGNOSIS — E785 Hyperlipidemia, unspecified: Secondary | ICD-10-CM | POA: Insufficient documentation

## 2015-02-03 DIAGNOSIS — Z87891 Personal history of nicotine dependence: Secondary | ICD-10-CM | POA: Insufficient documentation

## 2015-02-03 DIAGNOSIS — R569 Unspecified convulsions: Secondary | ICD-10-CM | POA: Diagnosis not present

## 2015-02-03 DIAGNOSIS — F319 Bipolar disorder, unspecified: Secondary | ICD-10-CM | POA: Diagnosis not present

## 2015-02-03 DIAGNOSIS — M7581 Other shoulder lesions, right shoulder: Secondary | ICD-10-CM

## 2015-02-03 DIAGNOSIS — F419 Anxiety disorder, unspecified: Secondary | ICD-10-CM | POA: Insufficient documentation

## 2015-02-03 DIAGNOSIS — M779 Enthesopathy, unspecified: Secondary | ICD-10-CM | POA: Insufficient documentation

## 2015-02-03 DIAGNOSIS — Z79899 Other long term (current) drug therapy: Secondary | ICD-10-CM

## 2015-02-03 DIAGNOSIS — G932 Benign intracranial hypertension: Secondary | ICD-10-CM

## 2015-02-03 DIAGNOSIS — G894 Chronic pain syndrome: Secondary | ICD-10-CM

## 2015-02-03 DIAGNOSIS — K219 Gastro-esophageal reflux disease without esophagitis: Secondary | ICD-10-CM | POA: Diagnosis not present

## 2015-02-03 DIAGNOSIS — E669 Obesity, unspecified: Secondary | ICD-10-CM | POA: Insufficient documentation

## 2015-02-03 DIAGNOSIS — M7582 Other shoulder lesions, left shoulder: Secondary | ICD-10-CM

## 2015-02-03 MED ORDER — PREGABALIN 100 MG PO CAPS: 100.0000 mg | ORAL_CAPSULE | Freq: Two times a day (BID) | ORAL | Status: DC

## 2015-02-03 NOTE — Telephone Encounter (Signed)
Rn call patient about seeing Dr. Jannifer Franklin sooner than 04/09/2014 at 10:00am. Pt stated she check in on 01-27-15 for appt but have to leave. Pt also stated she is currently on Topamax, and states the follow up was for evaluation of the Topamax. Patient also stated that its not right that she cannot be seen for a work in. Pt stated she does not want to be on Topamax if she does not have too. Pt stated she feels like [facility] should work patients in if they check in with the front desk. Rn talk to Dr. Jannifer Franklin and she stated pt can be seen by [name](NP) for evaluation of Topamax. Pt stated the appt in February was for her fibromyalgia.Marland Kitchen
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
## 2015-02-03 NOTE — Patient Instructions (Signed)
RECOMMENDED SUPPLEMENTS:  COENZYME Q 10, DHEA, OMEGA THREE FATTY ACIDS, MAGNESIUM, GARLIC----ALL CAN HELP WITH PAIN

## 2015-02-03 NOTE — Telephone Encounter (Signed)
Pt called to r/s appt that she had to leave on 12/12- she said she thought her appt was at 9 but when she checked she was told it was at 10. She left at 10:35 because she had to go to another appt. Dr Jannifer Franklin is booked until April. Can she be worked in?

## 2015-02-03 NOTE — Telephone Encounter (Signed)
Rn call patient about seeing [name](NP) per Dr.Willis for sooner appt. Pt will be seeing [name] on 02-19-15 at 0100pm for Topamax evaluation. Pt told to arrive at 1245pm for check in. Pt stated she will check in at 1245pm for check in for 0100pm, appt.Brittney [name]

## 2015-02-04 ENCOUNTER — Telehealth: Payer: Self-pay

## 2015-02-04 LAB — PRESCRIPTION MONITORING PROFILE (SOLSTAS)
AMPHETAMINE/METH: NEGATIVE ng/mL
BENZODIAZEPINE SCREEN, URINE: NEGATIVE ng/mL
BUPRENORPHINE, URINE: NEGATIVE ng/mL
Barbiturate Screen, Urine: NEGATIVE ng/mL
CANNABINOID SCRN UR: NEGATIVE ng/mL
CREATININE, URINE: 30.08 mg/dL (ref >20.0)
Carisoprodol, Urine: NEGATIVE ng/mL
Cocaine Metabolites: NEGATIVE ng/mL
ECSTASY: NEGATIVE ng/mL
Fentanyl, Ur: NEGATIVE ng/mL
MEPERIDINE UR: NEGATIVE ng/mL
METHADONE SCREEN, URINE: NEGATIVE ng/mL
Nitrites, Initial: NEGATIVE ug/mL
OPIATE SCREEN, URINE: NEGATIVE ng/mL
OXYCODONE SCRN UR: NEGATIVE ng/mL
PH URINE, INITIAL: 5 pH (ref 4.5–8.9)
Propoxyphene: NEGATIVE ng/mL
Tapentadol, urine: NEGATIVE ng/mL
Tramadol Scrn, Ur: NEGATIVE ng/mL
ZOLPIDEM, URINE: NEGATIVE ng/mL

## 2015-02-04 LAB — PMP ALCOHOL METABOLITE (ETG): Ethyl Glucuronide (EtG): NEGATIVE ng/mL

## 2015-02-04 MED ORDER — AMITRIPTYLINE HCL 10 MG PO TABS: 10.0000 mg | ORAL_TABLET | Freq: Every day | ORAL | Status: DC

## 2015-02-04 MED ORDER — PREGABALIN 200 MG PO CAPS: 200.0000 mg | ORAL_CAPSULE | Freq: Two times a day (BID) | ORAL | Status: DC

## 2015-02-04 NOTE — Telephone Encounter (Signed)
Her med list said 50mg . I repeated that dose to her and she concurred. I increased to 100mg  bid which I explained to her as well---she concurred again.  Now all of the sudden she was taking 150mg ????

## 2015-02-04 NOTE — Telephone Encounter (Signed)
Increase to 200mg  bid, #60, 3RF.  Also recommend elavil 10mg  qhs, #30, 3RF

## 2015-02-04 NOTE — Telephone Encounter (Signed)
Called to pharmacy and Lattie Haw notified.

## 2015-02-04 NOTE — Telephone Encounter (Signed)
Pt called to clarify dose on Lyrica. Pt states that she is currently taking Lyrica 150mg  BID. The rx written yesterday was for Lyrica 100mg  BID. She stated that she expressed to ZS that Lyrica was not helping with her pain and ZS was going to increase dose. According to new rx, dose has been decreased. Please advise on this? Thanks!

## 2015-02-04 NOTE — Telephone Encounter (Signed)
I spoke with Ms Brittney [name] and her pharmacy and she had been on Lyrica 150 mg bid previously prescribed by Dr Maury Dus.  She says it was her mistake to agree when you said she had been on 50 mg bid.  So if the 150mg  bid is not working well enough what dose?

## 2015-02-19 ENCOUNTER — Ambulatory Visit: Payer: Self-pay | Admitting: Adult Health

## 2015-02-19 NOTE — Progress Notes (Signed)
Urine drug screen for this encounter is consistent for no prescribed medication. 

## 2015-03-17 ENCOUNTER — Encounter: Payer: Medicaid Other | Attending: Physical Medicine & Rehabilitation | Admitting: Physical Medicine & Rehabilitation

## 2015-03-17 ENCOUNTER — Encounter: Payer: Self-pay | Admitting: Physical Medicine & Rehabilitation

## 2015-03-17 VITALS — BP 111/84 | HR 70

## 2015-03-17 DIAGNOSIS — M797 Fibromyalgia: Secondary | ICD-10-CM | POA: Diagnosis not present

## 2015-03-17 DIAGNOSIS — E669 Obesity, unspecified: Secondary | ICD-10-CM | POA: Insufficient documentation

## 2015-03-17 DIAGNOSIS — M7582 Other shoulder lesions, left shoulder: Secondary | ICD-10-CM | POA: Diagnosis not present

## 2015-03-17 DIAGNOSIS — M069 Rheumatoid arthritis, unspecified: Secondary | ICD-10-CM | POA: Diagnosis not present

## 2015-03-17 DIAGNOSIS — R413 Other amnesia: Secondary | ICD-10-CM | POA: Insufficient documentation

## 2015-03-17 DIAGNOSIS — G932 Benign intracranial hypertension: Secondary | ICD-10-CM | POA: Insufficient documentation

## 2015-03-17 DIAGNOSIS — R569 Unspecified convulsions: Secondary | ICD-10-CM | POA: Diagnosis not present

## 2015-03-17 DIAGNOSIS — G43909 Migraine, unspecified, not intractable, without status migrainosus: Secondary | ICD-10-CM | POA: Insufficient documentation

## 2015-03-17 DIAGNOSIS — G8929 Other chronic pain: Secondary | ICD-10-CM | POA: Diagnosis not present

## 2015-03-17 DIAGNOSIS — E785 Hyperlipidemia, unspecified: Secondary | ICD-10-CM | POA: Insufficient documentation

## 2015-03-17 DIAGNOSIS — Z87891 Personal history of nicotine dependence: Secondary | ICD-10-CM | POA: Insufficient documentation

## 2015-03-17 DIAGNOSIS — F4310 Post-traumatic stress disorder, unspecified: Secondary | ICD-10-CM | POA: Diagnosis not present

## 2015-03-17 DIAGNOSIS — M779 Enthesopathy, unspecified: Secondary | ICD-10-CM | POA: Diagnosis not present

## 2015-03-17 DIAGNOSIS — J45909 Unspecified asthma, uncomplicated: Secondary | ICD-10-CM | POA: Diagnosis not present

## 2015-03-17 DIAGNOSIS — F319 Bipolar disorder, unspecified: Secondary | ICD-10-CM | POA: Insufficient documentation

## 2015-03-17 DIAGNOSIS — M7581 Other shoulder lesions, right shoulder: Secondary | ICD-10-CM | POA: Diagnosis not present

## 2015-03-17 DIAGNOSIS — F419 Anxiety disorder, unspecified: Secondary | ICD-10-CM | POA: Diagnosis not present

## 2015-03-17 DIAGNOSIS — K219 Gastro-esophageal reflux disease without esophagitis: Secondary | ICD-10-CM | POA: Insufficient documentation

## 2015-03-17 DIAGNOSIS — F329 Major depressive disorder, single episode, unspecified: Secondary | ICD-10-CM | POA: Insufficient documentation

## 2015-03-17 DIAGNOSIS — G4701 Insomnia due to medical condition: Secondary | ICD-10-CM

## 2015-03-17 MED ORDER — AMITRIPTYLINE HCL 25 MG PO TABS: 25.0000 mg | ORAL_TABLET | Freq: Every day | ORAL | Status: DC
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
## 2015-03-17 NOTE — Patient Instructions (Signed)
TAKE MELOXICAM WITH FOOD   TAKE ELAVIL 20MG  UNTIL BOTTLE EMPTY (TWO 1OMG PILLS) THEN MOVE TO THE 25MG  PRESCRIPTION.    PLEASE CALL ME WITH ANY PROBLEMS OR QUESTIONS [id number]).

## 2015-03-26 ENCOUNTER — Telehealth: Payer: Self-pay | Admitting: Neurology

## 2015-03-26 MED ORDER — TOPIRAMATE 25 MG PO TABS
50.0000 mg | ORAL_TABLET | Freq: Two times a day (BID) | ORAL | Status: DC
Start: 2015-03-26 — End: 2015-04-10

## 2015-03-26 NOTE — Telephone Encounter (Signed)
Patient called to request refill of topiramate (TOPAMAX) 25 MG tablet

## 2015-04-02 ENCOUNTER — Telehealth: Payer: Self-pay

## 2015-04-02 MED ORDER — MELOXICAM 15 MG PO TABS: 15.0000 mg | ORAL_TABLET | Freq: Every day | ORAL | Status: DC

## 2015-04-02 NOTE — Telephone Encounter (Signed)
Pt is requesting her refill for Meloxicam. It was discussed last visit that ZS would take over rx. Will send in to pt's pharmacy.

## 2015-04-10 ENCOUNTER — Ambulatory Visit (INDEPENDENT_AMBULATORY_CARE_PROVIDER_SITE_OTHER): Payer: Medicaid Other | Admitting: Neurology

## 2015-04-10 ENCOUNTER — Encounter: Payer: Self-pay | Admitting: Neurology

## 2015-04-10 VITALS — BP 105/71 | HR 69 | Ht 68.0 in | Wt 207.5 lb

## 2015-04-10 DIAGNOSIS — R413 Other amnesia: Secondary | ICD-10-CM | POA: Diagnosis not present

## 2015-04-10 DIAGNOSIS — G43009 Migraine without aura, not intractable, without status migrainosus: Secondary | ICD-10-CM

## 2015-04-10 DIAGNOSIS — M797 Fibromyalgia: Secondary | ICD-10-CM | POA: Diagnosis not present

## 2015-04-10 DIAGNOSIS — G932 Benign intracranial hypertension: Secondary | ICD-10-CM | POA: Diagnosis not present

## 2015-04-10 MED ORDER — TOPIRAMATE 50 MG PO TABS: 50.0000 mg | ORAL_TABLET | Freq: Two times a day (BID) | ORAL | Status: DC

## 2015-04-10 MED ORDER — DULOXETINE HCL 30 MG PO CPEP: 30.0000 mg | ORAL_CAPSULE | Freq: Every day | ORAL | Status: DC

## 2015-04-10 NOTE — Patient Instructions (Signed)
Pseudotumor Cerebri  Pseudotumor cerebri, also called idiopathic intracranial hypertension, is a condition that occurs due to increased pressure within your skull.  Although some of the symptoms resemble those of a brain tumor, it is not a brain tumor. Symptoms occur when the increased pressure in your skull compresses brain structures. For example, pressure on the nerve responsible for vision (optic nerve) causes it to swell, resulting in visual symptoms.  Pseudotumor cerebri tends to occur in obese women younger than 45 years of age. However, men and children can also develop this condition.  SYMPTOMS   Symptoms of pseudotumor cerebri occur due to increased pressure within the skull. Symptoms may include:   · Headaches.  · Nausea and vomiting.  · Dizziness.  · High blood pressure.    · Ringing in the ears.  · Double or blurred vision.  · Brief episodes of complete loss of vision.  · Pain in the back, neck, or shoulders.  DIAGNOSIS   Pseudotumor cerebri is diagnosed through:  · A detailed eye exam, which can reveal a swollen optic nerve, as well as identifying issues such as blind spots in the vision.  · An MRI or CT scan to rule out other disorders that can cause similar symptoms, such as brain tumors.  · A spinal tap (lumbar puncture), which can demonstrate increased pressure within the skull.  TREATMENT   There are several ways that pseudotumor cerebri is treated, including:  · Medicines to decrease the production of spinal fluid and lower the pressure within your skull.  · Medicines to prevent or treat headaches.  · Surgery to create an opening in your optic nerve to allow excess fluid to drain out.  · Surgery to place drains (shunts) in your brain to remove excess fluid.  HOME CARE INSTRUCTIONS   · Take all medicines as directed by your health care provider.  · Go to all of your follow-up appointments.  · Lose weight if you are overweight.  SEEK MEDICAL CARE IF:  · Any symptoms come back.  · You develop  trouble with hearing, vision, balance, or your sense of smell.  · You cannot eat or drink what you need.  · You are more weak or tired than usual.    · You are losing weight without trying.  SEEK IMMEDIATE MEDICAL CARE IF:  · You have new symptoms such as vision problems or difficulty walking.    · You have a seizure.    · You have trouble breathing.    · You have a fever.        This information is not intended to replace advice given to you by your health care provider. Make sure you discuss any questions you have with your health care provider.     Document Released: 02/06/2013 Document Reviewed: 02/06/2013  Elsevier Interactive Patient Education ©2016 [employer]

## 2015-04-10 NOTE — Progress Notes (Signed)
Reason for visit: Pseudotumor cerebri  Brittney [name] is an 45 y.o. female  History of present illness:  Brittney [name] is a 45 year old right-handed black female with a history of pseudotumor cerebri, currently being treated with Topamax. She reports no headaches at this time, no significant issues with visual changes. The patient is followed through rehabilitation, Dr. Tessa Lerner is seeing the patient. He has recently added amitriptyline at night for her fibromyalgia symptoms. The patient is also on Cymbalta, and she takes Flexeril at night. She reports some memory issues and some daytime drowsiness on the medications. She is also on Lyrica. The patient tries to remain active during the day. 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 She has a history of a bipolar disorder. She returns to this office for an evaluation.  Past Medical History  Diagnosis Date  . Anxiety   . GERD (gastroesophageal reflux disease)   . 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 Headache(784.0)     MIGRAINES  . Arthritis     KNEES ELBOWS HIPS SHOULDER FINGERS  . Depression     BIPOLAR  . Bipolar disorder ([place])   . Obesity   . OA (osteoarthritis) of knee   . Asthma   . Dyspnea   . Back pain   . Neck pain   . Hyperlipidemia   . Fibromyalgia   . 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  . Memory difficulties 06/21/2014    Past Surgical History  Procedure Laterality Date  . Laparoscopy N/A 11/21/2012    Procedure: LAPAROSCOPY OPERATIVE REMOVAL OF RIGHT TUBE AND OVARY AND FLUID FROM MASS;  Surgeon: Melina Schools, MD;  Location: [facility];  Service: Gynecology;  Laterality: N/A;  1 1/2hrs OR time  . Oophorectomy    . Cardiac catheterization  2010  . Dilation and curettage of uterus  1994    Family History  Problem Relation Age of Onset  . Cancer - Lung Father   . Diabetes Father   . Cirrhosis Mother   . Heart disease Maternal Grandmother     . Alzheimer's disease Maternal Grandfather   . Cancer - Lung Paternal Grandmother   . Migraines Sister   . Stroke Sister   . Healthy Brother   . Healthy Sister   . Healthy Sister   . Healthy Sister   . Healthy Brother   . Healthy Brother   . Healthy Brother   . Healthy Brother   . Healthy Brother     Social history:  reports that she quit smoking about 12 years ago. She has never used smokeless tobacco. She reports that she does not drink alcohol or use illicit drugs.    Allergies  Allergen Reactions  . Nsaids     REACTION: edema   Pt says she can take ibuprofen    Medications:  Prior to Admission medications   Medication Sig Start Date End Date Taking? Authorizing Provider  amitriptyline (ELAVIL) 25 MG tablet Take 1 tablet (25 mg total) by mouth at bedtime. 03/17/15  Yes Meredith Staggers, MD  cyclobenzaprine (FLEXERIL) 10 MG tablet Take 10 mg by mouth every 6 (six) hours as needed for muscle spasms.   Yes Historical Provider, MD  DULoxetine (CYMBALTA) 30 MG capsule Take 1 capsule (30 mg total) by mouth daily. 10/08/14  Yes Kathrynn Ducking, MD  DULoxetine (CYMBALTA) 60 MG capsule Take 60 mg by mouth at bedtime.  09/14/14  Yes Historical Provider, MD  Etanercept (ENBREL [place]) Inject into the skin once a week.   Yes Historical Provider, MD  fluticasone (FLONASE) 50 MCG/ACT nasal spray Place 1 spray into both nostrils daily. 12/16/13  Yes Kerrie Buffalo, NP  folic acid (FOLVITE) 1 MG tablet Take 1 mg by mouth daily. 09/09/14 09/09/15 Yes Historical Provider, MD  HYDROcodone-acetaminophen (NORCO/VICODIN) 5-325 MG tablet Take 1 tablet by mouth as needed for moderate pain.   Yes Historical Provider, MD  meloxicam (MOBIC) 15 MG tablet Take 1 tablet (15 mg total) by mouth at bedtime. 04/02/15  Yes Meredith Staggers, MD  methotrexate (RHEUMATREX) 2.5 MG tablet Take 12.5 mg by mouth once a week. Takes 5 tablets twice daily only on Saturday. 09/09/14  Yes Historical Provider, MD  pregabalin  (LYRICA) 200 MG capsule Take 1 capsule (200 mg total) by mouth 2 (two) times daily. 02/04/15  Yes Meredith Staggers, MD  TERBINAFINE HCL PO Take 1 tablet by mouth daily.   Yes Historical Provider, MD  topiramate (TOPAMAX) 25 MG tablet Take 2 tablets (50 mg total) by mouth 2 (two) times daily. 03/26/15  Yes Kathrynn Ducking, MD  traMADol (ULTRAM) 50 MG tablet Take 50 mg by mouth every 6 (six) hours as needed for moderate pain.  06/19/14  Yes Historical Provider, MD    ROS:  Out of a complete 14 system review of symptoms, the patient complains only of the following symptoms, and all other reviewed systems are negative.  Fatigue Eye itching Excessive thirst Constipation Daytime sleepiness Frequent infections Difficulty urinating Joint pain, joint swelling, back pain, aching muscles, muscle cramps Dizziness, headache Depression  Blood pressure 105/71, pulse 69, height 5\' 8"  (1.727 m), weight 207 lb 8 oz (94.121 kg).  Physical Exam  General: The patient is alert and cooperative at the time of the examination. The patient is minimally to moderately obese.  Neuromuscular: Range of movement of the cervical and lumbar spine is full.  Skin: No significant peripheral edema is noted.   Neurologic Exam  Mental status: The patient is alert and oriented x 3 at the time of the examination. The patient has apparent normal recent and remote memory, with an apparently normal attention span and concentration ability.   Cranial nerves: Facial symmetry is present. Speech is normal, no aphasia or dysarthria is noted. Extraocular movements are full. Visual fields are full. Pupils are equal, round, and react to light. Discs show minimal blurring in the medial aspects bilaterally, no venous pulsations are seen.  Motor: The patient has good strength in all 4 extremities.  Sensory examination: Soft touch sensation is symmetric on the face, arms, and legs.  Coordination: The patient has decreased pinprick  and vibration sensation on the right face, arm, and leg. The patient splits the midline with vibration sensation on the forehead, decreased on the right.  Gait and station: The patient has a normal gait. Tandem gait is normal. Romberg is negative. No drift is seen.  Reflexes: Deep tendon reflexes are symmetric.   Assessment/Plan:  1. Pseudotumor cerebri  2. Fibromyalgia  3. Bipolar disorder  4. 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 She continues to report fibromyalgia symptoms and she seems to be doing well with her pseudotumor symptoms with no changes in vision or with headache. The patient will be given prescriptions for Topamax and for the Cymbalta, she will follow-up in 6  months, sooner if needed. Much of her excessive daytime drowsiness and cognitive issues may be related to medication effect.  Jill Alexanders MD 04/10/2015 3:35 PM  [facility] [address] [place], [place] [address]  Phone [phone] Fax [phone]

## 2015-04-21 ENCOUNTER — Encounter: Payer: Self-pay | Admitting: Physical Medicine & Rehabilitation

## 2015-04-21 ENCOUNTER — Encounter: Payer: Medicaid Other | Attending: Physical Medicine & Rehabilitation | Admitting: Physical Medicine & Rehabilitation

## 2015-04-21 VITALS — BP 103/59 | HR 80 | Resp 14

## 2015-04-21 DIAGNOSIS — G932 Benign intracranial hypertension: Secondary | ICD-10-CM | POA: Diagnosis not present

## 2015-04-21 DIAGNOSIS — F419 Anxiety disorder, unspecified: Secondary | ICD-10-CM | POA: Insufficient documentation

## 2015-04-21 DIAGNOSIS — Z87891 Personal history of nicotine dependence: Secondary | ICD-10-CM | POA: Insufficient documentation

## 2015-04-21 DIAGNOSIS — F329 Major depressive disorder, single episode, unspecified: Secondary | ICD-10-CM | POA: Insufficient documentation

## 2015-04-21 DIAGNOSIS — G8929 Other chronic pain: Secondary | ICD-10-CM | POA: Diagnosis not present

## 2015-04-21 DIAGNOSIS — M7581 Other shoulder lesions, right shoulder: Secondary | ICD-10-CM

## 2015-04-21 DIAGNOSIS — F319 Bipolar disorder, unspecified: Secondary | ICD-10-CM | POA: Insufficient documentation

## 2015-04-21 DIAGNOSIS — F4310 Post-traumatic stress disorder, unspecified: Secondary | ICD-10-CM | POA: Diagnosis not present

## 2015-04-21 DIAGNOSIS — J45909 Unspecified asthma, uncomplicated: Secondary | ICD-10-CM | POA: Diagnosis not present

## 2015-04-21 DIAGNOSIS — E669 Obesity, unspecified: Secondary | ICD-10-CM | POA: Insufficient documentation

## 2015-04-21 DIAGNOSIS — K219 Gastro-esophageal reflux disease without esophagitis: Secondary | ICD-10-CM | POA: Diagnosis not present

## 2015-04-21 DIAGNOSIS — E785 Hyperlipidemia, unspecified: Secondary | ICD-10-CM | POA: Insufficient documentation

## 2015-04-21 DIAGNOSIS — M7582 Other shoulder lesions, left shoulder: Secondary | ICD-10-CM | POA: Diagnosis not present

## 2015-04-21 DIAGNOSIS — G43909 Migraine, unspecified, not intractable, without status migrainosus: Secondary | ICD-10-CM | POA: Insufficient documentation

## 2015-04-21 DIAGNOSIS — M797 Fibromyalgia: Secondary | ICD-10-CM | POA: Diagnosis not present

## 2015-04-21 DIAGNOSIS — M069 Rheumatoid arthritis, unspecified: Secondary | ICD-10-CM | POA: Diagnosis not present

## 2015-04-21 DIAGNOSIS — R569 Unspecified convulsions: Secondary | ICD-10-CM | POA: Insufficient documentation

## 2015-04-21 DIAGNOSIS — M779 Enthesopathy, unspecified: Secondary | ICD-10-CM | POA: Diagnosis not present

## 2015-04-21 DIAGNOSIS — G4701 Insomnia due to medical condition: Secondary | ICD-10-CM

## 2015-04-21 DIAGNOSIS — R413 Other amnesia: Secondary | ICD-10-CM | POA: Diagnosis not present

## 2015-04-21 MED ORDER — DULOXETINE HCL 60 MG PO CPEP: 60.0000 mg | ORAL_CAPSULE | Freq: Every day | ORAL | Status: DC

## 2015-04-21 NOTE — Patient Instructions (Signed)
KEEP UP THE ROUTINE AND THE GOOD WORK!  FIND TIME TO HAVE FUN AND RELAXATION!!   CONTINUE WITH YOUR WALKING AND EXERCISE PROGRAM!!   PLEASE CALL ME WITH ANY PROBLEMS OR QUESTIONS [phone]).
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
## 2015-04-22 ENCOUNTER — Other Ambulatory Visit: Payer: Self-pay | Admitting: *Deleted

## 2015-04-22 MED ORDER — PREGABALIN 200 MG PO CAPS: 200.0000 mg | ORAL_CAPSULE | Freq: Two times a day (BID) | ORAL | Status: DC

## 2015-05-27 ENCOUNTER — Telehealth: Payer: Self-pay | Admitting: *Deleted

## 2015-05-27 MED ORDER — CYCLOBENZAPRINE HCL 10 MG PO TABS: 10.0000 mg | ORAL_TABLET | Freq: Four times a day (QID) | ORAL | Status: DC | PRN

## 2015-05-27 NOTE — Telephone Encounter (Signed)
i don't recall mentioning it either nor do i see it in the note. Can write an rx though. I've sent to the pharmacy.

## 2015-05-27 NOTE — Telephone Encounter (Signed)
Brittney [name] called and said that you were going to order flexeril for her and the pharmacy does not have the order. No mention in note.  Please advise.

## 2015-05-28 ENCOUNTER — Telehealth: Payer: Self-pay

## 2015-05-28 NOTE — Telephone Encounter (Signed)
Pt states that her Flexeril is not at pharmacy. On the electronic rx, it states that receipt was confirmed at 5:17 pm by pharmacy.  Advised pt to call pharmacy again to check that they have filled it.

## 2015-05-30 ENCOUNTER — Other Ambulatory Visit: Payer: Self-pay | Admitting: *Deleted

## 2015-05-30 NOTE — Telephone Encounter (Signed)
error 

## 2015-06-24 ENCOUNTER — Encounter: Payer: Medicaid Other | Admitting: Physical Medicine & Rehabilitation

## 2015-06-25 ENCOUNTER — Encounter: Payer: Self-pay | Admitting: Physical Medicine & Rehabilitation

## 2015-06-25 ENCOUNTER — Encounter: Payer: Medicaid Other | Attending: Physical Medicine & Rehabilitation | Admitting: Physical Medicine & Rehabilitation

## 2015-06-25 VITALS — BP 102/52 | HR 86 | Resp 16

## 2015-06-25 DIAGNOSIS — M7581 Other shoulder lesions, right shoulder: Secondary | ICD-10-CM

## 2015-06-25 DIAGNOSIS — J45909 Unspecified asthma, uncomplicated: Secondary | ICD-10-CM | POA: Insufficient documentation

## 2015-06-25 DIAGNOSIS — R569 Unspecified convulsions: Secondary | ICD-10-CM | POA: Insufficient documentation

## 2015-06-25 DIAGNOSIS — F329 Major depressive disorder, single episode, unspecified: Secondary | ICD-10-CM | POA: Insufficient documentation

## 2015-06-25 DIAGNOSIS — K219 Gastro-esophageal reflux disease without esophagitis: Secondary | ICD-10-CM | POA: Insufficient documentation

## 2015-06-25 DIAGNOSIS — M7522 Bicipital tendinitis, left shoulder: Secondary | ICD-10-CM

## 2015-06-25 DIAGNOSIS — F319 Bipolar disorder, unspecified: Secondary | ICD-10-CM | POA: Diagnosis not present

## 2015-06-25 DIAGNOSIS — M797 Fibromyalgia: Secondary | ICD-10-CM | POA: Diagnosis not present

## 2015-06-25 DIAGNOSIS — Z87891 Personal history of nicotine dependence: Secondary | ICD-10-CM | POA: Diagnosis not present

## 2015-06-25 DIAGNOSIS — G932 Benign intracranial hypertension: Secondary | ICD-10-CM | POA: Insufficient documentation

## 2015-06-25 DIAGNOSIS — F419 Anxiety disorder, unspecified: Secondary | ICD-10-CM | POA: Diagnosis not present

## 2015-06-25 DIAGNOSIS — M7582 Other shoulder lesions, left shoulder: Secondary | ICD-10-CM

## 2015-06-25 DIAGNOSIS — M47816 Spondylosis without myelopathy or radiculopathy, lumbar region: Secondary | ICD-10-CM | POA: Diagnosis not present

## 2015-06-25 DIAGNOSIS — E785 Hyperlipidemia, unspecified: Secondary | ICD-10-CM | POA: Insufficient documentation

## 2015-06-25 DIAGNOSIS — G8929 Other chronic pain: Secondary | ICD-10-CM | POA: Diagnosis not present

## 2015-06-25 DIAGNOSIS — M779 Enthesopathy, unspecified: Secondary | ICD-10-CM | POA: Insufficient documentation

## 2015-06-25 DIAGNOSIS — R413 Other amnesia: Secondary | ICD-10-CM | POA: Insufficient documentation

## 2015-06-25 DIAGNOSIS — E669 Obesity, unspecified: Secondary | ICD-10-CM | POA: Insufficient documentation

## 2015-06-25 DIAGNOSIS — F4310 Post-traumatic stress disorder, unspecified: Secondary | ICD-10-CM | POA: Diagnosis not present

## 2015-06-25 DIAGNOSIS — G43909 Migraine, unspecified, not intractable, without status migrainosus: Secondary | ICD-10-CM | POA: Diagnosis not present

## 2015-06-25 DIAGNOSIS — M069 Rheumatoid arthritis, unspecified: Secondary | ICD-10-CM | POA: Insufficient documentation

## 2015-06-25 NOTE — Patient Instructions (Addendum)
Biceps Tendon Tendinitis (Proximal) and Tenosynovitis With Rehab Tendonitis and tenosynovitis involve inflammation of the tendon and the tendon lining (sheath). The proximal biceps tendon is vulnerable to tendonitis and tenosynovitis, which causes pain and discomfort in the front of the shoulder and upper arm. The tendon lining secretes a fluid that helps lubricate the tendon, allowing for proper function without pain. When the tendon and its lining become inflamed, the tendon can no longer glide smoothly, causing pain. The proximal biceps tendon connects the biceps muscle to two bones of the shoulder. It is important for proper function of the elbow and turning the palm upward (supination) using the wrist. Proximal biceps tendon tendinitis may include a grade 1 or 2 strain of the tendon. Grade 1 strains involve a slight pull of the tendon without signs of tearing and no observed tendon lengthening. There is also no loss of strength. Grade 2 strains involve small tears in the tendon fibers. The tendon or muscle is stretched and strength is usually decreased.  SYMPTOMS   Pain, tenderness, swelling, warmth, or redness over the front of the shoulder.  Pain that gets worse with shoulder and elbow use, especially against resistance.  Limited motion of the shoulder or elbow.  Crackling sound (crepitation) when the tendon or shoulder is moved or touched. CAUSES  The symptoms of biceps tendonitis are due to inflammation of the tendon. Inflammation may be caused by:  Strain from sudden increase in amount or intensity of activity.  Direct blow or injury to the elbow (uncommon).  Overuse or repetitive elbow bending or wrist rotation, particularly when turning the palm up, or with elbow hyperextension. RISK INCREASES WITH:  Sports that involve contact or overhead arm activity (throwing sports, gymnastics, weightlifting, bodybuilding, rock climbing).  Heavy labor.  Poor strength and  flexibility.  Failure to warm up properly before activity. PREVENTION  Warm up and stretch properly before activity.  Allow time for recovery between activities.  Maintain physical fitness:  Strength, flexibility, and endurance.  Cardiovascular fitness.  Learn and use proper exercise technique. PROGNOSIS  With proper treatment, proximal biceps tendon tendonitis and tenosynovitis is usually curable within 6 weeks. Healing is usually quicker if the cause was a direct blow, not overuse.  RELATED COMPLICATIONS   Longer healing time if not properly treated or if not given enough time to heal.  Chronically inflamed tendon that causes persistent pain with activity, that may progress to constant pain and potentially rupture of the tendon.  Recurring symptoms, especially if activity is resumed too soon or with overuse, a direct blow, or use of poor exercise technique. TREATMENT Treatment first involves ice and medicine, to reduce pain and inflammation. It is helpful to modify activities that cause pain, to reduce the chances of causing the condition to get worse. Strengthening and stretching exercises should be performed to promote proper use of the muscles of the shoulder. These exercises may be performed at home or with a therapist. Other treatments may be given such as ultrasound or heat therapy. A corticosteroid injection may be recommended to help reduce inflammation of the tendon lining. Surgery is usually not necessary. Sometimes, if symptoms last for greater than 6 months, surgery will be advised to detach the tendon and re-insert it into the arm bone. Surgery to correct other shoulder problems that may be contributing to tendinitis may be advised before surgery for the tendinitis itself.  MEDICATION  If pain medicine is needed, nonsteroidal anti-inflammatory medicines (aspirin and ibuprofen), or other minor pain relievers (  acetaminophen), are often advised.  Do not take pain medicine  for 7 days before surgery.  Prescription pain relievers may be given if your caregiver thinks they are needed. Use only as directed and only as much as you need.  Corticosteroid injections may be given. These injections should only be used on the most severe cases, as one can only receive a limited number of them. HEAT AND COLD   Cold treatment (icing) should be applied for 10 to 15 minutes every 2 to 3 hours for inflammation and pain, and immediately after activity that aggravates your symptoms. Use ice packs or an ice massage.  Heat treatment may be used before performing stretching and strengthening activities prescribed by your caregiver, physical therapist, or athletic trainer. Use a heat pack or a warm water soak. SEEK MEDICAL CARE IF:   Symptoms get worse or do not improve in 2 weeks, despite treatment.  New, unexplained symptoms develop. (Drugs used in treatment may produce side effects.) EXERCISES RANGE OF MOTION (ROM) AND EXERCISES - Biceps Tendon (Proximal) and Tenosynovitis These exercises may help you when beginning to rehabilitate your injury. Your symptoms may go away with or without further involvement from your physician, physical therapist, or athletic trainer. While completing these exercises, remember:   Restoring tissue flexibility helps normal motion to return to the joints. This allows healthier, less painful movement and activity.  An effective stretch should be held for at least 30 seconds.  A stretch should never be painful. You should only feel a gentle lengthening or release in the stretched tissue. STRETCH - Flexion, Standing  Stand with good posture. With an underhand grip on your right / left hand and an overhand grip on the opposite hand, grasp a broomstick or cane so that your hands are a little more than shoulder width apart.  Keeping your right / left elbow straight and shoulder muscles relaxed, push the stick with your opposite hand to raise your right  / left arm in front of your body and then overhead. Raise your arm until you feel a stretch in your right / left shoulder, but before you have increased shoulder pain.  Try to avoid shrugging your right / left shoulder as your arm rises, by keeping your shoulder blade tucked down and toward your mid-back spine. Hold for __________ seconds.  Slowly return to the starting position. Repeat __________ times. Complete this exercise __________ times per day. STRETCH - Abduction, Supine  Lie on your back. With an underhand grip on your right / left hand and an overhand grip on the opposite hand, grasp a broomstick or cane so that your hands are a little more than shoulder width apart.  Keeping your right / left elbow straight and shoulder muscles relaxed, push the stick with your opposite hand to raise your right / left arm out to the side of your body and then overhead. Raise your arm until you feel a stretch in your right / left shoulder, but before you have increased shoulder pain.  Try to avoid shrugging your right / left shoulder as your arm rises, by keeping your shoulder blade tucked down and toward your mid-back spine. Hold for __________ seconds.  Slowly return to the starting position. Repeat __________ times. Complete this exercise __________ times per day. ROM - Flexion, Active-Assisted  Lie on your back. You may bend your knees for comfort.  Grasp a broomstick or cane so your hands are about shoulder width apart. Your right / left hand should   grip the end of the stick so that your hand is positioned "thumbs-up," as if you were about to shake hands.  Using your healthy arm to lead, raise your right / left arm overhead until you feel a gentle stretch in your shoulder. Hold for __________ seconds.  Use the stick to assist in returning your right / left arm to its starting position. Repeat __________ times. Complete this exercise __________ times per day.  STRETCH - Flexion, Standing    Stand facing a wall. Walk your right / left fingers up the wall until you feel a moderate stretch in your shoulder. As your hand gets higher, you may need to step closer to the wall or use a door frame to walk through.  Try to avoid shrugging your right / left shoulder as your arm rises, by keeping your shoulder blade tucked down and toward your mid-back spine.  Hold for __________ seconds. Use your other hand, if needed, to ease out of the stretch and return to the starting position. Repeat __________ times. Complete this exercise __________ times per day.  ROM - Internal Rotation   Using underhand grips, grasp a stick behind your back with both hands.  While standing upright with good posture, slide the stick up your back until you feel a mild stretch in the front of your shoulder.  Hold for __________ seconds. Slowly return to your starting position. Repeat __________ times. Complete this exercise __________ times per day.  STRETCH - Internal Rotation  Place your right / left hand behind your back, palm-up.  Throw a towel or belt over your opposite shoulder. Grasp the towel with your right / left hand.  While keeping an upright posture, gently pull up on the towel until you feel a stretch in the front of your right / left shoulder.  Avoid shrugging your right / left shoulder as your arm rises, by keeping your shoulder blade tucked down and toward your mid-back spine.  Hold for __________ seconds. Release the stretch by lowering your opposite hand. Repeat __________ times. Complete this exercise __________ times per day. STRENGTHENING EXERCISES - Biceps Tendon Tendinitis (Proximal) and Tenosynovitis These exercises may help you regain your strength after your physician has discontinued your restraint in a cast or brace. They may resolve your symptoms with or without further involvement from your physician, physical therapist or athletic trainer. While completing these exercises,  remember:   Muscles can gain both the endurance and the strength needed for everyday activities through controlled exercises.  Complete these exercises as instructed by your physician, physical therapist or athletic trainer. Increase the resistance and repetitions only as guided.  You may experience muscle soreness or fatigue, but the pain or discomfort you are trying to eliminate should never worsen during these exercises. If this pain does get worse, stop and make sure you are following the directions exactly. If the pain is still present after adjustments, discontinue the exercise until you can discuss the trouble with your caregiver. STRENGTH - Elbow Flexors, Isometric  Stand or sit upright on a firm surface. Place your right / left arm so that your hand is palm-up and at the height of your waist.  Place your opposite hand on top of your forearm. Gently push down as your right / left arm resists. Push as hard as you can with both arms, without causing any pain or movement at your right / left elbow. Hold this stationary position for __________ seconds.  Gradually release the tension in both   arms. Allow your muscles to relax completely before repeating. Repeat __________ times. Complete this exercise __________ times per day. STRENGTH - Shoulder Flexion, Isometric  With good posture and facing a wall, stand or sit about 4-6 inches away.  Keeping your right / left elbow straight, gently press the top of your fist into the wall. Increase the pressure gradually until you are pressing as hard as you can, without shrugging your shoulder or increasing any shoulder discomfort.  Hold for __________ seconds.  Release the tension slowly. Relax your shoulder muscles completely before you start the next repetition. Repeat __________ times. Complete this exercise __________ times per day.  STRENGTH - Elbow Flexors, Supinated  With good posture, stand or sit on a firm chair without armrests. Allow  your right / left arm to rest at your side with your palm facing forward.  Holding a __________ weight, or gripping a rubber exercise band or tubing,  bring your hand toward your shoulder.  Allow your muscles to control the resistance as your hand returns to your side. Repeat __________ times. Complete this exercise __________ times per day.  STRENGTH - Shoulder Flexion  Stand or sit with good posture. Grasp a __________ weight, or an exercise band or tubing, so that your hand is "thumbs-up," like when you shake hands.  Slowly lift your right / left arm as far as you can, without increasing any shoulder pain. At first, many people can only raise their hand to shoulder height.  Avoid shrugging your right / left shoulder as your arm rises, by keeping your shoulder blade tucked down and toward your mid-back spine.  Hold for __________ seconds. Control the descent of your hand as you slowly return to your starting position. Repeat __________ times. Complete this exercise __________ times per day.   This information is not intended to replace advice given to you by your health care provider. Make sure you discuss any questions you have with your health care provider.   Document Released: 02/01/2005 Document Revised: 02/22/2014 Document Reviewed: 05/16/2008 [employer] Interactive Patient Education 2016 [place] [facility] A sprain is an injury in which a ligament is torn. The ligaments of the lower back are vulnerable to sprains. However, they are strong and require great force to be injured. These ligaments are important for stabilizing the spinal column. Sprains are classified into three categories. Grade 1 sprains cause pain, but the tendon is not lengthened. Grade 2 sprains include a lengthened ligament, due to the ligament being stretched or partially ruptured. With grade 2 sprains there is still function, although the function may be decreased. Grade 3 sprains involve a  complete tear of the tendon or muscle, and function is usually impaired. SYMPTOMS   Severe pain in the lower back.  Sometimes, a feeling of a "pop," "snap," or tear, at the time of injury.  Tenderness and sometimes swelling at the injury site.  Uncommonly, bruising (contusion) within 48 hours of injury.  Muscle spasms in the back. CAUSES  Low back sprains occur when a force is placed on the ligaments that is greater than they can handle. Common causes of injury include:  Performing a stressful act while off-balance.  Repetitive stressful activities that involve movement of the lower back.  Direct hit (trauma) to the lower back. RISK INCREASES WITH:  Contact sports (football, wrestling).  Collisions (major skiing accidents).  Sports that require throwing or lifting (baseball, weightlifting).  Sports involving twisting of the spine (gymnastics, diving, tennis, golf).  Poor strength and flexibility.  Inadequate protection.  Previous back injury or surgery (especially fusion). PREVENTION  Wear properly fitted and padded protective equipment.  Warm up and stretch properly before activity.  Allow for adequate recovery between workouts.  Maintain physical fitness:  Strength, flexibility, and endurance.  Cardiovascular fitness.  Maintain a healthy body weight. PROGNOSIS  If treated properly, low back sprains usually heal with non-surgical treatment. The length of time for healing depends on the severity of the injury.  RELATED COMPLICATIONS   Recurring symptoms, resulting in a chronic problem.  Chronic inflammation and pain in the low back.  Delayed healing or resolution of symptoms, especially if activity is resumed too soon.  Prolonged impairment.  Unstable or arthritic joints of the low back. TREATMENT  Treatment first involves the use of ice and medicine, to reduce pain and inflammation. The use of strengthening and stretching exercises may help reduce pain  with activity. These exercises may be performed at home or with a therapist. Severe injuries may require referral to a therapist for further evaluation and treatment, such as ultrasound. Your caregiver may advise that you wear a back brace or corset, to help reduce pain and discomfort. Often, prolonged bed rest results in greater harm then benefit. Corticosteroid injections may be recommended. However, these should be reserved for the most serious cases. It is important to avoid using your back when lifting objects. At night, sleep on your back on a firm mattress, with a pillow placed under your knees. If non-surgical treatment is unsuccessful, surgery may be needed.  MEDICATION   If pain medicine is needed, nonsteroidal anti-inflammatory medicines (aspirin and ibuprofen), or other minor pain relievers (acetaminophen), are often advised.  Do not take pain medicine for 7 days before surgery.  Prescription pain relievers may be given, if your caregiver thinks they are needed. Use only as directed and only as much as you need.  Ointments applied to the skin may be helpful.  Corticosteroid injections may be given by your caregiver. These injections should be reserved for the most serious cases, because they may only be given a certain number of times. HEAT AND COLD  Cold treatment (icing) should be applied for 10 to 15 minutes every 2 to 3 hours for inflammation and pain, and immediately after activity that aggravates your symptoms. Use ice packs or an ice massage.  Heat treatment may be used before performing stretching and strengthening activities prescribed by your caregiver, physical therapist, or athletic trainer. Use a heat pack or a warm water soak. SEEK MEDICAL CARE IF:   Symptoms get worse or do not improve in 2 to 4 weeks, despite treatment.  You develop numbness or weakness in either leg.  You lose bowel or bladder function.  Any of the following occur after surgery: fever, increased  pain, swelling, redness, drainage of fluids, or bleeding in the affected area.  New, unexplained symptoms develop. (Drugs used in treatment may produce side effects.) EXERCISES  RANGE OF MOTION (ROM) AND STRETCHING EXERCISES - Low Back Sprain Most people with lower back pain will find that their symptoms get worse with excessive bending forward (flexion) or arching at the lower back (extension). The exercises that will help resolve your symptoms will focus on the opposite motion.  Your physician, physical therapist or athletic trainer will help you determine which exercises will be most helpful to resolve your lower back pain. Do not complete any exercises without first consulting with your caregiver. Discontinue any exercises which make your  symptoms worse, until you speak to your caregiver. If you have pain, numbness or tingling which travels down into your buttocks, leg or foot, the goal of the therapy is for these symptoms to move closer to your back and eventually resolve. Sometimes, these leg symptoms will get better, but your lower back pain may worsen. This is often an indication of progress in your rehabilitation. Be very alert to any changes in your symptoms and the activities in which you participated in the 24 hours prior to the change. Sharing this information with your caregiver will allow him or her to most efficiently treat your condition. These exercises may help you when beginning to rehabilitate your injury. Your symptoms may resolve with or without further involvement from your physician, physical therapist or athletic trainer. While completing these exercises, remember:   Restoring tissue flexibility helps normal motion to return to the joints. This allows healthier, less painful movement and activity.  An effective stretch should be held for at least 30 seconds.  A stretch should never be painful. You should only feel a gentle lengthening or release in the stretched  tissue. FLEXION RANGE OF MOTION AND STRETCHING EXERCISES: STRETCH - Flexion, Single Knee to Chest   Lie on a firm bed or floor with both legs extended in front of you.  Keeping one leg in contact with the floor, bring your opposite knee to your chest. Hold your leg in place by either grabbing behind your thigh or at your knee.  Pull until you feel a gentle stretch in your low back. Hold __________ seconds.  Slowly release your grasp and repeat the exercise with the opposite side. Repeat __________ times. Complete this exercise __________ times per day.  STRETCH - Flexion, Double Knee to Chest  Lie on a firm bed or floor with both legs extended in front of you.  Keeping one leg in contact with the floor, bring your opposite knee to your chest.  Tense your stomach muscles to support your back and then lift your other knee to your chest. Hold your legs in place by either grabbing behind your thighs or at your knees.  Pull both knees toward your chest until you feel a gentle stretch in your low back. Hold __________ seconds.  Tense your stomach muscles and slowly return one leg at a time to the floor. Repeat __________ times. Complete this exercise __________ times per day.  STRETCH - Low Trunk Rotation  Lie on a firm bed or floor. Keeping your legs in front of you, bend your knees so they are both pointed toward the ceiling and your feet are flat on the floor.  Extend your arms out to the side. This will stabilize your upper body by keeping your shoulders in contact with the floor.  Gently and slowly drop both knees together to one side until you feel a gentle stretch in your low back. Hold for __________ seconds.  Tense your stomach muscles to support your lower back as you bring your knees back to the starting position. Repeat the exercise to the other side. Repeat __________ times. Complete this exercise __________ times per day  EXTENSION RANGE OF MOTION AND FLEXIBILITY  EXERCISES: STRETCH - Extension, Prone on Elbows   Lie on your stomach on the floor, a bed will be too soft. Place your palms about shoulder width apart and at the height of your head.  Place your elbows under your shoulders. If this is too painful, stack pillows under your chest.  Allow your body to relax so that your hips drop lower and make contact more completely with the floor.  Hold this position for __________ seconds.  Slowly return to lying flat on the floor. Repeat __________ times. Complete this exercise __________ times per day.  RANGE OF MOTION - Extension, Prone Press Ups  Lie on your stomach on the floor, a bed will be too soft. Place your palms about shoulder width apart and at the height of your head.  Keeping your back as relaxed as possible, slowly straighten your elbows while keeping your hips on the floor. You may adjust the placement of your hands to maximize your comfort. As you gain motion, your hands will come more underneath your shoulders.  Hold this position __________ seconds.  Slowly return to lying flat on the floor. Repeat __________ times. Complete this exercise __________ times per day.  RANGE OF MOTION- Quadruped, Neutral Spine   Assume a hands and knees position on a firm surface. Keep your hands under your shoulders and your knees under your hips. You may place padding under your knees for comfort.  Drop your head and point your tailbone toward the ground below you. This will round out your lower back like an angry cat. Hold this position for __________ seconds.  Slowly lift your head and release your tail bone so that your back sags into a large arch, like an old horse.  Hold this position for __________ seconds.  Repeat this until you feel limber in your low back.  Now, find your "sweet spot." This will be the most comfortable position somewhere between the two previous positions. This is your neutral spine. Once you have found this position,  tense your stomach muscles to support your low back.  Hold this position for __________ seconds. Repeat __________ times. Complete this exercise __________ times per day.  STRENGTHENING EXERCISES - Low Back Sprain These exercises may help you when beginning to rehabilitate your injury. These exercises should be done near your "sweet spot." This is the neutral, low-back arch, somewhere between fully rounded and fully arched, that is your least painful position. When performed in this safe range of motion, these exercises can be used for people who have either a flexion or extension based injury. These exercises may resolve your symptoms with or without further involvement from your physician, physical therapist or athletic trainer. While completing these exercises, remember:   Muscles can gain both the endurance and the strength needed for everyday activities through controlled exercises.  Complete these exercises as instructed by your physician, physical therapist or athletic trainer. Increase the resistance and repetitions only as guided.  You may experience muscle soreness or fatigue, but the pain or discomfort you are trying to eliminate should never worsen during these exercises. If this pain does worsen, stop and make certain you are following the directions exactly. If the pain is still present after adjustments, discontinue the exercise until you can discuss the trouble with your caregiver. STRENGTHENING - Deep Abdominals, Pelvic Tilt   Lie on a firm bed or floor. Keeping your legs in front of you, bend your knees so they are both pointed toward the ceiling and your feet are flat on the floor.  Tense your lower abdominal muscles to press your low back into the floor. This motion will rotate your pelvis so that your tail bone is scooping upwards rather than pointing at your feet or into the floor. With a gentle tension and even breathing, hold this position  for __________ seconds. Repeat  __________ times. Complete this exercise __________ times per day.  STRENGTHENING - Abdominals, Crunches   Lie on a firm bed or floor. Keeping your legs in front of you, bend your knees so they are both pointed toward the ceiling and your feet are flat on the floor. Cross your arms over your chest.  Slightly tip your chin down without bending your neck.  Tense your abdominals and slowly lift your trunk high enough to just clear your shoulder blades. Lifting higher can put excessive stress on the lower back and does not further strengthen your abdominal muscles.  Control your return to the starting position. Repeat __________ times. Complete this exercise __________ times per day.  STRENGTHENING - Quadruped, Opposite UE/LE Lift   Assume a hands and knees position on a firm surface. Keep your hands under your shoulders and your knees under your hips. You may place padding under your knees for comfort.  Find your neutral spine and gently tense your abdominal muscles so that you can maintain this position. Your shoulders and hips should form a rectangle that is parallel with the floor and is not twisted.  Keeping your trunk steady, lift your right hand no higher than your shoulder and then your left leg no higher than your hip. Make sure you are not holding your breath. Hold this position for __________ seconds.  Continuing to keep your abdominal muscles tense and your back steady, slowly return to your starting position. Repeat with the opposite arm and leg. Repeat __________ times. Complete this exercise __________ times per day.  STRENGTHENING - Abdominals and Quadriceps, Straight Leg Raise   Lie on a firm bed or floor with both legs extended in front of you.  Keeping one leg in contact with the floor, bend the other knee so that your foot can rest flat on the floor.  Find your neutral spine, and tense your abdominal muscles to maintain your spinal position throughout the  exercise.  Slowly lift your straight leg off the floor about 6 inches for a count of 15, making sure to not hold your breath.  Still keeping your neutral spine, slowly lower your leg all the way to the floor. Repeat this exercise with each leg __________ times. Complete this exercise __________ times per day. POSTURE AND BODY MECHANICS CONSIDERATIONS - Low Back Sprain Keeping correct posture when sitting, standing or completing your activities will reduce the stress put on different body tissues, allowing injured tissues a chance to heal and limiting painful experiences. The following are general guidelines for improved posture. Your physician or physical therapist will provide you with any instructions specific to your needs. While reading these guidelines, remember:  The exercises prescribed by your provider will help you have the flexibility and strength to maintain correct postures.  The correct posture provides the best environment for your joints to work. All of your joints have less wear and tear when properly supported by a spine with good posture. This means you will experience a healthier, less painful body.  Correct posture must be practiced with all of your activities, especially prolonged sitting and standing. Correct posture is as important when doing repetitive low-stress activities (typing) as it is when doing a single heavy-load activity (lifting). RESTING POSITIONS Consider which positions are most painful for you when choosing a resting position. If you have pain with flexion-based activities (sitting, bending, stooping, squatting), choose a position that allows you to rest in a less flexed posture. You would  want to avoid curling into a fetal position on your side. If your pain worsens with extension-based activities (prolonged standing, working overhead), avoid resting in an extended position such as sleeping on your stomach. Most people will find more comfort when they rest with  their spine in a more neutral position, neither too rounded nor too arched. Lying on a non-sagging bed on your side with a pillow between your knees, or on your back with a pillow under your knees will often provide some relief. Keep in mind, being in any one position for a prolonged period of time, no matter how correct your posture, can still lead to stiffness. PROPER SITTING POSTURE In order to minimize stress and discomfort on your spine, you must sit with correct posture. Sitting with good posture should be effortless for a healthy body. Returning to good posture is a gradual process. Many people can work toward this most comfortably by using various supports until they have the flexibility and strength to maintain this posture on their own. When sitting with proper posture, your ears will fall over your shoulders and your shoulders will fall over your hips. You should use the back of the chair to support your upper back. Your lower back will be in a neutral position, just slightly arched. You may place a small pillow or folded towel at the base of your lower back for  support.  When working at a desk, create an environment that supports good, upright posture. Without extra support, muscles tire, which leads to excessive strain on joints and other tissues. Keep these recommendations in mind: CHAIR:  A chair should be able to slide under your desk when your back makes contact with the back of the chair. This allows you to work closely.  The chair's height should allow your eyes to be level with the upper part of your monitor and your hands to be slightly lower than your elbows. BODY POSITION  Your feet should make contact with the floor. If this is not possible, use a foot rest.  Keep your ears over your shoulders. This will reduce stress on your neck and low back. INCORRECT SITTING POSTURES  If you are feeling tired and unable to assume a healthy sitting posture, do not slouch or slump. This  puts excessive strain on your back tissues, causing more damage and pain. Healthier options include:  Using more support, like a lumbar pillow.  Switching tasks to something that requires you to be upright or walking.  Talking a brief walk.  Lying down to rest in a neutral-spine position. PROLONGED STANDING WHILE SLIGHTLY LEANING FORWARD  When completing a task that requires you to lean forward while standing in one place for a long time, place either foot up on a stationary 2-4 inch high object to help maintain the best posture. When both feet are on the ground, the lower back tends to lose its slight inward curve. If this curve flattens (or becomes too large), then the back and your other joints will experience too much stress, tire more quickly, and can cause pain. CORRECT STANDING POSTURES Proper standing posture should be assumed with all daily activities, even if they only take a few moments, like when brushing your teeth. As in sitting, your ears should fall over your shoulders and your shoulders should fall over your hips. You should keep a slight tension in your abdominal muscles to brace your spine. Your tailbone should point down to the ground, not behind your body, resulting  in an over-extended swayback posture.  INCORRECT STANDING POSTURES  Common incorrect standing postures include a forward head, locked knees and/or an excessive swayback. WALKING Walk with an upright posture. Your ears, shoulders and hips should all line-up. PROLONGED ACTIVITY IN A FLEXED POSITION When completing a task that requires you to bend forward at your waist or lean over a low surface, try to find a way to stabilize 3 out of 4 of your limbs. You can place a hand or elbow on your thigh or rest a knee on the surface you are reaching across. This will provide you more stability, so that your muscles do not tire as quickly. By keeping your knees relaxed, or slightly bent, you will also reduce stress across your  lower back. CORRECT LIFTING TECHNIQUES DO :  Assume a wide stance. This will provide you more stability and the opportunity to get as close as possible to the object which you are lifting.  Tense your abdominals to brace your spine. Bend at the knees and hips. Keeping your back locked in a neutral-spine position, lift using your leg muscles. Lift with your legs, keeping your back straight.  Test the weight of unknown objects before attempting to lift them.  Try to keep your elbows locked down at your sides in order get the best strength from your shoulders when carrying an object.  Always ask for help when lifting heavy or awkward objects. INCORRECT LIFTING TECHNIQUES DO NOT:   Lock your knees when lifting, even if it is a small object.  Bend and twist. Pivot at your feet or move your feet when needing to change directions.  Assume that you can safely pick up even a paperclip without proper posture.   This information is not intended to replace advice given to you by your health care provider. Make sure you discuss any questions you have with your health care provider.   Document Released: 02/01/2005 Document Revised: 02/22/2014 Document Reviewed: 05/16/2008 Elsevier Interactive Patient Education [employer].
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
## 2015-07-04 ENCOUNTER — Ambulatory Visit
Admission: RE | Admit: 2015-07-04 | Discharge: 2015-07-04 | Disposition: A | Discharge status: Home / Self Care | Payer: Medicaid Other | Source: Ambulatory Visit | Attending: Physical Medicine & Rehabilitation | Admitting: Physical Medicine & Rehabilitation

## 2015-07-04 DIAGNOSIS — M47816 Spondylosis without myelopathy or radiculopathy, lumbar region: Secondary | ICD-10-CM

## 2015-07-07 ENCOUNTER — Other Ambulatory Visit: Payer: Self-pay

## 2015-07-08 ENCOUNTER — Telehealth: Payer: Self-pay | Admitting: Physical Medicine & Rehabilitation

## 2015-07-08 NOTE — Telephone Encounter (Signed)
Please forward lumbar xray results to Mrs. [name]. Her study was essentially normal. I have reviewed the images.  Report: Minimal with degenerative change noted diffusely. No acute bony abnormality identified. No evidence of fracture or malalignment. Bony mineralization is normal .   thx

## 2015-07-08 NOTE — Telephone Encounter (Signed)
Pt advised of x-ray results.  

## 2015-07-22 ENCOUNTER — Other Ambulatory Visit: Payer: Self-pay | Admitting: Family Medicine

## 2015-07-22 DIAGNOSIS — G8929 Other chronic pain: Secondary | ICD-10-CM

## 2015-07-22 DIAGNOSIS — M797 Fibromyalgia: Secondary | ICD-10-CM

## 2015-07-22 DIAGNOSIS — M7581 Other shoulder lesions, right shoulder: Secondary | ICD-10-CM

## 2015-07-22 DIAGNOSIS — M7582 Other shoulder lesions, left shoulder: Secondary | ICD-10-CM

## 2015-07-22 DIAGNOSIS — G4701 Insomnia due to medical condition: Secondary | ICD-10-CM

## 2015-07-22 MED ORDER — AMITRIPTYLINE HCL 25 MG PO TABS: 25.0000 mg | ORAL_TABLET | Freq: Every day | ORAL | Status: DC

## 2015-07-30 ENCOUNTER — Telehealth: Payer: Self-pay | Admitting: *Deleted

## 2015-07-30 NOTE — Telephone Encounter (Signed)
Pt records, faxed to [facility] on 07/30/15.

## 2015-08-20 ENCOUNTER — Encounter: Payer: Medicaid Other | Admitting: Physical Medicine & Rehabilitation

## 2015-09-10 ENCOUNTER — Other Ambulatory Visit: Payer: Self-pay | Admitting: *Deleted

## 2015-09-10 DIAGNOSIS — M7582 Other shoulder lesions, left shoulder: Secondary | ICD-10-CM

## 2015-09-10 DIAGNOSIS — M797 Fibromyalgia: Secondary | ICD-10-CM

## 2015-09-10 DIAGNOSIS — G4701 Insomnia due to medical condition: Secondary | ICD-10-CM

## 2015-09-10 DIAGNOSIS — G8929 Other chronic pain: Secondary | ICD-10-CM

## 2015-09-10 DIAGNOSIS — M7581 Other shoulder lesions, right shoulder: Secondary | ICD-10-CM

## 2015-09-10 MED ORDER — DULOXETINE HCL 60 MG PO CPEP: 60.0000 mg | ORAL_CAPSULE | Freq: Every day | ORAL | 5 refills | Status: DC

## 2015-09-10 MED ORDER — CYCLOBENZAPRINE HCL 10 MG PO TABS: 10.0000 mg | ORAL_TABLET | Freq: Four times a day (QID) | ORAL | 2 refills | Status: DC | PRN

## 2015-09-10 MED ORDER — TOPIRAMATE 50 MG PO TABS: 50.0000 mg | ORAL_TABLET | Freq: Two times a day (BID) | ORAL | 3 refills | Status: DC

## 2015-09-30 ENCOUNTER — Encounter: Payer: Medicaid Other | Attending: Physical Medicine & Rehabilitation | Admitting: Physical Medicine & Rehabilitation

## 2015-09-30 ENCOUNTER — Encounter: Payer: Self-pay | Admitting: Physical Medicine & Rehabilitation

## 2015-09-30 VITALS — BP 114/80 | HR 65 | Resp 17

## 2015-09-30 DIAGNOSIS — F489 Nonpsychotic mental disorder, unspecified: Secondary | ICD-10-CM | POA: Diagnosis not present

## 2015-09-30 DIAGNOSIS — F329 Major depressive disorder, single episode, unspecified: Secondary | ICD-10-CM | POA: Diagnosis not present

## 2015-09-30 DIAGNOSIS — E785 Hyperlipidemia, unspecified: Secondary | ICD-10-CM | POA: Insufficient documentation

## 2015-09-30 DIAGNOSIS — M069 Rheumatoid arthritis, unspecified: Secondary | ICD-10-CM | POA: Insufficient documentation

## 2015-09-30 DIAGNOSIS — M47816 Spondylosis without myelopathy or radiculopathy, lumbar region: Secondary | ICD-10-CM | POA: Diagnosis not present

## 2015-09-30 DIAGNOSIS — E669 Obesity, unspecified: Secondary | ICD-10-CM | POA: Diagnosis not present

## 2015-09-30 DIAGNOSIS — G932 Benign intracranial hypertension: Secondary | ICD-10-CM | POA: Insufficient documentation

## 2015-09-30 DIAGNOSIS — K219 Gastro-esophageal reflux disease without esophagitis: Secondary | ICD-10-CM | POA: Insufficient documentation

## 2015-09-30 DIAGNOSIS — F4310 Post-traumatic stress disorder, unspecified: Secondary | ICD-10-CM

## 2015-09-30 DIAGNOSIS — R569 Unspecified convulsions: Secondary | ICD-10-CM | POA: Diagnosis not present

## 2015-09-30 DIAGNOSIS — Z87891 Personal history of nicotine dependence: Secondary | ICD-10-CM | POA: Diagnosis not present

## 2015-09-30 DIAGNOSIS — M779 Enthesopathy, unspecified: Secondary | ICD-10-CM | POA: Diagnosis not present

## 2015-09-30 DIAGNOSIS — R413 Other amnesia: Secondary | ICD-10-CM | POA: Insufficient documentation

## 2015-09-30 DIAGNOSIS — F319 Bipolar disorder, unspecified: Secondary | ICD-10-CM | POA: Diagnosis not present

## 2015-09-30 DIAGNOSIS — G8929 Other chronic pain: Secondary | ICD-10-CM | POA: Insufficient documentation

## 2015-09-30 DIAGNOSIS — J45909 Unspecified asthma, uncomplicated: Secondary | ICD-10-CM | POA: Insufficient documentation

## 2015-09-30 DIAGNOSIS — F419 Anxiety disorder, unspecified: Secondary | ICD-10-CM | POA: Diagnosis not present

## 2015-09-30 DIAGNOSIS — M797 Fibromyalgia: Secondary | ICD-10-CM | POA: Diagnosis not present

## 2015-09-30 DIAGNOSIS — G43909 Migraine, unspecified, not intractable, without status migrainosus: Secondary | ICD-10-CM | POA: Insufficient documentation

## 2015-09-30 DIAGNOSIS — F5105 Insomnia due to other mental disorder: Secondary | ICD-10-CM

## 2015-09-30 MED ORDER — MELOXICAM 15 MG PO TABS: 15.0000 mg | ORAL_TABLET | Freq: Every day | ORAL | 4 refills | Status: DC

## 2015-09-30 NOTE — Patient Instructions (Signed)
PLEASE CALL ME WITH ANY PROBLEMS OR QUESTIONS ([phone])  
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
## 2015-10-07 ENCOUNTER — Telehealth: Payer: Self-pay | Admitting: Adult Health

## 2015-10-07 NOTE — Telephone Encounter (Signed)
Patient called 4:18pm to cancel tomorrow's 10:00am appointment with NP, Jinny Blossom. States she has class and has a strict attendance policy. Patient advised of No Show/Cancel < 24 hour policy. Appointment rescheduled to Thursday August 24th 3:30pm to arrive 3:15pm. Patient understands and agrees.

## 2015-10-08 ENCOUNTER — Ambulatory Visit: Payer: Medicaid Other | Admitting: Adult Health

## 2015-10-09 ENCOUNTER — Ambulatory Visit (INDEPENDENT_AMBULATORY_CARE_PROVIDER_SITE_OTHER): Payer: Medicaid Other | Admitting: Adult Health

## 2015-10-09 ENCOUNTER — Encounter: Payer: Self-pay | Admitting: Adult Health

## 2015-10-09 VITALS — BP 105/69 | HR 71 | Ht 68.0 in | Wt 215.8 lb

## 2015-10-09 DIAGNOSIS — M797 Fibromyalgia: Secondary | ICD-10-CM

## 2015-10-09 DIAGNOSIS — G932 Benign intracranial hypertension: Secondary | ICD-10-CM

## 2015-10-09 MED ORDER — TOPIRAMATE 50 MG PO TABS: 75.0000 mg | ORAL_TABLET | Freq: Two times a day (BID) | ORAL | 5 refills | Status: DC

## 2015-10-09 NOTE — Progress Notes (Signed)
I have read the note, and I agree with the clinical assessment and plan.  Curry Dulski KEITH   

## 2015-10-09 NOTE — Progress Notes (Signed)
PATIENT: Brittney [name] DOB: 05-02-1970  REASON FOR VISIT: follow up- pseudotumor cerebri, fibromyalgia HISTORY FROM: patient  HISTORY OF PRESENT ILLNESS: [name] is a 45 year old female with a history of pseudotumor cerebri. She returns today for follow-up. She reports that in the last several months she has noted breakthrough symptoms. She describes the symptoms as tingling on the right side of the face. She states that it is not constant but rather intermittent. She states that she had the same symptoms when she was initially diagnosed with pseudotumor cerebri. She seems to think that this correlates to when Dr. Jannifer Franklin changed her to the 50 mg tablet of topamax although this was not an increase nor decrease in medication. She also notes that in the past when she has stopped Topamax the same symptoms occurred. She states that she has followed up with her ophthalmologist who did not note any papilledema. She also has a diagnosis of fibromyalgia and is on Cymbalta. She returns today for an evaluation.  HISTORY 04/10/15: [name] is a 45 year old right-handed black female with a history of pseudotumor cerebri, currently being treated with Topamax. She reports no headaches at this time, no significant issues with visual changes. The patient is followed through rehabilitation, Dr. Tessa Lerner is seeing the patient. He has recently added amitriptyline at night for her fibromyalgia symptoms. The patient is also on Cymbalta, and she takes Flexeril at night. She reports some memory issues and some daytime drowsiness on the medications. She is also on Lyrica. The patient tries to remain active during the day. 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 She has a history of a bipolar disorder. She returns to this office for an evaluation.  REVIEW OF SYSTEMS: Out of a complete 14 system review of symptoms, the patient complains only of the following  symptoms, and all other reviewed systems are negative.  See history of present illness  ALLERGIES: Allergies  Allergen Reactions  . Nsaids     REACTION: edema   Pt says she can take ibuprofen    HOME MEDICATIONS: Outpatient Medications Prior to Visit  Medication Sig Dispense Refill  . amitriptyline (ELAVIL) 25 MG tablet Take 1 tablet (25 mg total) by mouth at bedtime. 30 tablet 4  . cyclobenzaprine (FLEXERIL) 10 MG tablet Take 1 tablet (10 mg total) by mouth every 6 (six) hours as needed for muscle spasms. 40 tablet 2  . DULoxetine (CYMBALTA) 60 MG capsule Take 1 capsule (60 mg total) by mouth at bedtime. 30 capsule 5  . Etanercept (ENBREL [place]) Inject into the skin once a week.    . fluticasone (FLONASE) 50 MCG/ACT nasal spray Place 1 spray into both nostrils daily.  2  . meloxicam (MOBIC) 15 MG tablet Take 1 tablet (15 mg total) by mouth at bedtime. 30 tablet 4  . methotrexate (RHEUMATREX) 2.5 MG tablet Take 12.5 mg by mouth once a week. Takes 5 tablets twice daily only on Saturday.    . pregabalin (LYRICA) 200 MG capsule Take 1 capsule (200 mg total) by mouth 2 (two) times daily. 60 capsule 3  . TERBINAFINE HCL PO Take 1 tablet by mouth daily as needed.     . topiramate (TOPAMAX) 50 MG tablet Take 1 tablet (50 mg total) by mouth 2 (two) times daily. 180 tablet 3   No facility-administered medications prior to visit.     PAST MEDICAL HISTORY: Past Medical History:  Diagnosis Date  . Anxiety   .  Arthritis    KNEES ELBOWS HIPS SHOULDER FINGERS  . Asthma   . Back pain   . Bipolar disorder ([place])   . Depression    BIPOLAR  . Dyspnea   . Fibromyalgia   . GERD (gastroesophageal reflux disease)   . Headache(784.0)    MIGRAINES  . Hyperlipidemia   . Memory difficulties 06/21/2014  . Neck pain   . OA (osteoarthritis) of knee   . Obesity   . 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  . Seizures ([place])    FACIAL SEIZURES LAST 4 DAYS AGO    PAST SURGICAL HISTORY: Past Surgical  History:  Procedure Laterality Date  . CARDIAC CATHETERIZATION  2010  . DILATION AND CURETTAGE OF UTERUS  1994  . LAPAROSCOPY N/A 11/21/2012   Procedure: LAPAROSCOPY OPERATIVE REMOVAL OF RIGHT TUBE AND OVARY AND FLUID FROM MASS;  Surgeon: Melina Schools, MD;  Location: [facility];  Service: Gynecology;  Laterality: N/A;  1 1/2hrs OR time  . OOPHORECTOMY      FAMILY HISTORY: Family History  Problem Relation Age of Onset  . Cancer - Lung Father   . Diabetes Father   . Cirrhosis Mother   . Migraines Sister   . Stroke Sister   . Healthy Brother   . Healthy Sister   . Healthy Sister   . Healthy Sister   . Healthy Brother   . Healthy Brother   . Healthy Brother   . Healthy Brother   . Healthy Brother   . Heart disease Maternal Grandmother   . Alzheimer's disease Maternal Grandfather   . Cancer - Lung Paternal Grandmother     SOCIAL HISTORY: Social History   Social History  . Marital status: Divorced    Spouse name: N/A  . Number of children: 5  . Years of education: BA   Occupational History  . Not on file.   Social History Main Topics  . Smoking status: Former Smoker    Quit date: 02/16/2003  . Smokeless tobacco: Never Used  . Alcohol use No     Comment: occasionally  . Drug use: No  . Sexual activity: Not Currently   Other Topics Concern  . Not on file   Social History Narrative   Patient drinks 2 cups of caffeine daily.   Patient is right handed.      PHYSICAL EXAM  Vitals:   10/09/15 1516  BP: 105/69  Pulse: 71  Weight: 215 lb 12.8 oz (97.9 kg)  Height: 5\' 8"  (1.727 m)   Body mass index is 32.81 kg/m.  Generalized: Well developed, in no acute distress   Neurological examination  Mentation: Alert oriented to time, place, history taking. Follows all commands speech and language fluent Cranial nerve II-XII: Funduscopic exam reveals sharp disc margins. Pupils were equal round reactive to light. Extraocular movements were full, visual field were full  on confrontational test. Facial sensation and strength were normal. Uvula tongue midline. Head turning and shoulder shrug  were normal and symmetric. Motor: The motor testing reveals 5 over 5 strength of all 4 extremities. Good symmetric motor tone is noted throughout.  Sensory: Sensory testing is intact to soft touch on all 4 extremities-Although reports that it is decreased on the right side of the body.. No evidence of extinction is noted.  Coordination: Cerebellar testing reveals good finger-nose-finger and heel-to-shin bilaterally.  Gait and station: Gait is normal. Tandem gait is normal. Romberg is negative. No drift is seen.  Reflexes: Deep tendon reflexes are symmetric  and normal bilaterally.   DIAGNOSTIC DATA (LABS, IMAGING, TESTING) - I reviewed patient records, labs, notes, testing and imaging myself where available.        ASSESSMENT AND PLAN 45 y.o. year old female  has a past medical history of Anxiety; Arthritis; Asthma; Back pain; Bipolar disorder ([place]); Depression; Dyspnea; Fibromyalgia; GERD (gastroesophageal reflux disease); Headache(784.0); Hyperlipidemia; Memory difficulties (06/21/2014); Neck pain; OA (osteoarthritis) of knee; Obesity; Pseudotumor cerebri syndrome (05/30/2014); and Seizures ([place]). here with:  1. Pseudotumor cerebri 2. Fibromyalgia  The patient will increase Topamax to 75 mg to see if this eliminates the "tingling sensation" in the right side of the face. She will continue on Cymbalta. If this is not beneficial she will let us know. Follow-up in 6 months or sooner if needed.     Ward Givens, MSN, NP-C 10/09/2015, 3:34 PM [facility] [address], [place], [place] [address] [phone]

## 2015-10-09 NOTE — Patient Instructions (Signed)
Continue cymbalta Increase topamax to 75 mg twice a day  If your symptoms worsen or you develop new symptoms please let us know.

## 2015-10-30 ENCOUNTER — Telehealth: Payer: Self-pay | Admitting: Neurology

## 2015-10-30 NOTE — Telephone Encounter (Signed)
Patient called requesting appointment with Dr. Jannifer Franklin to address change in medication topiramate (TOPAMAX) 50 MG tablet at last visit that didn't address pseudo tumor. Patient is currently scheduled with NP, [facility] February 2018. Please call [phone].

## 2015-11-03 NOTE — Telephone Encounter (Signed)
Returned TC. Appt scheduled next Thursday. Pt agreed to arrive 15 min prior to her appt time.

## 2015-11-13 ENCOUNTER — Encounter: Payer: Self-pay | Admitting: Neurology

## 2015-11-13 ENCOUNTER — Ambulatory Visit (INDEPENDENT_AMBULATORY_CARE_PROVIDER_SITE_OTHER): Payer: Medicaid Other | Admitting: Neurology

## 2015-11-13 ENCOUNTER — Telehealth: Payer: Self-pay | Admitting: Neurology

## 2015-11-13 VITALS — BP 113/80 | HR 64 | Ht 68.0 in | Wt 211.0 lb

## 2015-11-13 DIAGNOSIS — M797 Fibromyalgia: Secondary | ICD-10-CM | POA: Diagnosis not present

## 2015-11-13 DIAGNOSIS — M79605 Pain in left leg: Secondary | ICD-10-CM | POA: Diagnosis not present

## 2015-11-13 DIAGNOSIS — R413 Other amnesia: Secondary | ICD-10-CM | POA: Diagnosis not present

## 2015-11-13 DIAGNOSIS — G932 Benign intracranial hypertension: Secondary | ICD-10-CM | POA: Diagnosis not present

## 2015-11-13 DIAGNOSIS — G4733 Obstructive sleep apnea (adult) (pediatric): Secondary | ICD-10-CM

## 2015-11-13 NOTE — Telephone Encounter (Signed)
The original order was placed by Dr. Brett Fairy, I will reorder this study.

## 2015-11-13 NOTE — Progress Notes (Signed)
Reason for visit: Pseudotumor cerebri  Brittney [name] is an 45 y.o. female  History of present illness:  [name] is a 45 year old right-handed black female with a history of fibromyalgia and a history of pseudotumor cerebri. The patient indicates that she really has not had any significant issues with headaches, she mainly is concerned about episodes of tingling involving the right face. The episodes may occur anywhere from 3-6 times a month lasting 45 seconds at a time. The patient has had MRI evaluation of the brain that was normal. The patient has indicated that the Topamax has helped these episodes initially, it is no longer helping even with a recent increase in dosing. The patient has over the last 6 weeks developed a new problem with sharp shooting pains in involving the left leg from the knee down, she does have some chronic low back pain. She has the diagnosis of rheumatoid arthritis as well. The patient feels as if the legs are weak, the episodes of pain will come and go. The patient occasionally will have episodes of bowel incontinence at night, she has some mild changes in gait, but no falls. She returns to this office for an evaluation.  Past Medical History:  Diagnosis Date  . Anxiety   . Arthritis    KNEES ELBOWS HIPS SHOULDER FINGERS  . Asthma   . Back pain   . Bipolar disorder ([place])   . Depression    BIPOLAR  . Dyspnea   . Fibromyalgia   . GERD (gastroesophageal reflux disease)   . Headache(784.0)    MIGRAINES  . Hyperlipidemia   . Memory difficulties 06/21/2014  . Neck pain   . OA (osteoarthritis) of knee   . Obesity   . 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  . Seizures ([place])    FACIAL SEIZURES LAST 4 DAYS AGO    Past Surgical History:  Procedure Laterality Date  . CARDIAC CATHETERIZATION  2010  . DILATION AND CURETTAGE OF UTERUS  1994  . LAPAROSCOPY N/A 11/21/2012   Procedure: LAPAROSCOPY OPERATIVE REMOVAL OF RIGHT TUBE AND OVARY AND FLUID FROM MASS;   Surgeon: Melina Schools, MD;  Location: [facility];  Service: Gynecology;  Laterality: N/A;  1 1/2hrs OR time  . OOPHORECTOMY      Family History  Problem Relation Age of Onset  . Cancer - Lung Father   . Diabetes Father   . Cirrhosis Mother   . Migraines Sister   . Stroke Sister   . Healthy Brother   . Healthy Sister   . Healthy Sister   . Healthy Sister   . Healthy Brother   . Healthy Brother   . Healthy Brother   . Healthy Brother   . Healthy Brother   . Heart disease Maternal Grandmother   . Alzheimer's disease Maternal Grandfather   . Cancer - Lung Paternal Grandmother     Social history:  reports that she quit smoking about 12 years ago. She has never used smokeless tobacco. She reports that she does not drink alcohol or use drugs.    Allergies  Allergen Reactions  . Nsaids     REACTION: edema   Pt says she can take ibuprofen    Medications:  Prior to Admission medications   Medication Sig Start Date End Date Taking? Authorizing Provider  cyclobenzaprine (FLEXERIL) 10 MG tablet Take 1 tablet (10 mg total) by mouth every 6 (six) hours as needed for muscle spasms. 09/10/15  Yes Kathrynn Ducking, MD  Etanercept (ENBREL [place]) Inject into the skin once a week.   Yes Historical Provider, MD  meloxicam (MOBIC) 15 MG tablet Take 1 tablet (15 mg total) by mouth at bedtime. 09/30/15  Yes Meredith Staggers, MD  methotrexate (RHEUMATREX) 2.5 MG tablet Take 12.5 mg by mouth once a week. Takes 5 tablets twice daily only on Saturday. 09/09/14  Yes Historical Provider, MD  pregabalin (LYRICA) 200 MG capsule Take 1 capsule (200 mg total) by mouth 2 (two) times daily. Patient taking differently: Take 200 mg by mouth daily.  04/22/15  Yes Meredith Staggers, MD  topiramate (TOPAMAX) 50 MG tablet Take 1.5 tablets (75 mg total) by mouth 2 (two) times daily. 10/09/15  Yes [place] Givens, NP    ROS:  Out of a complete 14 system review of symptoms, the patient complains only of the following  symptoms, and all other reviewed systems are negative.  Fatigue Eye itching, eye pain Constipation Difficulty urinating, painful urination Joint pain, achy muscles, muscle cramps, neck pain Itching Memory loss, headache, weakness Agitation, decreased concentration, depression  Blood pressure 113/80, pulse 64, height 5\' 8"  (1.727 m), weight 211 lb (95.7 kg).  Physical Exam  General: The patient is alert and cooperative at the time of the examination.  Neuromuscular: Range of movement of the low back is full.  Skin: No significant peripheral edema is noted.   Neurologic Exam  Mental status: The patient is alert and oriented x 3 at the time of the examination. The patient has apparent normal recent and remote memory, with an apparently normal attention span and concentration ability.   Cranial nerves: Facial symmetry is present. Speech is normal, no aphasia or dysarthria is noted. Extraocular movements are full. Visual fields are full. Pupils are equal, round, and reactive to light. Discs are flat bilaterally.  Motor: The patient has good strength in all 4 extremities.  Sensory examination: Soft touch sensation is symmetric on the face, arms, and legs.  Coordination: The patient has good finger-nose-finger and heel-to-shin bilaterally.  Gait and station: The patient has a normal gait. Tandem gait is normal. Romberg is negative. No drift is seen.  Reflexes: Deep tendon reflexes are symmetric.   Assessment/Plan:  1. History of pseudotumor cerebri  2. Episodic right facial numbness and tingling  3. Rheumatoid arthritis  4. Episodic left leg pain  The patient is getting little benefit from the Topamax. She is being followed by an ophthalmologist, Dr. Frederico Hamman. I will taper her off of Topamax by 50 mg every 2 weeks until she is off the medication. The patient will be set up for nerve conduction studies on both legs, EMG on the left leg. She will follow-up for the above study.    Jill Alexanders MD 11/13/2015 12:36 PM  [facility] [address] [place], [place] [address]  Phone [phone] Fax [phone]

## 2015-11-13 NOTE — Telephone Encounter (Signed)
This patient needs new referral for sleep study.  Last one was over a year ago and has been removed from epic.  Thanks, [employer]

## 2015-11-13 NOTE — Patient Instructions (Signed)
   We will get EMG and NCV evaluation of the left leg. Taper off of the Topamax 50 mg tablet, reducing dose by one tablet every 2 weeks until off of the medication.

## 2015-11-20 NOTE — Addendum Note (Signed)
Addended by: Monte Fantasia on: 11/20/2015 03:51 PM   Modules accepted: Orders

## 2015-11-20 NOTE — Addendum Note (Signed)
Addended by: Monte Fantasia on: 11/20/2015 02:22 PM   Modules accepted: Orders

## 2015-11-24 ENCOUNTER — Telehealth: Payer: Self-pay

## 2015-11-24 MED ORDER — CYCLOBENZAPRINE HCL 10 MG PO TABS: 10.0000 mg | ORAL_TABLET | Freq: Four times a day (QID) | ORAL | 2 refills | Status: DC | PRN

## 2015-11-24 NOTE — Telephone Encounter (Signed)
Refills e-scribed per faxed request from pharmacy. 

## 2015-11-26 ENCOUNTER — Ambulatory Visit: Payer: Medicaid Other | Admitting: Registered Nurse

## 2015-12-09 ENCOUNTER — Other Ambulatory Visit: Payer: Self-pay | Admitting: Obstetrics and Gynecology

## 2015-12-17 ENCOUNTER — Encounter: Payer: Medicaid Other | Admitting: Neurology

## 2016-01-12 ENCOUNTER — Emergency Department (HOSPITAL_COMMUNITY)
Admission: EM | Admit: 2016-01-12 | Discharge: 2016-01-12 | Discharge status: Against Medical Advice | Payer: Medicaid Other

## 2016-01-12 NOTE — ED Triage Notes (Signed)
Called for triage no response 

## 2016-01-12 NOTE — ED Triage Notes (Signed)
Pt called multiple times by triage staff without response.

## 2016-01-12 NOTE — ED Notes (Signed)
Called for triage x2 no response.

## 2016-01-28 ENCOUNTER — Ambulatory Visit: Payer: Medicaid Other | Admitting: Physical Medicine & Rehabilitation

## 2016-02-23 ENCOUNTER — Other Ambulatory Visit: Payer: Self-pay | Admitting: Adult Health

## 2016-02-25 ENCOUNTER — Ambulatory Visit: Payer: Medicaid Other | Admitting: Adult Health

## 2016-03-02 NOTE — Telephone Encounter (Signed)
Opened in error

## 2016-03-15 ENCOUNTER — Ambulatory Visit (INDEPENDENT_AMBULATORY_CARE_PROVIDER_SITE_OTHER): Payer: Medicaid Other | Admitting: Neurology

## 2016-03-15 ENCOUNTER — Encounter: Payer: Self-pay | Admitting: Neurology

## 2016-03-15 VITALS — BP 101/71 | HR 77 | Ht 68.0 in | Wt 213.6 lb

## 2016-03-15 DIAGNOSIS — M797 Fibromyalgia: Secondary | ICD-10-CM

## 2016-03-15 DIAGNOSIS — G932 Benign intracranial hypertension: Secondary | ICD-10-CM

## 2016-03-15 DIAGNOSIS — R413 Other amnesia: Secondary | ICD-10-CM

## 2016-03-15 DIAGNOSIS — G43009 Migraine without aura, not intractable, without status migrainosus: Secondary | ICD-10-CM

## 2016-03-15 MED ORDER — CARBAMAZEPINE 200 MG PO TABS: 100.0000 mg | ORAL_TABLET | Freq: Two times a day (BID) | ORAL | 3 refills | Status: DC

## 2016-03-15 NOTE — Patient Instructions (Signed)
   We will start carbamazepine for the left neck pain.

## 2016-03-15 NOTE — Progress Notes (Signed)
Reason for visit: Fibromyalgia  Brittney [name] is an 46 y.o. female  History of present illness:  [name] is a 46 year old right-handed black female with a history of fibromyalgia. The patient also has chronic daily headaches, and bipolar disorder. The patient has been on medications such as Cymbalta in the past but this resulted in increased moodiness and mood instability. The patient has had pain on all 4 extremities, and throughout the body at this time. The patient has noted within the last month to have a shooting pain involving the left neck, putting pressure on this area helps the pain a bit. The patient may have pain that lasts about a minute, and comes on every 2 or 3 days. The patient also has bilateral ear pain and ringing in the ears. She has been on antibiotics for the last 5 days for sinus infection. She denies any throat pain associated with a neck discomfort. No changes in balance, numbness or weakness has been noted.  Past Medical History:  Diagnosis Date  . Anxiety   . Arthritis    KNEES ELBOWS HIPS SHOULDER FINGERS  . Asthma   . Back pain   . Bipolar disorder ([place])   . Depression    BIPOLAR  . Dyspnea   . Fibromyalgia   . GERD (gastroesophageal reflux disease)   . Headache(784.0)    MIGRAINES  . Hyperlipidemia   . Memory difficulties 06/21/2014  . Neck pain   . OA (osteoarthritis) of knee   . Obesity   . 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  . 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 CARDIAC CATHETERIZATION  2010  . DILATION AND CURETTAGE OF UTERUS  1994  . LAPAROSCOPY N/A 11/21/2012   Procedure: LAPAROSCOPY OPERATIVE REMOVAL OF RIGHT TUBE AND OVARY AND FLUID FROM MASS;  Surgeon: Melina Schools, MD;  Location: [facility];  Service: Gynecology;  Laterality: N/A;  1 1/2hrs OR time  . OOPHORECTOMY      Family History  Problem Relation Age of Onset  . Cancer - Lung Father   . Diabetes  Father   . Cirrhosis Mother   . Migraines Sister   . Stroke Sister   . Healthy Brother   . Healthy Sister   . Healthy Sister   . Healthy Sister   . Healthy Brother   . Healthy Brother   . Healthy Brother   . Healthy Brother   . Healthy Brother   . Heart disease Maternal Grandmother   . Alzheimer's disease Maternal Grandfather   . Cancer - Lung Paternal Grandmother     Social history:  reports that she quit smoking about 13 years ago. She has never used smokeless tobacco. She reports that she does not drink alcohol or use drugs.    Allergies  Allergen Reactions  . Nsaids     REACTION: edema   Pt says she can take ibuprofen    Medications:  Prior to Admission medications   Medication Sig Start Date End Date Taking? Authorizing Provider  cyclobenzaprine (FLEXERIL) 10 MG tablet Take 1 tablet (10 mg total) by mouth every 6 (six) hours as needed for muscle spasms. 12/01/15  Yes Kathrynn Ducking, MD  Etanercept (ENBREL [place]) Inject into the skin once a week.   Yes Historical Provider, MD  methotrexate (RHEUMATREX) 2.5 MG tablet Take 12.5 mg by mouth once a week. Takes 5 tablets twice daily  only on Saturday. 09/09/14  Yes Historical Provider, MD  pregabalin (LYRICA) 200 MG capsule Take 1 capsule (200 mg total) by mouth 2 (two) times daily. Patient taking differently: Take 200 mg by mouth daily.  04/22/15  Yes Meredith Staggers, MD  carbamazepine (TEGRETOL) 200 MG tablet Take 0.5 tablets (100 mg total) by mouth 2 (two) times daily. 03/15/16   Kathrynn Ducking, MD    ROS:  Out of a complete 14 system review of symptoms, the patient complains only of the following symptoms, and all other reviewed systems are negative.  Decreased activity, fatigue Ear pain, ringing in the ears Double vision, eye pain Shortness of breath Abdominal pain, constipation Daytime sleepiness Frequent infections Joint pain, back pain, achy muscles, muscle cramps, walking difficulty, neck pain Memory loss,  dizziness, headache, speech problems, weakness, tremors Agitation, confusion, decreased concentration  Blood pressure 101/71, pulse 77, height 5\' 8"  (1.727 m), weight 213 lb 9.6 oz (96.9 kg).  Physical Exam  General: The patient is alert and cooperative at the time of the examination.  Neuromuscular: Range of movement of the cervical spine is full.  Skin: No significant peripheral edema is noted.   Neurologic Exam  Mental status: The patient is alert and oriented x 3 at the time of the examination. The patient has apparent normal recent and remote memory, with an apparently normal attention span and concentration ability.   Cranial nerves: Facial symmetry is present. The patient reports decreased sensation on the left face as compared to the right with pinprick, the patient splits the midline with vibration sensation on the forehead. Speech is normal, no aphasia or dysarthria is noted. Extraocular movements are full. Visual fields are full.  Motor: The patient has good strength in all 4 extremities.  Sensory examination:  Pinprick sensation is decreased on the left arm and left leg as compared to the right, vibration sensation is symmetric on the arms, decreased on the left leg as compared to the right.  Coordination: The patient has good finger-nose-finger and heel-to-shin bilaterally.  Gait and station: The patient has a normal gait. Tandem gait is normal. Romberg is negative. No drift is seen.  Reflexes: Deep tendon reflexes are symmetric.   Assessment/Plan:  1. Fibromyalgia  2. Chronic daily headache  3. Left neck pain, new onset  4. Nonorganic sensory examination  The pain described by the patient appears to be consistent with a neuralgia pain of some sort, with brief duration, sharp pain sensation, and intermittent. The patient will be given a trial on low-dose carbamazepine taking 100 mg twice daily. She will follow-up in 3 or 4 months, we will check blood work at  that time. The dose can be adjusted if needed.  Jill Alexanders MD 03/15/2016 10:17 AM  [facility] [address] [place], [place] [address]  Phone [phone] Fax [phone]

## 2016-03-16 ENCOUNTER — Ambulatory Visit: Payer: Medicaid Other

## 2016-03-22 ENCOUNTER — Ambulatory Visit: Payer: Medicaid Other

## 2016-03-25 ENCOUNTER — Telehealth: Payer: Self-pay | Admitting: *Deleted

## 2016-03-25 NOTE — Telephone Encounter (Signed)
Called and left detailed VM advising patient I called pharmacy because insurance will not cover carbamazepine. Spoke with Claiborne Billings who advised her out of pocket cost is $30.07. Rx ready to be picked up from her pharmacy. Gave [facility] phone number if she has further questions.

## 2016-04-01 DIAGNOSIS — Z0271 Encounter for disability determination: Secondary | ICD-10-CM

## 2016-04-13 ENCOUNTER — Ambulatory Visit: Payer: Medicaid Other | Admitting: Adult Health

## 2016-06-16 ENCOUNTER — Other Ambulatory Visit: Payer: Self-pay | Admitting: Neurology

## 2016-07-01 ENCOUNTER — Ambulatory Visit: Payer: Medicaid Other | Admitting: Adult Health

## 2016-07-20 ENCOUNTER — Other Ambulatory Visit: Payer: Self-pay | Admitting: Physical Medicine & Rehabilitation

## 2016-07-22 ENCOUNTER — Other Ambulatory Visit: Payer: Self-pay | Admitting: Neurology

## 2016-08-02 ENCOUNTER — Ambulatory Visit (INDEPENDENT_AMBULATORY_CARE_PROVIDER_SITE_OTHER): Payer: Medicaid Other | Admitting: Adult Health

## 2016-08-02 ENCOUNTER — Encounter: Payer: Self-pay | Admitting: Adult Health

## 2016-08-02 VITALS — BP 105/72 | HR 73 | Wt 208.8 lb

## 2016-08-02 DIAGNOSIS — R413 Other amnesia: Secondary | ICD-10-CM | POA: Diagnosis not present

## 2016-08-02 DIAGNOSIS — M792 Neuralgia and neuritis, unspecified: Secondary | ICD-10-CM

## 2016-08-02 MED ORDER — CARBAMAZEPINE 200 MG PO TABS: 100.0000 mg | ORAL_TABLET | Freq: Two times a day (BID) | ORAL | 3 refills | Status: DC

## 2016-08-02 NOTE — Patient Instructions (Signed)
Start Carbamazepine 100 mg twice a day Memory score is 29/30- follow-up with PCP If your symptoms worsen or you develop new symptoms please let us know.   Carbamazepine chewable tablets What is this medicine? CARBAMAZEPINE (kar ba MAZ e peen) is used to control seizures caused by certain types of epilepsy. This medicine is also used to treat nerve related pain. It is not for common aches and pains. This medicine may be used for other purposes; ask your health care provider or pharmacist if you have questions. COMMON BRAND NAME(S): Tegretol What should I tell my health care provider before I take this medicine? They need to know if you have any of these conditions: -Asian ancestry -bone marrow disease -glaucoma -heart disease or irregular heartbeat -kidney disease -liver disease -low blood counts, like low white cell, platelet, or red cell counts -porphyria -psychotic disorders -suicidal thoughts, plans, or attempt; a previous suicide attempt by you or a family member -an unusual or allergic reaction to carbamazepine, tricyclic antidepressants, phenytoin, phenobarbital or other medicines, foods, dyes, or preservatives -pregnant or trying to get pregnant -breast-feeding How should I use this medicine? Take this medicine by mouth. Chew it or swallow whole. Follow the directions on the prescription label. Take this medicine with food. Take your doses at regular intervals. Do not take your medicine more often than directed. Do not stop taking this medicine except on the advice of your doctor or health care professional. A special MedGuide will be given to you by the pharmacist with each prescription and refill. Be sure to read this information carefully each time. Talk to your pediatrician regarding the use of this medicine in children. While this drug may be prescribed for children 58 years of age and younger for selected conditions, precautions do apply. Overdosage: If you think you have  taken too much of this medicine contact a poison control center or emergency room at once. NOTE: This medicine is only for you. Do not share this medicine with others. What if I miss a dose? If you miss a dose, take it as soon as you can. If it is almost time for your next dose, take only that dose. Do not take double or extra doses. What may interact with this medicine? Do not take this medicine with any of the following medications: -certain medicines used to treat HIV infection or AIDS that are given in combination with cobicistat - delavirdine - MAOIs like Carbex, Eldepryl, Marplan, Nardil, and Parnate - nefazodone - oxcarbazepine This medicine may also interact with the following medications: - acetaminophen - acetazolamide - barbiturate medicines for inducing sleep or treating seizures, like phenobarbital - certain antibiotics like clarithromycin, erythromycin or troleandomycin - cimetidine - cyclosporine - danazol - dicumarol - doxycycline - female hormones, including estrogens and birth control pills - grapefruit juice - isoniazid, INH - levothyroxine and other thyroid hormones - lithium and other medicines to treat mood problems or psychotic disturbances - loratadine - medicines for angina or high blood pressure - medicines for cancer - medicines for depression or anxiety - medicines for sleep - medicines to treat fungal infections, like fluconazole, itraconazole or ketoconazole - medicines used to treat HIV infection or AIDS - methadone - niacinamide - praziquantel - propoxyphene - rifampin or rifabutin - seizure or epilepsy medicine - steroid medicines such as prednisone or cortisone - theophylline - tramadol - warfarin This list may not describe all possible interactions. Give your health care provider a list of all the medicines, herbs, non-prescription drugs,  or dietary supplements you use. Also tell them if you smoke, drink  alcohol, or use illegal drugs. Some items may interact with your medicine. What should I watch for while using this medicine? Visit your doctor or health care professional for a regular check on your progress. Do not change brands or dosage forms of this medicine without discussing the change with your doctor or health care professional. If you are taking this medicine for epilepsy (seizures) do not stop taking it suddenly. This increases the risk of seizures. Wear a Probation officer or necklace. Carry an identification card with information about your condition, medications, and doctor or health care professional. Dennis Bast may get drowsy, dizzy, or have blurred vision. Do not drive, use machinery, or do anything that needs mental alertness until you know how this medicine affects you. To reduce dizzy or fainting spells, do not sit or stand up quickly, especially if you are an older patient. Alcohol can increase drowsiness and dizziness. Avoid alcoholic drinks. Birth control pills may not work properly while you are taking this medicine. Talk to your doctor about using an extra method of birth control. This medicine can make you more sensitive to the sun. Keep out of the sun. If you cannot avoid being in the sun, wear protective clothing and use sunscreen. Do not use sun lamps or tanning beds/booths. The use of this medicine may increase the chance of suicidal thoughts or actions. Pay special attention to how you are responding while on this medicine. Any worsening of mood, or thoughts of suicide or dying should be reported to your health care professional right away. Women who become pregnant while using this medicine may enroll in the [place] Pregnancy Registry by calling [phone]. This registry collects information about the safety of antiepileptic drug use during pregnancy. What side effects may I notice from receiving this medicine? Side effects that you should report to  your doctor or health care professional as soon as possible: -allergic reactions like skin rash, itching or hives, swelling of the face, lips, or tongue -breathing problems -change in vision -confusion -dark urine -fast or irregular heartbeat -fever or chills, sore throat -mouth ulcers -pain or difficulty passing urine -redness, blistering, peeling or loosening of the skin, including inside the mouth -ringing in the ears -seizures -stomach pain -swollen joints or muscle/joint aches and pains -unusual bleeding or bruising -unusually weak or tired -vomiting -worsening of mood, thoughts or actions of suicide or dying -yellowing of the eyes or skin Side effects that usually do not require medical attention (report to your doctor or health care professional if they continue or are bothersome): -clumsiness or unsteadiness -diarrhea or constipation -headache -increased sweating -nausea This list may not describe all possible side effects. Call your doctor for medical advice about side effects. You may report side effects to FDA at 1-800-FDA-1088. Where should I keep my medicine? Keep out of reach of children. Store at room temperature below 30 degrees C (86 degrees F). Keep container tightly closed. Protect from moisture. Throw away any unused medicine after the expiration date. NOTE: This sheet is a summary. It may not cover all possible information. If you have questions about this medicine, talk to your doctor, pharmacist, or health care provider.  2018 Elsevier/Gold Standard (2014-01-24 15:31:54)

## 2016-08-02 NOTE — Progress Notes (Signed)
PATIENT: Brittney [name] DOB: 06-22-1970  REASON FOR VISIT: follow up-fibromyalgia, chronic daily headaches HISTORY FROM: patient  HISTORY OF PRESENT ILLNESS: [name] is a 46 year old female with a history of fibromyalgia and chronic daily headaches. She returns today for follow-up. At the last visit she was started on carbamazepine 100 mg twice a day. She reports that she never started this. She seems unaware that it was even prescribed. The patient states that she is not having headaches. She is having twitching that occurs in the right side of the face- in the bottom half.She states that she will have a pins and needle sensation on the right side of the entire face. She states that when these episodes started it can last 45-90 seconds and during this time she feels as if she is frozen and unable to speak. She denies any significant pain just a pins and needles sensations on that side of the face. She reports last week she began having shooting pain in the eyes. She did go see her ophthalmologist and her eye exam was normal. She was advised to use eyedrops. I have not seen this report- this is all verbalized by the patient. She also reports trouble with her memory. She states that she has a hard time remembering what her next step would be. She states that she was making breakfast and then forgot what she should do next. She is also occurring  with driving and she will forget if she needs to take a right or left turn. She is able to complete all ADLs independently. She denies any new medication. She returns today for an evaluation.   HISTORY 03/15/16:  [name] is a 46 year old right-handed black female with a history of fibromyalgia. The patient also has chronic daily headaches, and bipolar disorder. The patient has been on medications such as Cymbalta in the past but this resulted in increased moodiness and mood instability. The patient has had pain on all 4 extremities, and throughout  the body at this time. The patient has noted within the last month to have a shooting pain involving the left neck, putting pressure on this area helps the pain a bit. The patient may have pain that lasts about a minute, and comes on every 2 or 3 days. The patient also has bilateral ear pain and ringing in the ears. She has been on antibiotics for the last 5 days for sinus infection. She denies any throat pain associated with a neck discomfort. No changes in balance, numbness or weakness has been noted.  REVIEW OF SYSTEMS: Out of a complete 14 system review of symptoms, the patient complains only of the following symptoms, and all other reviewed systems are negative.  Fatigue, excessive sweating, ear pain, ringing in ears, eye itching, loss of vision, eye pain, blurred vision, shortness of breath, abdominal pain, constipation, diarrhea, nausea, excessive thirst, insomnia, frequent waking, daytime sleepiness, frequent infections, difficulty urinating, painful urination, joint pain, joint swelling, back pain, aching muscles, muscle cramps, walking difficulty, neck pain, neck stiffness, wounds, itching, bruise/bleed easily, memory loss, dizziness, headache, numbness, weakness, agitation, behavior problem, confusion, decreased concentration, depression, nervous/anxious, hyperactive, self injury  ALLERGIES: Allergies  Allergen Reactions  . Nsaids     REACTION: edema   Pt says she can take ibuprofen    HOME MEDICATIONS: Outpatient Medications Prior to Visit  Medication Sig Dispense Refill  . carbamazepine (TEGRETOL) 200 MG tablet Take 0.5 tablets (100 mg total) by mouth 2 (two) times daily. [place]  tablet 3  . cyclobenzaprine (FLEXERIL) 10 MG tablet Take 1 tablet (10 mg total) by mouth every 6 (six) hours as needed for muscle spasms. 40 tablet 3  . Etanercept (ENBREL [place]) Inject into the skin once a week.    Marland Kitchen LYRICA 200 MG capsule take 1 capsule BY MOUTH TWICE DAILY 60 capsule 0  . methotrexate  (RHEUMATREX) 2.5 MG tablet Take 12.5 mg by mouth once a week. Takes 5 tablets twice daily only on Saturday.     No facility-administered medications prior to visit.     PAST MEDICAL HISTORY: Past Medical History:  Diagnosis Date  . Anxiety   . Arthritis    KNEES ELBOWS HIPS SHOULDER FINGERS  . Asthma   . Back pain   . Bipolar disorder ([place])   . Depression    BIPOLAR  . Dyspnea   . Fibromyalgia   . GERD (gastroesophageal reflux disease)   . Headache(784.0)    MIGRAINES  . Hyperlipidemia   . Memory difficulties 06/21/2014  . Neck pain   . OA (osteoarthritis) of knee   . Obesity   . 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  . Seizures ([place])    FACIAL SEIZURES LAST 4 DAYS AGO    PAST SURGICAL HISTORY: Past Surgical History:  Procedure Laterality Date  . CARDIAC CATHETERIZATION  2010  . DILATION AND CURETTAGE OF UTERUS  1994  . LAPAROSCOPY N/A 11/21/2012   Procedure: LAPAROSCOPY OPERATIVE REMOVAL OF RIGHT TUBE AND OVARY AND FLUID FROM MASS;  Surgeon: Melina Schools, MD;  Location: [facility];  Service: Gynecology;  Laterality: N/A;  1 1/2hrs OR time  . OOPHORECTOMY      FAMILY HISTORY: Family History  Problem Relation Age of Onset  . Cancer - Lung Father   . Diabetes Father   . Cirrhosis Mother   . Migraines Sister   . Stroke Sister   . Healthy Brother   . Healthy Sister   . Healthy Sister   . Healthy Sister   . Healthy Brother   . Healthy Brother   . Healthy Brother   . Healthy Brother   . Healthy Brother   . Heart disease Maternal Grandmother   . Alzheimer's disease Maternal Grandfather   . Cancer - Lung Paternal Grandmother     SOCIAL HISTORY: Social History   Social History  . Marital status: Divorced    Spouse name: N/A  . Number of children: 5  . Years of education: BA   Occupational History  . Not on file.   Social History Main Topics  . Smoking status: Former Smoker    Quit date: 02/16/2003  . Smokeless tobacco: Never Used  . Alcohol use No      Comment: occasionally  . Drug use: No  . Sexual activity: Not Currently   Other Topics Concern  . Not on file   Social History Narrative   Patient drinks 2 cups of caffeine daily.   Patient is right handed.      PHYSICAL EXAM  Vitals:   08/02/16 1027  BP: 105/72  Pulse: 73  Weight: 208 lb 12.8 oz (94.7 kg)   Body mass index is 31.75 kg/m.  Generalized: Well developed, in no acute distress   Neurological examination  Mentation: Alert oriented to time, place, history taking. Follows all commands speech and language fluent Cranial nerve II-XII: Pupils were equal round reactive to light. Extraocular movements were full, visual field were full on confrontational test. Facial sensation and strength were normal.  Uvula tongue midline. Head turning and shoulder shrug  were normal and symmetric. Motor: The motor testing reveals 5 over 5 strength of all 4 extremities. Good symmetric motor tone is noted throughout.  Sensory: Sensory testing is intact to soft touch on all 4 extremities. No evidence of extinction is noted.  Coordination: Cerebellar testing reveals good finger-nose-finger and heel-to-shin bilaterally.  Gait and station: Gait is normal. Tandem gait is slightly unsteady.. Romberg is negative. No drift is seen.  Reflexes: Deep tendon reflexes are symmetric and normal bilaterally.   DIAGNOSTIC DATA (LABS, IMAGING, TESTING) - I reviewed patient records, labs, notes, testing and imaging myself where available.  Lab Results  Component Value Date   WBC 3.9 (L) 12/29/2013   HGB 12.9 12/18/2014   HCT 38.0 12/18/2014   MCV 81.9 12/29/2013   PLT 116 (L) 12/29/2013      Component Value Date/Time   NA 143 12/18/2014 1120   K 4.1 12/18/2014 1120   CL 107 12/18/2014 1120   CO2 23 12/29/2013 2125   GLUCOSE 92 12/18/2014 1120   BUN 11 12/18/2014 1120   CREATININE 0.80 12/18/2014 1120   CALCIUM 9.3 12/29/2013 2125   PROT 7.9 12/12/2013 1940   ALBUMIN 3.9 12/12/2013 1940    AST 22 12/12/2013 1940   ALT 20 12/12/2013 1940   ALKPHOS 102 12/12/2013 1940   BILITOT <0.2 (L) 12/12/2013 1940   GFRNONAA 72 (L) 12/29/2013 2125   GFRAA 83 (L) 12/29/2013 2125    No results found for: HGBA1C Lab Results  Component Value Date   VITAMINB12 1,386 (H) 06/21/2014   Lab Results  Component Value Date   TSH 0.362 (L) 06/21/2014      ASSESSMENT AND PLAN 46 y.o. year old female  has a past medical history of Anxiety; Arthritis; Asthma; Back pain; Bipolar disorder ([place]); Depression; Dyspnea; Fibromyalgia; GERD (gastroesophageal reflux disease); Headache(784.0); Hyperlipidemia; Memory difficulties (06/21/2014); Neck pain; OA (osteoarthritis) of knee; Obesity; Pseudotumor cerebri syndrome (05/30/2014); and Seizures ([place]). here with :  1. Neuralgia on the right side of face 2. Memory disturbance  I will send another prescription for carbamazepine. She will take 100 mg twice a day. I reviewed side effects of the medication with the patient and given her a handout. Her memory score was 29 out of 30. The patient does have depression and fibromyalgia-- perhaps this is affecting her memory. Patient is encouraged to follow-up with her primary care provider. She is advised that if her symptoms worsen or she develops new symptoms she should let us know. She will follow-up in 6 months or sooner if needed.       Ward Givens, MSN, NP-C 08/02/2016, 10:22 AM [facility] [address] [place], [place] [address] [phone]

## 2016-08-02 NOTE — Progress Notes (Signed)
I have read the note, and I agree with the clinical assessment and plan.  WILLIS,CHARLES KEITH   

## 2016-08-05 ENCOUNTER — Telehealth: Payer: Self-pay | Admitting: *Deleted

## 2016-08-05 NOTE — Telephone Encounter (Signed)
Spoke to pt and asked about needing PA for this drug.  She stated she picked up this and is taking, form [facility].  I asked about needing PA.  ? Cost difference, can try and see.  Spoke to [place].  No at preferred drug.  Is being used for neuralgia.  Sent for clinical review, check tomorrow 24 hours to see if approved.  [place] [id number], IR # [id number].  I did check goodrx and $34.52 for 60 tabs [facility].

## 2016-08-09 MED ORDER — TEGRETOL 200 MG PO TABS: 100.0000 mg | ORAL_TABLET | Freq: Two times a day (BID) | ORAL | 6 refills | Status: DC

## 2016-08-09 NOTE — Telephone Encounter (Signed)
yes

## 2016-08-09 NOTE — Telephone Encounter (Signed)
Medicaid will cover to Brand Name.  Ok to change? On the carbamazepine?

## 2016-08-09 NOTE — Telephone Encounter (Signed)
Order printed and given to [facility]

## 2016-08-10 NOTE — Telephone Encounter (Signed)
Order fax with confirmation [place] [phone].

## 2016-08-17 ENCOUNTER — Other Ambulatory Visit: Payer: Self-pay | Admitting: Physician Assistant

## 2016-08-17 DIAGNOSIS — N644 Mastodynia: Secondary | ICD-10-CM

## 2016-08-24 ENCOUNTER — Other Ambulatory Visit: Payer: Self-pay | Admitting: Physician Assistant

## 2016-08-24 DIAGNOSIS — N644 Mastodynia: Secondary | ICD-10-CM

## 2016-08-29 ENCOUNTER — Emergency Department (HOSPITAL_COMMUNITY)
Admission: EM | Admit: 2016-08-29 | Discharge: 2016-08-29 | Disposition: A | Discharge status: Home / Self Care | Payer: Medicaid Other | Attending: Emergency Medicine | Admitting: Emergency Medicine

## 2016-08-29 ENCOUNTER — Encounter (HOSPITAL_COMMUNITY): Payer: Self-pay | Admitting: *Deleted

## 2016-08-29 DIAGNOSIS — Z79899 Other long term (current) drug therapy: Secondary | ICD-10-CM | POA: Insufficient documentation

## 2016-08-29 DIAGNOSIS — L509 Urticaria, unspecified: Secondary | ICD-10-CM | POA: Diagnosis present

## 2016-08-29 DIAGNOSIS — J45909 Unspecified asthma, uncomplicated: Secondary | ICD-10-CM | POA: Diagnosis not present

## 2016-08-29 DIAGNOSIS — L299 Pruritus, unspecified: Secondary | ICD-10-CM | POA: Insufficient documentation

## 2016-08-29 DIAGNOSIS — Z87891 Personal history of nicotine dependence: Secondary | ICD-10-CM | POA: Diagnosis not present

## 2016-08-29 DIAGNOSIS — T7840XA Allergy, unspecified, initial encounter: Secondary | ICD-10-CM

## 2016-08-29 MED ORDER — FAMOTIDINE IN NACL 20-0.9 MG/50ML-% IV SOLN
20.0000 mg | Freq: Once | INTRAVENOUS | Status: AC
  Administered 2016-08-29: 20 mg via INTRAVENOUS
  Filled 2016-08-29: qty 50

## 2016-08-29 MED ORDER — METHYLPREDNISOLONE SODIUM SUCC 125 MG IJ SOLR
125.0000 mg | Freq: Once | INTRAMUSCULAR | Status: AC
  Administered 2016-08-29: 125 mg via INTRAVENOUS
  Filled 2016-08-29: qty 2

## 2016-08-29 MED ORDER — TRIAMCINOLONE ACETONIDE 0.1 % EX CREA: 1.0000 "application " | TOPICAL_CREAM | Freq: Two times a day (BID) | CUTANEOUS | 0 refills | Status: DC

## 2016-08-29 MED ORDER — PREDNISONE 20 MG PO TABS: 40.0000 mg | ORAL_TABLET | Freq: Every day | ORAL | 0 refills | Status: DC

## 2016-08-29 NOTE — ED Provider Notes (Signed)
[facility] DEPT Provider Note   CSN: [id number] Arrival date & time: 08/29/16  1450     History   Chief Complaint Chief Complaint  Patient presents with  . Allergic Reaction    HPI Brittney [name] is a 46 y.o. female.  HPI 46 year old female past medical history significant for pseudotumor cerebri, seizures, fibromyalgia presents to the ED today with complaints of possible allergic reaction. Patient states this woke up this morning around 10:00 and noticed hives to the back of her neck and her chest these were severely pruritic in nature. Unknown cause for the reaction. Denies any recent new foods. States that they did change her medication including Wellbutrin and Tegretol 2 weeks ago. Patient reports similar episode of symptoms "many years ago for unknown medication". Patient denies any difficulties breathing, sensation of throat closing, shortness of breath. Reports pruritus. Did not returning home for her symptoms prior to EMS arrival. Nothing makes better or worse. Denies any recent new foods or travel. Denies any dizziness, lightheadedness, emesis. Past Medical History:  Diagnosis Date  . Anxiety   . Arthritis    KNEES ELBOWS HIPS SHOULDER FINGERS  . Asthma   . Back pain   . Bipolar disorder ([place])   . Depression    BIPOLAR  . Dyspnea   . Fibromyalgia   . GERD (gastroesophageal reflux disease)   . Headache(784.0)    MIGRAINES  . Hyperlipidemia   . Memory difficulties 06/21/2014  . Neck pain   . OA (osteoarthritis) of knee   . Obesity   . 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  . Seizures ([place])    FACIAL SEIZURES LAST 4 DAYS AGO    Patient Active Problem List   Diagnosis Date Noted  . 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  . Fibromyalgia 02/03/2015  . Tendinitis of both rotator cuffs 02/03/2015  . 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 Insomnia secondary to chronic pain 11/13/2014  . Fatigue due to sleep pattern  disturbance 11/13/2014  . Memory difficulties 06/21/2014  . 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  . PTSD (post-traumatic stress disorder) 12/13/2013  . Bipolar depression ([place]) 12/12/2013  . PERICARDIAL EFFUSION 12/23/2008  . PSEUDOTUMOR CEREBRI 12/05/2008  . UNSPECIFIED PLEURAL EFFUSION 12/05/2008  . DYSPNEA 12/05/2008  . OBESITY 11/19/2008  . Migraine 11/19/2008  . ASTHMA 11/19/2008  . OTHER SPECIFIED DISORDER OF STOMACH AND DUODENUM 03/11/2008    Past Surgical History:  Procedure Laterality Date  . CARDIAC CATHETERIZATION  2010  . DILATION AND CURETTAGE OF UTERUS  1994  . LAPAROSCOPY N/A 11/21/2012   Procedure: LAPAROSCOPY OPERATIVE REMOVAL OF RIGHT TUBE AND OVARY AND FLUID FROM MASS;  Surgeon: Melina Schools, MD;  Location: [facility];  Service: Gynecology;  Laterality: N/A;  1 1/2hrs OR time  . OOPHORECTOMY      OB History    No data available       Home Medications    Prior to Admission medications   Medication Sig Start Date End Date Taking? Authorizing Provider  buPROPion (WELLBUTRIN) 100 MG tablet 1 tablet twice a day Orally 30 day(s) 08/02/16  Yes [provider]  cyclobenzaprine (FLEXERIL) 10 MG tablet Take 1 tablet (10 mg total) by mouth every 6 (six) hours as needed for muscle spasms. 06/16/16  Yes Kathrynn Ducking, MD  dicyclomine (BENTYL) 20 MG tablet Take 20 mg by mouth daily as needed for spasms.  06/17/16  Yes [provider]  fluticasone (FLONASE) 50 MCG/ACT nasal spray Instill  1-2 sprays in each nostril daiy PRN FOR ALLERGIES 05/06/16  Yes [provider]  folic acid (FOLVITE) 1 MG tablet Take 1 mg by mouth daily.    Yes [provider]  LINZESS 145 MCG CAPS capsule Take 1 capsule by mouth daily with first meal for 30 days 07/13/16  Yes [provider]  LYRICA 200 MG capsule take 1 capsule BY MOUTH TWICE DAILY 07/20/16  Yes Meredith Staggers, MD  meloxicam (MOBIC) 15 MG tablet take 1 TABLET BY MOUTH EVERY DAY AS NEEDED  FOR SPASMS with food 07/13/16  Yes [provider]  omeprazole (PRILOSEC) 40 MG capsule 1 capsule. 07/03/16  Yes [provider]  TEGRETOL 200 MG tablet Take 0.5 tablets (100 mg total) by mouth 2 (two) times daily. 08/09/16  Yes Ward Givens, NP  albuterol (PROAIR HFA) 108 (90 Base) MCG/ACT inhaler ProAir HFA 90 mcg/actuation aerosol inhaler    [provider]  Etanercept (ENBREL [place]) Inject into the skin once a week.    [provider]  methotrexate (RHEUMATREX) 2.5 MG tablet Take 25 mg by mouth once a week. Takes 5 tablets twice daily only on Saturday. 09/09/14   [provider]  predniSONE (DELTASONE) 20 MG tablet Take 2 tablets (40 mg total) by mouth daily with breakfast. 08/29/16   Ocie Cornfield T, PA-C  triamcinolone cream (KENALOG) 0.1 % Apply 1 application topically 2 (two) times daily. Apply to the affected area. 08/29/16   Doristine Devoid, PA-C    Family History Family History  Problem Relation Age of Onset  . Cancer - Lung Father   . Diabetes Father   . Cirrhosis Mother   . Migraines Sister   . Stroke Sister   . Healthy Brother   . Healthy Sister   . Healthy Sister   . Healthy Sister   . Healthy Brother   . Healthy Brother   . Healthy Brother   . Healthy Brother   . Healthy Brother   . Heart disease Maternal Grandmother   . Alzheimer's disease Maternal Grandfather   . Cancer - Lung Paternal Grandmother     Social History Social History  Substance Use Topics  . Smoking status: Former Smoker    Quit date: 02/16/2003  . Smokeless tobacco: Never Used  . Alcohol use No     Comment: occasionally     Allergies   Nsaids   Review of Systems Review of Systems  Constitutional: Negative for chills and fever.  HENT: Negative for congestion.   Eyes: Negative for visual disturbance.  Respiratory: Negative for cough, shortness of breath, wheezing and stridor.   Cardiovascular: Negative for chest pain.  Gastrointestinal:  Negative for abdominal pain, diarrhea, nausea and vomiting.  Genitourinary: Negative for dysuria, flank pain, frequency, hematuria, urgency, vaginal bleeding and vaginal discharge.  Musculoskeletal: Negative for arthralgias and myalgias.  Skin: Positive for rash.  Neurological: Negative for dizziness, syncope, weakness, light-headedness, numbness and headaches.  Psychiatric/Behavioral: Negative for sleep disturbance. The patient is not nervous/anxious.      Physical Exam Updated Vital Signs BP 109/77 (BP Location: Left Arm)   Pulse 65   Temp 98.1 F (36.7 C) (Oral)   Resp 16   Ht 5\' 8"  (1.727 m)   Wt 93 kg (205 lb)   SpO2 99%   BMI 31.17 kg/m   Physical Exam  Constitutional: She is oriented to person, place, and time. She appears well-developed and well-nourished.  Non-toxic appearance. No distress.  HENT:  Head:  Normocephalic and atraumatic.  Nose: Nose normal.  Mouth/Throat: Oropharynx is clear and moist.  Patient managing secretions. Maintaining airway and speaking complete sentences. Oropharynx is clear. No lesions of the vehicle because of her lips.  Eyes: Pupils are equal, round, and reactive to light. Conjunctivae and EOM are normal. Right eye exhibits no discharge. Left eye exhibits no discharge.  Neck: Normal range of motion. Neck supple.  Cardiovascular: Normal rate, regular rhythm, normal heart sounds and intact distal pulses.  Exam reveals no gallop and no friction rub.   No murmur heard. Pulmonary/Chest: Effort normal and breath sounds normal. No respiratory distress. She has no wheezes. She has no rales. She exhibits no tenderness.  No stridor   Abdominal: Soft. Bowel sounds are normal. There is no tenderness. There is no rebound and no guarding.  Musculoskeletal: Normal range of motion. She exhibits no tenderness.  Lymphadenopathy:    She has no cervical adenopathy.  Neurological: She is alert and oriented to person, place, and time.  Skin: Skin is warm and  dry. Capillary refill takes less than 2 seconds. Rash noted.  Patient with small hives noted to the back of her neck and chest. Pleuritic in nature. No other rash noted on the body.  Psychiatric: Her behavior is normal. Judgment and thought content normal.  Nursing note and vitals reviewed.    ED Treatments / Results  Labs (all labs ordered are listed, but only abnormal results are displayed) Labs Reviewed - No data to display  EKG  EKG Interpretation None       Radiology No results found.  Procedures Procedures (including critical care time)  Medications Ordered in ED Medications  methylPREDNISolone sodium succinate (SOLU-MEDROL) 125 mg/2 mL injection 125 mg (125 mg Intravenous Given 08/29/16 1615)  famotidine (PEPCID) IVPB 20 mg premix (0 mg Intravenous Stopped 08/29/16 1743)     Initial Impression / Assessment and Plan / ED Course  I have reviewed the triage vital signs and the nursing notes.  Pertinent labs & imaging results that were available during my care of the patient were reviewed by me and considered in my medical decision making (see chart for details).     Patient presents to the ED with hives the back of her neck and chest. Pleuritic in nature. No oral mucosal involvement. No fevers. Patient did recently start Tegretol 2 weeks ago. No other new medication changes.  Doubt SJS but needs close follow-up with PCP. Have encouraged her to discontinue use until she states that her neurologist and primary care doctor tomorrow. Do not feel patient had anaphylactic reaction. Patient re-evaluated prior to dc, is hemodynamically stable, in no respiratory distress, and denies the feeling of throat closing. Pt has been advised to take OTC benadryl & return to the ED if they have a mod-severe allergic rxn (s/s including throat closing, difficulty breathing, swelling of lips face or tongue). Pt is to follow up with their PCP. Pt is agreeable with plan & verbalizes  understanding.   Final Clinical Impressions(s) / ED Diagnoses   Final diagnoses:  Allergic reaction, initial encounter    New Prescriptions New Prescriptions   PREDNISONE (DELTASONE) 20 MG TABLET    Take 2 tablets (40 mg total) by mouth daily with breakfast.   TRIAMCINOLONE CREAM (KENALOG) 0.1 %    Apply 1 application topically 2 (two) times daily. Apply to the affected area.     Doristine Devoid, PA-C 08/29/16 1812    Fredia Sorrow, MD 08/30/16 [phone]

## 2016-08-29 NOTE — ED Triage Notes (Signed)
EMS states hives started around 1000, not sure what the underlying cause of reaction. Benadryl 50mg  IM given. VS 134/81-78-99% RA

## 2016-08-29 NOTE — Discharge Instructions (Signed)
You have been treated for an allergic reaction in the emergency department. Unsure the cause of the reaction. Giving her recent medication of Tegretol please follow-up with your primary care doctor and neurologist at her appointment tomorrow. Would stop taking this medication until follow-up. Take the prednisone for 3 days starting tomorrow. Use the triamcinolone cream as needed. May take over-the-counter Pepcid or Zantac. Continue taking Benadryl. If he develops any worsening rash, fever, rash and your mouth or lips return to the ER.

## 2016-08-30 ENCOUNTER — Telehealth: Payer: Self-pay | Admitting: Adult Health

## 2016-08-30 ENCOUNTER — Ambulatory Visit
Admission: RE | Admit: 2016-08-30 | Discharge: 2016-08-30 | Disposition: A | Discharge status: Home / Self Care | Payer: Medicaid Other | Source: Ambulatory Visit | Attending: Physician Assistant | Admitting: Physician Assistant

## 2016-08-30 ENCOUNTER — Ambulatory Visit: Payer: Medicaid Other

## 2016-08-30 DIAGNOSIS — N644 Mastodynia: Secondary | ICD-10-CM

## 2016-08-30 MED ORDER — PREGABALIN 200 MG PO CAPS: 200.0000 mg | ORAL_CAPSULE | Freq: Two times a day (BID) | ORAL | 3 refills | Status: DC

## 2016-08-30 NOTE — Addendum Note (Signed)
Addended by: Kathrynn Ducking on: 08/30/2016 01:00 PM   Modules accepted: Orders

## 2016-08-30 NOTE — Telephone Encounter (Signed)
Spoke with patient who stated she started taking Tegretol in June, did not fill Rx prior to June due to high cost. She stated that she has also recently started Wellbutrin and Mobic. However she stated ED dr told her the reaction was most likely from tegretol. Per Edman Circle, NP advised patient she may stop taking Tegretol and let rash, hives resolve.  Patient then asked about getting a refill on Lyrica. She stated Dr Jannifer Franklin originally Rxed med, and that she no longer sees Dr Alger Simons, pain doctor who last refilled Lyrica.  This RN stated she would route Lyrica Rx request to Dr Jannifer Franklin' RN. Advised she call back when side effects resolve. Patient verbalized understanding, appreciation of call.

## 2016-08-30 NOTE — Telephone Encounter (Signed)
Patient was seen in the ED yesterday for hives. Hospital thought the hives were from taking TEGRETOL 200 MG tablet. Please call and discuss.

## 2016-08-30 NOTE — Telephone Encounter (Signed)
Dr Jannifer Franklin- please advise. I looked back in hx. I do not see where you have prescribed lyrica. Looks like Meredith Staggers, MD has prescribed in the past?

## 2016-08-30 NOTE — Telephone Encounter (Signed)
I called patient. If she just recently started the carbamazepine, this is most likely culprit for the skin rash, she will come off of carbamazepine, continue Lyrica, a prescription was sent in for Lyrica.  The patient originally was given a prescription for carbamazepine in January 2018, apparently never started the medication.

## 2016-09-09 ENCOUNTER — Encounter: Payer: Medicaid Other | Attending: Family Medicine | Admitting: Registered"

## 2016-09-09 DIAGNOSIS — R635 Abnormal weight gain: Secondary | ICD-10-CM | POA: Insufficient documentation

## 2016-09-09 DIAGNOSIS — Z713 Dietary counseling and surveillance: Secondary | ICD-10-CM | POA: Insufficient documentation

## 2016-09-09 NOTE — Patient Instructions (Addendum)
Vegan for Her by Clint Bolder, MPH, RD  Keep a food & symptom diary.  FODMAP modified elimination food Check app for safe fruits Avoid onions & garlic Check app for safe serving sizes of bean Check the FODMAP app against the Low Sodium handout Consider Gluten free diet to avoid the starch found in wheat Consider avoiding lactose, Dennard Nip is a good brand Consider trying variety of tomatoes that are more yellow.
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
## 2016-09-10 ENCOUNTER — Ambulatory Visit (INDEPENDENT_AMBULATORY_CARE_PROVIDER_SITE_OTHER): Payer: Medicaid Other | Admitting: Neurology

## 2016-09-10 ENCOUNTER — Encounter: Payer: Self-pay | Admitting: Neurology

## 2016-09-10 ENCOUNTER — Telehealth: Payer: Self-pay | Admitting: *Deleted

## 2016-09-10 VITALS — BP 116/76 | HR 61 | Ht 68.0 in | Wt 202.5 lb

## 2016-09-10 DIAGNOSIS — G932 Benign intracranial hypertension: Secondary | ICD-10-CM | POA: Diagnosis not present

## 2016-09-10 DIAGNOSIS — M797 Fibromyalgia: Secondary | ICD-10-CM | POA: Diagnosis not present

## 2016-09-10 DIAGNOSIS — G43009 Migraine without aura, not intractable, without status migrainosus: Secondary | ICD-10-CM

## 2016-09-10 NOTE — Telephone Encounter (Signed)
Called pt. She was on wait list. She is agreeable to come today at 12pm, check in 1130am to see CW,MD

## 2016-09-10 NOTE — Patient Instructions (Signed)
   We will get a spinal tap, if the pressure is high, we will treat for pseudotumor cerebri.

## 2016-09-10 NOTE — Progress Notes (Signed)
Reason for visit: Headaches, fibromyalgia  Brittney [name] is an 46 y.o. female  History of present illness:  Brittney [name] is a 46 year old right-handed black female with a history of pseudotumor cerebri, fibromyalgia, and bipolar disorder. The patient recently was placed on carbamazepine but she developed a rash and itching on the medication and she has come off the drug. The patient has the diagnosis of pseudotumor cerebri, but this diagnosis was made while she was on lithium. The patient is not on lithium currently. She takes Lyrica 200 mg twice daily, but she can only tolerate 1 capsule at night. The patient has chronic nausea, irritable bowel syndrome, she is followed through gastroenterology. She continues to have daily headaches in the frontal and temporal areas, she has neck stiffness with this. The patient reports chronic problems with insomnia. She wakes up several times at night. She returns to this office for an evaluation.  Past Medical History:  Diagnosis Date  . Anxiety   . Arthritis    KNEES ELBOWS HIPS SHOULDER FINGERS  . Asthma   . Back pain   . Bipolar disorder ([place])   . Depression    BIPOLAR  . Dyspnea   . Fibromyalgia   . GERD (gastroesophageal reflux disease)   . Headache(784.0)    MIGRAINES  . Hyperlipidemia   . Memory difficulties 06/21/2014  . Neck pain   . OA (osteoarthritis) of knee   . Obesity   . 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  . Seizures ([place])    FACIAL SEIZURES LAST 4 DAYS AGO    Past Surgical History:  Procedure Laterality Date  . CARDIAC CATHETERIZATION  2010  . DILATION AND CURETTAGE OF UTERUS  1994  . LAPAROSCOPY N/A 11/21/2012   Procedure: LAPAROSCOPY OPERATIVE REMOVAL OF RIGHT TUBE AND OVARY AND FLUID FROM MASS;  Surgeon: Melina Schools, MD;  Location: [facility];  Service: Gynecology;  Laterality: N/A;  1 1/2hrs OR time  . OOPHORECTOMY      Family History  Problem Relation Age of Onset  . Cancer - Lung Father   . Diabetes  Father   . Cirrhosis Mother   . Migraines Sister   . Stroke Sister   . Healthy Brother   . Healthy Sister   . Healthy Sister   . Healthy Sister   . Healthy Brother   . Healthy Brother   . Healthy Brother   . Healthy Brother   . Healthy Brother   . Heart disease Maternal Grandmother   . Alzheimer's disease Maternal Grandfather   . Cancer - Lung Paternal Grandmother   . Breast cancer Neg Hx     Social history:  reports that she quit smoking about 13 years ago. She has never used smokeless tobacco. She reports that she does not drink alcohol or use drugs.    Allergies  Allergen Reactions  . Nsaids     REACTION: edema   Pt says she can take ibuprofen  . Carbamazepine Rash    Medications:  Prior to Admission medications   Medication Sig Start Date End Date Taking? Authorizing Provider  albuterol (PROAIR HFA) 108 (90 Base) MCG/ACT inhaler ProAir HFA 90 mcg/actuation aerosol inhaler   Yes [provider]  buPROPion (WELLBUTRIN) 100 MG tablet 1 tablet twice a day Orally 30 day(s) 08/02/16  Yes [provider]  cyclobenzaprine (FLEXERIL) 10 MG tablet Take 1 tablet (10 mg total) by mouth every 6 (six) hours as needed for muscle spasms. 06/16/16  Yes Jannifer Franklin,  Elon Alas, MD  dicyclomine (BENTYL) 20 MG tablet Take 20 mg by mouth daily as needed for spasms.  06/17/16  Yes [provider]  Etanercept (ENBREL [place]) Inject into the skin once a week.   Yes [provider]  fluticasone (FLONASE) 50 MCG/ACT nasal spray Instill 1-2 sprays in each nostril daiy PRN FOR ALLERGIES 05/06/16  Yes [provider]  folic acid (FOLVITE) 1 MG tablet Take 1 mg by mouth daily.    Yes [provider]  LINZESS 145 MCG CAPS capsule Take 1 capsule by mouth daily with first meal for 30 days 07/13/16  Yes [provider]  meloxicam (MOBIC) 15 MG tablet take 1 TABLET BY MOUTH EVERY DAY AS NEEDED FOR SPASMS with food 07/13/16  Yes [provider]    methotrexate (RHEUMATREX) 2.5 MG tablet Take 25 mg by mouth once a week. Takes 5 tablets twice daily only on Saturday. 09/09/14  Yes [provider]  omeprazole (PRILOSEC) 40 MG capsule 1 capsule. 07/03/16  Yes [provider]  pregabalin (LYRICA) 200 MG capsule Take 1 capsule (200 mg total) by mouth 2 (two) times daily. 08/30/16  Yes Kathrynn Ducking, MD  triamcinolone cream (KENALOG) 0.1 % Apply 1 application topically 2 (two) times daily. Apply to the affected area. 08/29/16  Yes [place], Zack Seal, PA-C    ROS:  Out of a complete 14 system review of symptoms, the patient complains only of the following symptoms, and all other reviewed systems are negative.  Decreased activity, chills, fatigue, excessive sweating Ear pain, ringing in the ears Eye itching, light sensitivity, eye pain, blurred vision Chest pain Frequent waking, daytime sleepiness Frequency of urination, blood in urine, urinary urgency Joint pain, joint swelling, back pain, achy muscles, muscle cramps, walking difficulty, neck pain, neck stiffness Dizziness, headache  Blood pressure 116/76, pulse 61, height 5\' 8"  (1.727 m), weight 202 lb 8 oz (91.9 kg).  Physical Exam  General: The patient is alert and cooperative at the time of the examination.  Neuromuscular: Patient lacks about 30 of full lateral rotation of the cervical spine bilaterally.  Skin: No significant peripheral edema is noted.   Neurologic Exam  Mental status: The patient is alert and oriented x 3 at the time of the examination. The patient has apparent normal recent and remote memory, with an apparently normal attention span and concentration ability.   Cranial nerves: Facial symmetry is present. Speech is normal, no aphasia or dysarthria is noted. Extraocular movements are full. Visual fields are full. Pupils are equal, round, and are reactive to light. Discs appear to be flat bilaterally. No venous pulsations can be  seen.  Motor: The patient has good strength in all 4 extremities.  Sensory examination: Soft touch sensation is symmetric on the face, arms, and legs.  Coordination: The patient has good finger-nose-finger and heel-to-shin bilaterally.  Gait and station: The patient has a normal gait. Tandem gait is normal. Romberg is negative. No drift is seen.  Reflexes: Deep tendon reflexes are symmetric.   Assessment/Plan:  1. History of pseudotumor cerebri  2. Chronic daily headache  3. Bipolar disorder  4. Fibromyalgia   This patient continues to have ongoing daily headaches, she has generalized neuromuscular discomfort. She remains on Lyrica, we will send her for a lumbar puncture to document whether or not she has ongoing symptoms of pseudotumor cerebri. She is not currently on any medications for this disease entity. She claims that she does go to a ophthalmologist on  a regular basis, she has not told that she has papilledema. The patient will follow-up for the next scheduled visit in October 2018. We may adjust medical therapy depending upon the results of the spinal tap.  Jill Alexanders MD 09/10/2016 12:12 PM  [facility] [address] [place], [place] [address]  Phone [phone] Fax [phone]

## 2016-09-17 ENCOUNTER — Encounter: Payer: Self-pay | Admitting: Neurology

## 2016-09-17 ENCOUNTER — Telehealth: Payer: Self-pay | Admitting: Neurology

## 2016-09-17 NOTE — Telephone Encounter (Signed)
Patient called office in reference to post offer ADA forms needing to be completed advised of $50 fee will be dropping off at office Monday.  Patient states she needs a letter for ADA accommodations for her apartment complex due to having a lot of dizziness, can't bend over due to headaches.  Patient states her animal provides stability.  Please call

## 2016-09-17 NOTE — Telephone Encounter (Signed)
I called the patient. The patient works as a Pharmacist, hospital, she claims that she has dizziness when she bends over or stoops over. She will require some accommodations for this at work.  She claims that she has a [place] shepherd that is necessary in her home environment to pick things up off the floor for her and bring them to her so that she does not have to bend over. She wants to have a letter indicating that she can have a dog in her apartment.

## 2016-09-20 NOTE — Telephone Encounter (Signed)
Called and spoke with patient. She would like letter mailed. Verified address on file. Mailed to pt as requested.

## 2016-09-21 ENCOUNTER — Inpatient Hospital Stay
Admission: RE | Admit: 2016-09-21 | Discharge: 2016-09-21 | Disposition: A | Discharge status: Home / Self Care | Payer: Medicaid Other | Source: Ambulatory Visit | Attending: Neurology | Admitting: Neurology

## 2016-09-27 ENCOUNTER — Inpatient Hospital Stay
Admission: RE | Admit: 2016-09-27 | Discharge: 2016-09-27 | Disposition: A | Discharge status: Home / Self Care | Payer: Medicaid Other | Source: Ambulatory Visit | Attending: Neurology | Admitting: Neurology

## 2016-09-27 NOTE — Discharge Instructions (Signed)

## 2016-10-07 ENCOUNTER — Encounter: Payer: Medicaid Other | Attending: Family Medicine | Admitting: Registered"

## 2016-10-07 DIAGNOSIS — R635 Abnormal weight gain: Secondary | ICD-10-CM | POA: Insufficient documentation

## 2016-10-07 DIAGNOSIS — Z713 Dietary counseling and surveillance: Secondary | ICD-10-CM | POA: Insufficient documentation

## 2016-10-07 NOTE — Patient Instructions (Addendum)
Consider bananas, rice, apple, toast Ideas for protein: Fairlife milk, chicken  Stop using the FODMAP app for limiting food Consider using a whey protein found at a place like deep roots. Nourish is protein drink that is FODMAP friendly.

## 2016-10-07 NOTE — Progress Notes (Signed)
Medical Nutrition Therapy:  Appt start time: 1005 end time:  1035  Follow-up Assessment:  Pt states IBS symptoms have not improve with medication and diet change over the last month. Pt reports she had endoscopy yesterday and will get results in 2 weeks.  Pt reports symptoms of pinching pain sensation up high in abdomen and pinching and/or cramping in lower.   Pt states she has started drinking coffee again and while not drinking it GERD symptoms did not improve.   Preferred Learning Style:   No preference indicated   Learning Readiness:   Ready  MEDICATIONS: reviewed  Dietary recall:  Breakfast: Oatmeal, almond milk OR none 3-4 times week, not when she is going somewhere due to fear of IBS symptoms 2 pm: Mashed potates, green been, meatloaf No dinner last night due to stomach pain  Estimated energy needs: 1800 calories 200 g carbohydrates 135 g protein 50 g fat  Progress Towards Goal(s):  In progress.   Nutritional Diagnosis:  NI-5.8.3 Inappropriate intake of types of carbohydrates (specify): FODMAPs As related to carbohydrate that may not be digesting well.  As evidenced by dietary recall and pt reported IBS symptoms.    Intervention:  Nutrition Education. Discussed IBS symptoms and FODMAPs connection and how no improvement in symptoms after eliminating many of these foods is not necessary, and important to continue to look for cause of GI issues to address cause and not just treat symptoms. Discussed food that may be easier on the stomach including the BRAT diet and easy to digest proteins. Discussed potential celiac disease and importance of not starting a gluten-free diet before being tested. Discussed need for specialized nutrition when celiac disease is present.  Plan: Consider bananas, rice, apple, toast Ideas for protein: Fairlife milk, chicken  Stop using the FODMAP app for limiting food Consider using a whey protein found at a place like deep roots. Nourish is  protein drink that is FODMAP friendly. (although FODMAP probably not necessary, may be easier on stomach)  Teaching Method Utilized: Visual Auditory  Handouts/product sample given during visit include: * Ripple milk alternative products handout * Boost Optimum sample Lot# [id number], exp Mar 07, 2017  Barriers to learning/adherence to lifestyle change: none  Demonstrated degree of understanding via:  Teach Back   Monitoring/Evaluation:  Dietary intake, exercise, IBS symptoms, and body weight prn

## 2016-10-27 NOTE — Discharge Instructions (Signed)

## 2016-10-28 ENCOUNTER — Ambulatory Visit
Admission: RE | Admit: 2016-10-28 | Discharge: 2016-10-28 | Disposition: A | Discharge status: Home / Self Care | Payer: Medicaid Other | Source: Ambulatory Visit | Attending: Neurology | Admitting: Neurology

## 2016-10-28 ENCOUNTER — Other Ambulatory Visit: Payer: Self-pay | Admitting: Physical Medicine & Rehabilitation

## 2016-10-28 ENCOUNTER — Telehealth: Payer: Self-pay | Admitting: Neurology

## 2016-10-28 VITALS — BP 101/58 | HR 61

## 2016-10-28 DIAGNOSIS — M47816 Spondylosis without myelopathy or radiculopathy, lumbar region: Secondary | ICD-10-CM

## 2016-10-28 DIAGNOSIS — G932 Benign intracranial hypertension: Secondary | ICD-10-CM

## 2016-10-28 LAB — CSF CELL COUNT WITH DIFFERENTIAL
RBC Count, CSF: 1 cells/uL (ref 0–10)
WBC CSF: 1 {cells}/uL (ref 0–5)

## 2016-10-28 LAB — PROTEIN, CSF: TOTAL PROTEIN, CSF: 27 mg/dL (ref 15–45)

## 2016-10-28 LAB — GLUCOSE, CSF: GLUCOSE CSF: 65 mg/dL (ref 40–80)

## 2016-10-28 NOTE — Telephone Encounter (Signed)
I called the patient. The opening pressure was 21 cm, the patient does not have pseudotumor cerebri, the initial diagnosis was made while the patient was on lithium.

## 2016-10-28 NOTE — Telephone Encounter (Signed)
CSF results are normal, the results were sent to the patient by my chart.

## 2016-10-28 NOTE — Telephone Encounter (Signed)
FYI- Lab results are being faxed from [facility].

## 2016-10-28 NOTE — Telephone Encounter (Signed)
Noted  

## 2016-11-01 ENCOUNTER — Other Ambulatory Visit: Payer: Self-pay | Admitting: Adult Health

## 2016-11-24 ENCOUNTER — Ambulatory Visit: Payer: Medicaid Other | Admitting: Neurology

## 2017-01-04 ENCOUNTER — Encounter (HOSPITAL_COMMUNITY): Payer: Self-pay | Admitting: Emergency Medicine

## 2017-01-04 ENCOUNTER — Emergency Department (HOSPITAL_COMMUNITY)
Admission: EM | Admit: 2017-01-04 | Discharge: 2017-01-04 | Discharge status: Against Medical Advice | Payer: Medicaid Other | Attending: Emergency Medicine | Admitting: Emergency Medicine

## 2017-01-04 DIAGNOSIS — Z5321 Procedure and treatment not carried out due to patient leaving prior to being seen by health care provider: Secondary | ICD-10-CM | POA: Insufficient documentation

## 2017-01-04 DIAGNOSIS — R1110 Vomiting, unspecified: Secondary | ICD-10-CM | POA: Diagnosis present

## 2017-01-04 LAB — COMPREHENSIVE METABOLIC PANEL
ALT: 22 U/L (ref 14–54)
AST: 26 U/L (ref 15–41)
Albumin: 4.1 g/dL (ref 3.5–5.0)
Alkaline Phosphatase: 107 U/L (ref 38–126)
Anion gap: 7 (ref 5–15)
BUN: 16 mg/dL (ref 6–20)
CHLORIDE: 107 mmol/L (ref 101–111)
CO2: 24 mmol/L (ref 22–32)
CREATININE: 0.86 mg/dL (ref 0.44–1.00)
Calcium: 9.6 mg/dL (ref 8.9–10.3)
GFR calc Af Amer: >60 mL/min (ref >60)
GLUCOSE: 92 mg/dL (ref 65–99)
Potassium: 3.4 mmol/L — ABNORMAL LOW (ref 3.5–5.1)
SODIUM: 138 mmol/L (ref 135–145)
Total Bilirubin: 0.7 mg/dL (ref 0.3–1.2)
Total Protein: 7.5 g/dL (ref 6.5–8.1)

## 2017-01-04 LAB — CBC
HCT: 37.1 % (ref 36.0–46.0)
Hemoglobin: 12.2 g/dL (ref 12.0–15.0)
MCH: 27.8 pg (ref 26.0–34.0)
MCHC: 32.9 g/dL (ref 30.0–36.0)
MCV: 84.5 fL (ref 78.0–100.0)
PLATELETS: 307 10*3/uL (ref 150–400)
RBC: 4.39 MIL/uL (ref 3.87–5.11)
RDW: 13.1 % (ref 11.5–15.5)
WBC: 5.6 10*3/uL (ref 4.0–10.5)

## 2017-01-04 LAB — LIPASE, BLOOD: LIPASE: 25 U/L (ref 11–51)

## 2017-01-04 MED ORDER — ONDANSETRON 4 MG PO TBDP: 4.0000 mg | ORAL_TABLET | Freq: Once | ORAL | Status: DC | PRN

## 2017-01-04 NOTE — ED Notes (Signed)
After obtaining Pt's vitals pt requested restroom assistance. Triage tech went to get a wheelchair for her to take her to and assist her in the restroom. After returning to the room with wheelchair Tech asked Pt how she could be assisted, Pt became ugly with triage tech stating she just could not do it (go to the bathroom) that she was having too much pain, and refused assistance. Pt's triage was completed and tech was instructed to have Pt wait out in lobby per nurse instruction. Pt became very rude with tech, yelling at her stating she was having extreme pain and that it was ridiculous that she had to wait out in the lobby, and our system is broken because she shouldn't have to wait because she came in EMS, and that her 93 yr old daughter is scared and she refused to get in the wheelchair and sit in the lobby. Tech informed nurse.

## 2017-01-04 NOTE — ED Notes (Signed)
After phlebotomy finished with blood drawl in triage room, went in with wheelchair to assist patient to lobby to wait until we get available room.   Patient states that she can't walk now due to her fibromyalgia now flared up due to abd pain and vomiting at the gas station.  And unable to walk to get into wheelchair. Patient instructed that I would help patient get into wheelchair. Patient states that she is not going to go sit in lobby to wait because she cant sit out there with "all the other sick people!" I will get in wheelchair and yall can wheel me outside where I will get a ride to come get me.  Informed patient that again, I'll be glad to help you get into wheelchair and wheel you outside while you wait for a ride.  Patient then raises her voice, "I wouldn't have came in by EMS for my 10/10 abd after having 6 kids and vomiting at the gas station if I could sit up.  You see me laying here in 10/10 pain?!?"   Informed patient that unfortunately we have others currently sitting in lobby that have also been vomiting for 2-3 hours, we don't have any available beds at this time and would get her back as soon as we can.   Patient states that "I;ll get in wheelchair with help and wait outside for a ride but I will not wait in lobby!"   As I was moving foot rest of triage chair to better assist patient into wheelchair, patient demanded I have help moving her.  I called out to triage desk asking for moving help.  Jake RN came in and tried assisting this RN with helping patient to wheelchair. Patient refusing for this RN to help her into wheelchair, because she didn't like that I had lifted her legs to move foot rest out of way to help get wheelchair closer to triage chair with transfer.  Then refused to allow [place] to assist her to wheelchair.  after patient continuing to refuse these two staff members to help called security as well as this RN came out of room to ask for other staff to go in and assist  patient into wheelchair.

## 2017-01-04 NOTE — ED Triage Notes (Signed)
Per EMS-states she was at [facility] getting gas when she vomited 3 times-states history of fibromyalgia-thinks she is having an intestinal spasms-

## 2017-01-04 NOTE — ED Notes (Signed)
This RN passed patient room and Flossie Dibble was requesting help to get the patient over in to a wheelchair. Pt stated that she was unable to move to the wheelchair on her own and stated, "I need someone to move me." Before writer of this note ever touched the patient to help her, she stated that she didn't like me and didn't want my help. I stepped back and did not touch her per her request. Pt was rude and began insisting that we did not care about her and were insensitive. She eventually requested that security help her move in to a wheelchair. She stated that she would "[place], or Lyft home" because she did not think she should have to wait in the waiting room. She requested my and Carrie's names and was given them. Charge nurse was also called up and spoke with patient.

## 2017-02-03 ENCOUNTER — Ambulatory Visit: Payer: Medicaid Other | Admitting: Neurology

## 2017-02-22 ENCOUNTER — Ambulatory Visit: Payer: Medicaid Other | Admitting: Neurology

## 2017-03-04 ENCOUNTER — Other Ambulatory Visit: Payer: Self-pay | Admitting: Neurology

## 2017-03-28 ENCOUNTER — Other Ambulatory Visit: Payer: Self-pay | Admitting: Neurology

## 2017-07-28 ENCOUNTER — Other Ambulatory Visit: Payer: Self-pay | Admitting: Obstetrics and Gynecology

## 2017-07-28 DIAGNOSIS — Z1231 Encounter for screening mammogram for malignant neoplasm of breast: Secondary | ICD-10-CM

## 2017-09-01 ENCOUNTER — Inpatient Hospital Stay: Admission: RE | Admit: 2017-09-01 | Payer: Medicaid Other | Source: Ambulatory Visit

## 2017-09-19 ENCOUNTER — Ambulatory Visit: Payer: Medicaid Other | Attending: Physician Assistant | Admitting: Physical Therapy

## 2017-10-13 ENCOUNTER — Ambulatory Visit
Admission: RE | Admit: 2017-10-13 | Discharge: 2017-10-13 | Disposition: A | Discharge status: Home / Self Care | Payer: Medicaid Other | Source: Ambulatory Visit | Attending: Obstetrics and Gynecology | Admitting: Obstetrics and Gynecology

## 2017-10-13 ENCOUNTER — Other Ambulatory Visit: Payer: Self-pay

## 2017-10-13 ENCOUNTER — Ambulatory Visit: Payer: Medicaid Other

## 2017-10-13 ENCOUNTER — Encounter (HOSPITAL_COMMUNITY): Payer: Self-pay

## 2017-10-13 ENCOUNTER — Emergency Department (HOSPITAL_COMMUNITY)
Admission: EM | Admit: 2017-10-13 | Discharge: 2017-10-13 | Discharge status: Against Medical Advice | Payer: Medicaid Other | Attending: Emergency Medicine | Admitting: Emergency Medicine

## 2017-10-13 DIAGNOSIS — Z1231 Encounter for screening mammogram for malignant neoplasm of breast: Secondary | ICD-10-CM

## 2017-10-13 DIAGNOSIS — R102 Pelvic and perineal pain: Secondary | ICD-10-CM | POA: Insufficient documentation

## 2017-10-13 DIAGNOSIS — Z5321 Procedure and treatment not carried out due to patient leaving prior to being seen by health care provider: Secondary | ICD-10-CM | POA: Diagnosis not present

## 2017-10-13 LAB — CBC WITH DIFFERENTIAL/PLATELET
Abs Immature Granulocytes: 0 10*3/uL (ref 0.0–0.1)
Basophils Absolute: 0 10*3/uL (ref 0.0–0.1)
Basophils Relative: 1 %
EOS ABS: 0 10*3/uL (ref 0.0–0.7)
EOS PCT: 0 %
HEMATOCRIT: 39.5 % (ref 36.0–46.0)
HEMOGLOBIN: 12.2 g/dL (ref 12.0–15.0)
Immature Granulocytes: 0 %
LYMPHS ABS: 2.3 10*3/uL (ref 0.7–4.0)
Lymphocytes Relative: 48 %
MCH: 27.2 pg (ref 26.0–34.0)
MCHC: 30.9 g/dL (ref 30.0–36.0)
MCV: 88.2 fL (ref 78.0–100.0)
MONO ABS: 0.3 10*3/uL (ref 0.1–1.0)
MONOS PCT: 6 %
Neutro Abs: 2.2 10*3/uL (ref 1.7–7.7)
Neutrophils Relative %: 45 %
Platelets: 270 10*3/uL (ref 150–400)
RBC: 4.48 MIL/uL (ref 3.87–5.11)
RDW: 13.2 % (ref 11.5–15.5)
WBC: 4.8 10*3/uL (ref 4.0–10.5)

## 2017-10-13 LAB — BASIC METABOLIC PANEL
Anion gap: 7 (ref 5–15)
BUN: 10 mg/dL (ref 6–20)
CALCIUM: 9.7 mg/dL (ref 8.9–10.3)
CHLORIDE: 106 mmol/L (ref 98–111)
CO2: 28 mmol/L (ref 22–32)
CREATININE: 0.91 mg/dL (ref 0.44–1.00)
GFR calc Af Amer: >60 mL/min (ref >60)
GFR calc non Af Amer: >60 mL/min (ref >60)
GLUCOSE: 93 mg/dL (ref 70–99)
Potassium: 4.3 mmol/L (ref 3.5–5.1)
Sodium: 141 mmol/L (ref 135–145)

## 2017-10-13 NOTE — ED Notes (Signed)
Called pt's name X3 to bring back to room. No answer, RN at nurse first was notified.

## 2017-10-13 NOTE — ED Triage Notes (Signed)
Pt presents with sudden onset of suprapubic pain that began yesterday.  Pt had pelvic exam at OB/GYN for bladder issues and denies any pain during procedure.  Pt reports pain is very sharp at times, causing her to stop walking.  Pt denies any vaginal discharge; reports difficulty beginning to void but denies dysuria.

## 2017-10-13 NOTE — ED Provider Notes (Signed)
Patient placed in Quick Look pathway, seen and evaluated   Chief Complaint: abdominal pain  HPI: Brittney [name] is a Brittney [name] female who presents to the ED  with sudden onset of suprapubic pain that began yesterday.  Pt had pelvic exam at OB/GYN for bladder issues and denies any pain during procedure.  Pt reports pain is very sharp at times, causing her to stop walking.  Pt denies any vaginal discharge; reports difficulty beginning to void but denies dysuria.   ROS: Abdomen: suprapubic pain  Physical Exam:  BP 112/72 (BP Location: Right Arm)   Pulse 66   Temp 98.6 F (37 C) (Oral)   Resp 20   Ht 5\' 8"  (1.727 m)   Wt 88 kg   SpO2 100%   BMI 29.50 kg/m    Gen: No distress  Neuro: Awake and Alert  Skin: Warm and dry     Initiation of care has begun. The patient has been counseled on the process, plan, and necessity for staying for the completion/evaluation, and the remainder of the medical screening examination    Ashley Murrain, NP 10/13/17 [place], MD 10/31/17 1247

## 2017-10-24 ENCOUNTER — Ambulatory Visit: Payer: Medicaid Other | Admitting: Physical Therapy

## 2017-11-02 ENCOUNTER — Other Ambulatory Visit: Payer: Self-pay

## 2017-11-02 ENCOUNTER — Ambulatory Visit: Payer: Medicaid Other | Attending: Physician Assistant | Admitting: Physical Therapy

## 2017-11-02 ENCOUNTER — Encounter: Payer: Self-pay | Admitting: Physical Therapy

## 2017-11-02 DIAGNOSIS — M6281 Muscle weakness (generalized): Secondary | ICD-10-CM

## 2017-11-02 DIAGNOSIS — M25512 Pain in left shoulder: Secondary | ICD-10-CM

## 2017-11-02 NOTE — Addendum Note (Signed)
Addended by: Raeford Razor L on: 11/02/2017 01:06 PM   Modules accepted: Orders

## 2017-11-02 NOTE — Therapy (Addendum)
[place], [place], [address] Phone: [phone]   Fax:  [phone]  Physical Therapy Evaluation  Patient Details  Name: Brittney [name] MRN: [id number] Date of Birth: [birth year] Referring Provider: Carolee Rota, NP, Dr.  Alphonzo Grieve at [facility]    Encounter Date: 11/02/2017  PT End of Session - 11/02/17 1055    Visit Number  1    Number of Visits  4    Date for PT Re-Evaluation  11/18/17    Authorization Type  MCD     Authorization Time Period  submit today 11/02/17    PT Start Time  1016    PT Stop Time  1105    PT Time Calculation (min)  49 min    Activity Tolerance  Patient limited by pain;Other (comment)   anxiety    Behavior During Therapy  WFL for tasks assessed/performed       Past Medical History:  Diagnosis Date  . Anxiety   . Arthritis    KNEES ELBOWS HIPS SHOULDER FINGERS  . Asthma   . Back pain   . Bipolar disorder ([place])   . Depression    BIPOLAR  . Dyspnea   . Fibromyalgia   . GERD (gastroesophageal reflux disease)   . Headache(784.0)    MIGRAINES  . Hyperlipidemia   . Memory difficulties 06/21/2014  . Neck pain   . OA (osteoarthritis) of knee   . Obesity   . 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  . 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 CARDIAC CATHETERIZATION  2010  . DILATION AND CURETTAGE OF UTERUS  1994  . LAPAROSCOPY N/A 11/21/2012   Procedure: LAPAROSCOPY OPERATIVE REMOVAL OF RIGHT TUBE AND OVARY AND FLUID FROM MASS;  Surgeon: Melina Schools, MD;  Location: [facility];  Service: Gynecology;  Laterality: N/A;  1 1/2hrs OR time  . OOPHORECTOMY      There were no vitals filed for this visit.   Subjective Assessment - 11/02/17 1020    Subjective  Patient was getting up from the floor about a month ago and thinks she may have "dislocated" her L shoulder.  It continues to hurt.  She has done  this at will as a child  but it never reallly hurt her.  She has trouble lifting with her LUE, reaching it out and up overhead.  She cant use her LUE for ADLs, hygiene.  SHe was not aware that her MRI showed a tear.      Limitations  Lifting;House hold activities    Diagnostic tests  XR and MRI at [facility]: complex Labral tear superior, anterior and ant inf , tendinosis     Patient Stated Goals  Patient would like to be Pain free and get back to using arm normally.She is active with yoga, swimming.      Currently in Pain?  Yes    Pain Score  8     Pain Location  Shoulder    Pain Orientation  Left;Anterior;Other (Comment);Proximal    Pain Descriptors / Indicators  Radiating;Other (Comment)   pinching, deep    Pain Type  Acute pain    Pain Onset  More than a month ago    Pain Frequency  Constant    Aggravating Factors   constant, moving arm     Pain Relieving Factors  ice, PA told her to do pendulums , has  not tried pillows     Effect of Pain on Daily Activities  unable to be comfortable at all, mobility          [facility] PT Assessment - 11/02/17 0001      Assessment   Medical Diagnosis  L shoulder impingment   MRI showed complex labral tear    Referring Provider  Carolee Rota, NP, Dr.  Alphonzo Grieve at [facility]     Onset Date/Surgical Date  09/07/17   1 month    Hand Dominance  Right    Next MD Visit  11/03/17    Prior Therapy  NO       Precautions   Precautions  None      Restrictions   Weight Bearing Restrictions  No      Balance Screen   Has the patient fallen in the past 6 months  No      [place] residence    Living Arrangements  Spouse/significant other    Type of [place]      Prior Function   Level of [place]  Unemployed    Leisure  yoga, fitness       Cognition   Overall Cognitive Status  Within Functional Limits for tasks assessed      Observation/Other Assessments   Focus on  Therapeutic Outcomes (FOTO)   NT due to MCD       Sensation   Light Touch  Appears Intact    Additional Comments  mild tingling       Posture/Postural Control   Posture/Postural Control  Postural limitations    Posture Comments  L shoulder lower than R, GH head more forward, IR       AROM   Left Shoulder Flexion  50 Degrees    Left Shoulder ABduction  60 Degrees    Left Shoulder Internal Rotation  60 Degrees   FR to lumbar    Left Shoulder External Rotation  40 Degrees   FR unable      PROM   Overall PROM Comments  pain       Strength   Left Shoulder Flexion  3-/5   pain    Left Shoulder ABduction  4/5    Left Shoulder Internal Rotation  4+/5    Left Shoulder External Rotation  4/5    Right Elbow Flexion  5/5    Right Elbow Extension  5/5    Left Elbow Flexion  3+/5    Left Elbow Extension  4/5      Palpation   Palpation comment  TTP throughout , pain along L anterior superior shoulder       Special Tests    Special Tests  --   NT due to pain and known MRI                Objective measurements completed on examination: See above findings.      [facility] Adult PT Treatment/Exercise - 11/02/17 0001      Self-Care   Self-Care  Heat/Ice Application;Other Self-Care Comments    Heat/Ice Application  ice, TENS unit     Other Self-Care Comments   ROM, HEP       Cryotherapy   Number Minutes Cryotherapy  15 Minutes    Cryotherapy Location  Shoulder    Type of Cryotherapy  Ice pack      Electrical Stimulation   Electrical Stimulation Location  L shoulder anterior     Electrical Stimulation Action  IFC     Electrical Stimulation Parameters  to tol    Electrical Stimulation Goals  Pain             PT Education - 11/02/17 1237    Education Details  PT/POC, HEP, MRI report, IFC/ice     Person(s) Educated  Patient    Methods  Explanation;Handout    Comprehension  Verbalized understanding;Verbal cues required;Need further instruction       PT Short  Term Goals - 11/02/17 1251      PT SHORT TERM GOAL #1   Title  Pt will be I with HEP for ROM, pain control     Baseline  unknown, given pendulum today     Time  2    Period  Weeks    Status  New    Target Date  11/21/17      PT SHORT TERM GOAL #2   Title  Pt will understand RICE, self care strategies for pain    Baseline  unknown , needs guidance     Time  2    Period  Weeks    Status  New    Target Date  11/21/17                Plan - 11/02/17 1254    Clinical Impression Statement  Pt presents for low complexity eval of L shoulder pain.  Prior to the MRI, the referral said it was an impingement of her L UE.  With the known labral tear, I was very conservative in my exam today.  She has severe pain accompanied with anxiety related to her shoulder.  She is unable to lift her arm >60 deg flexion and abduction.  Weakness especially in flexion.  Utilized modalities today for pain control.  She sees Dr. Smitty Cords tomrrow and the next steps will be determined.  She may need surgery.      History and Personal Factors relevant to plan of care:  L sided low back pain , PTSD, bipolar, depression, anxiety , pseudotumor cerebri     Clinical Presentation  Evolving    Clinical Presentation due to:  new information to patient, XR normal but MRI revealed complex tear of labrum.     Clinical Decision Making  Moderate    Rehab Potential  Fair    PT Frequency  2x / week   3 visits then 2 x 6 if MD allows, agrees    PT Duration  2 weeks    PT Treatment/Interventions  ADLs/Self Care Home Management;Electrical Stimulation;Neuromuscular re-education;Taping;Moist Heat;Therapeutic exercise;Patient/family education;Manual techniques;Passive range of motion;Therapeutic activities;Iontophoresis 4mg /ml Dexamethasone;Cryotherapy;Ultrasound    PT Next Visit Plan  what did MD say? PROM, pain control , AAROM if able     PT Home Exercise Plan  pendulum and table slides    Consulted and Agree with Plan of  Care  Patient       Patient will benefit from skilled therapeutic intervention in order to improve the following deficits and impairments:  Postural dysfunction, Impaired UE functional use, Impaired flexibility, Pain, Decreased strength, Decreased activity tolerance, Decreased range of motion  Visit Diagnosis: Acute pain of left shoulder - Plan: PT plan of care cert/re-cert  Muscle weakness (generalized) - Plan: PT plan of care cert/re-cert     Problem List Patient Active Problem List   Diagnosis Date Noted  . 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  .  Fibromyalgia 02/03/2015  . Tendinitis of both rotator cuffs 02/03/2015  . 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 Insomnia secondary to chronic pain 11/13/2014  . Fatigue due to sleep pattern disturbance 11/13/2014  . Memory difficulties 06/21/2014  . 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  . PTSD (post-traumatic stress disorder) 12/13/2013  . Bipolar depression ([place]) 12/12/2013  . PERICARDIAL EFFUSION 12/23/2008  . PSEUDOTUMOR CEREBRI 12/05/2008  . UNSPECIFIED PLEURAL EFFUSION 12/05/2008  . DYSPNEA 12/05/2008  . OBESITY 11/19/2008  . Migraine 11/19/2008  . ASTHMA 11/19/2008  . OTHER SPECIFIED DISORDER OF STOMACH AND DUODENUM 03/11/2008    Vincenta Steffey 11/02/2017, 2:41 PM  [facility] [address] [place], [place], [address] Phone: [phone]   Fax:  [phone]  Name: Brittney [name] MRN: [id number] Date of Birth: Oct 22, 1970   Raeford Razor, PT 11/02/17 2:41 PM Phone: [phone] Fax: [phone]

## 2017-11-02 NOTE — Patient Instructions (Signed)
Pendulums, table slides

## 2017-11-14 ENCOUNTER — Ambulatory Visit: Payer: Medicaid Other | Admitting: Physical Therapy

## 2017-11-14 ENCOUNTER — Encounter: Payer: Self-pay | Admitting: Physical Therapy

## 2017-11-14 DIAGNOSIS — M6281 Muscle weakness (generalized): Secondary | ICD-10-CM

## 2017-11-14 DIAGNOSIS — M25512 Pain in left shoulder: Secondary | ICD-10-CM

## 2017-11-14 NOTE — Therapy (Addendum)
[place], [place], [address] Phone: [phone]   Fax:  [phone]  Physical Therapy Treatment/Discharge  Patient Details  Name: Brittney [name] MRN: [id number] Date of Birth: 1971/01/12 Referring Provider (PT): Carolee Rota, NP, Dr.  Alphonzo Grieve at [facility]    Encounter Date: 11/14/2017  PT End of Session - 11/14/17 1304    Visit Number  2    Number of Visits  4    Date for PT Re-Evaluation  11/18/17    Authorization Type  MCD     Authorization Time Period  9/27 through  10/10    Authorization - Visit Number  1    Authorization - Number of Visits  3    PT Start Time  3267    PT Stop Time  1235    PT Time Calculation (min)  42 min    Activity Tolerance  Patient tolerated treatment well    Behavior During Therapy  [facility] for tasks assessed/performed       Past Medical History:  Diagnosis Date  . Anxiety   . Arthritis    KNEES ELBOWS HIPS SHOULDER FINGERS  . Asthma   . Back pain   . Bipolar disorder ([place])   . Depression    BIPOLAR  . Dyspnea   . Fibromyalgia   . GERD (gastroesophageal reflux disease)   . Headache(784.0)    MIGRAINES  . Hyperlipidemia   . Memory difficulties 06/21/2014  . Neck pain   . OA (osteoarthritis) of knee   . Obesity   . 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  . 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 CARDIAC CATHETERIZATION  2010  . DILATION AND CURETTAGE OF UTERUS  1994  . LAPAROSCOPY N/A 11/21/2012   Procedure: LAPAROSCOPY OPERATIVE REMOVAL OF RIGHT TUBE AND OVARY AND FLUID FROM MASS;  Surgeon: Melina Schools, MD;  Location: [facility];  Service: Gynecology;  Laterality: N/A;  1 1/2hrs OR time  . OOPHORECTOMY      There were no vitals filed for this visit.  Subjective Assessment - 11/14/17 1159    Subjective  Had an injection last Friday.  has had a second opinion and she needs  surgery.     Currently in Pain?  Yes    Pain Score  4    pain at rest 2/10   Pain Location  Shoulder    Pain Orientation  Left    Pain Descriptors / Indicators  Aching;Constant;Sharp   stinging   Pain Radiating Towards  to elbow    Pain Frequency  Constant    Aggravating Factors   any movement    Pain Relieving Factors  ice    Effect of Pain on Daily Activities  needs help with dressing,  ADL.      Multiple Pain Sites  --   hip sore long standing                      [facility] Adult PT Treatment/Exercise - 11/14/17 0001      Self-Care   Heat/Ice Application  use of compression / cold pack at home    Other Self-Care Comments   anatomy shoulder,  labrum  biceps long head   sleeping posture,  rolled towel and pillow pain increased     Shoulder Exercises: Seated   Flexion  10 reps   table slides  cued initially   Other Seated Exercises  UE Ranger flexion, circles,  IR      Shoulder Exercises: Standing   Other Standing Exercises  pendulum 2 ways and circles 2 ways,  20 x each  this feels good      Manual Therapy   Manual Therapy  Taping    Kinesiotex  Inhibit Muscle      Kinesiotix   Inhibit Muscle   biceps             PT Education - 11/14/17 1304    Education Details  Self care.  Anatomy,  exercise form.    Person(s) Educated  Patient    Methods  Explanation;Demonstration    Comprehension  Returned demonstration;Verbalized understanding       PT Short Term Goals - 11/14/17 1309      PT SHORT TERM GOAL #1   Title  Pt will be I with HEP for ROM, pain control     Baseline  independent witrh exercises so far.  Uses TENS and cold pack with compression for pain control    Time  2    Period  Weeks    Status  On-going      PT SHORT TERM GOAL #2   Title  Pt will understand RICE, self care strategies for pain    Baseline  See above,  still learning    Time  2    Period  Weeks    Status  On-going               Plan - 11/14/17 1306     Clinical Impression Statement  110 degrees flexion with table slide.  Injection was helpful.  She has been doing the exercises.  She has the TENS and it is helpful for a few hours .  She has a cold pack shoulder unit with a compression coponent that also helps pain.  pain .5 to 1/10 at end of session.     PT Next Visit Plan   PROM, pain control , AAROM if able Assess tape.  She is trying to decide the timing of surgery so she may cancel.    PT Home Exercise Plan  pendulum and table slides    Consulted and Agree with Plan of Care  Patient       Patient will benefit from skilled therapeutic intervention in order to improve the following deficits and impairments:     Visit Diagnosis: Acute pain of left shoulder  Muscle weakness (generalized)     Problem List Patient Active Problem List   Diagnosis Date Noted  . 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  . Fibromyalgia 02/03/2015  . Tendinitis of both rotator cuffs 02/03/2015  . 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 Insomnia secondary to chronic pain 11/13/2014  . Fatigue due to sleep pattern disturbance 11/13/2014  . Memory difficulties 06/21/2014  . 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  . PTSD (post-traumatic stress disorder) 12/13/2013  . Bipolar depression ([place]) 12/12/2013  . PERICARDIAL EFFUSION 12/23/2008  . PSEUDOTUMOR CEREBRI 12/05/2008  . UNSPECIFIED PLEURAL EFFUSION 12/05/2008  . DYSPNEA 12/05/2008  . OBESITY 11/19/2008  . Migraine 11/19/2008  . ASTHMA 11/19/2008  . OTHER SPECIFIED DISORDER OF STOMACH AND DUODENUM 03/11/2008    HARRIS,KAREN PTA 11/14/2017, 1:11 PM  [facility] [address] [place], [place], [address] Phone: [phone]   Fax:  [phone]  Name: Brittney [name] MRN: [id number] Date of Birth: 09-20-1970  PHYSICAL THERAPY DISCHARGE SUMMARY  Visits from Start of Care: 2  Current functional level  related to goals / functional outcomes: unknown   Remaining deficits: unknown   Education / Equipment: RICE  Plan: Patient agrees to discharge.  Patient goals were not met. Patient is being discharged due to not returning since the last visit.  ?????    Raeford Razor, PT 10/25/18 2:15 PM Phone: [phone] Fax: [phone]

## 2017-11-16 ENCOUNTER — Ambulatory Visit: Payer: Medicaid Other | Admitting: Physical Therapy

## 2017-11-21 ENCOUNTER — Ambulatory Visit: Payer: Medicaid Other | Attending: Physician Assistant | Admitting: Physical Therapy

## 2018-05-07 ENCOUNTER — Emergency Department (HOSPITAL_COMMUNITY)
Admission: EM | Admit: 2018-05-07 | Discharge: 2018-05-07 | Disposition: A | Discharge status: Home / Self Care | Payer: Medicaid Other | Attending: Emergency Medicine | Admitting: Emergency Medicine

## 2018-05-07 ENCOUNTER — Other Ambulatory Visit: Payer: Self-pay

## 2018-05-07 DIAGNOSIS — M62830 Muscle spasm of back: Secondary | ICD-10-CM | POA: Insufficient documentation

## 2018-05-07 DIAGNOSIS — Z79899 Other long term (current) drug therapy: Secondary | ICD-10-CM | POA: Insufficient documentation

## 2018-05-07 DIAGNOSIS — J45909 Unspecified asthma, uncomplicated: Secondary | ICD-10-CM | POA: Diagnosis not present

## 2018-05-07 DIAGNOSIS — M546 Pain in thoracic spine: Secondary | ICD-10-CM | POA: Diagnosis not present

## 2018-05-07 DIAGNOSIS — Z87891 Personal history of nicotine dependence: Secondary | ICD-10-CM | POA: Insufficient documentation

## 2018-05-07 MED ORDER — METHOCARBAMOL 500 MG PO TABS
750.0000 mg | ORAL_TABLET | Freq: Once | ORAL | Status: AC
  Administered 2018-05-07: 750 mg via ORAL
  Filled 2018-05-07: qty 2

## 2018-05-07 MED ORDER — KETOROLAC TROMETHAMINE 30 MG/ML IJ SOLN
15.0000 mg | Freq: Once | INTRAMUSCULAR | Status: AC
  Administered 2018-05-07: 15 mg via INTRAVENOUS
  Filled 2018-05-07: qty 1

## 2018-05-07 NOTE — ED Notes (Signed)
Bed: WU88 Expected date:  Expected time:  Means of arrival:  Comments: 48 yo F/Back pain

## 2018-05-07 NOTE — ED Provider Notes (Signed)
[facility] DEPT Provider Note   CSN: [id number] Arrival date & time: 05/07/18  2219    History   Chief Complaint Chief Complaint  Patient presents with  . Back Pain    HPI Brittney [name] is a 48 y.o. female.     Patient with history of rheumatoid arthritis, OA, fibromyalgia, GERD, migraine, HLD, presents by EMS for back pain. She reports onset of pain on the right mid-back earlier today that has been progressive throughout the day despite rest, use of heat, Tylenol. The pain is constant but worse with movement when it becomes sharp and stabbing. No known injury or trauma. No history of similar pain. She denies cough, SOB, fever, chest pain, abdominal pain, nausea. She has a significant history of arthritis but denies joint pain or swelling currently. She has been out of her methotrexate and Enbrel for several weeks.   The history is provided by the patient. No language interpreter was used.    Past Medical History:  Diagnosis Date  . Anxiety   . Arthritis    KNEES ELBOWS HIPS SHOULDER FINGERS  . Asthma   . Back pain   . Bipolar disorder ([place])   . Depression    BIPOLAR  . Dyspnea   . Fibromyalgia   . GERD (gastroesophageal reflux disease)   . Headache(784.0)    MIGRAINES  . Hyperlipidemia   . Memory difficulties 06/21/2014  . Neck pain   . OA (osteoarthritis) of knee   . Obesity   . 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  . Seizures ([place])    FACIAL SEIZURES LAST 4 DAYS AGO    Patient Active Problem List   Diagnosis Date Noted  . 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  . Fibromyalgia 02/03/2015  . Tendinitis of both rotator cuffs 02/03/2015  . 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 Insomnia secondary to chronic pain 11/13/2014  . Fatigue due to sleep pattern disturbance 11/13/2014  . Memory difficulties 06/21/2014  . 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  . PTSD (post-traumatic  stress disorder) 12/13/2013  . Bipolar depression ([place]) 12/12/2013  . PERICARDIAL EFFUSION 12/23/2008  . PSEUDOTUMOR CEREBRI 12/05/2008  . UNSPECIFIED PLEURAL EFFUSION 12/05/2008  . DYSPNEA 12/05/2008  . OBESITY 11/19/2008  . Migraine 11/19/2008  . ASTHMA 11/19/2008  . OTHER SPECIFIED DISORDER OF STOMACH AND DUODENUM 03/11/2008    Past Surgical History:  Procedure Laterality Date  . CARDIAC CATHETERIZATION  2010  . DILATION AND CURETTAGE OF UTERUS  1994  . LAPAROSCOPY N/A 11/21/2012   Procedure: LAPAROSCOPY OPERATIVE REMOVAL OF RIGHT TUBE AND OVARY AND FLUID FROM MASS;  Surgeon: Melina Schools, MD;  Location: [facility];  Service: Gynecology;  Laterality: N/A;  1 1/2hrs OR time  . OOPHORECTOMY       OB History   No obstetric history on file.      Home Medications    Prior to Admission medications   Medication Sig Start Date End Date Taking? Authorizing Provider  albuterol (PROAIR HFA) 108 (90 Base) MCG/ACT inhaler ProAir HFA 90 mcg/actuation aerosol inhaler    [provider]  buPROPion (WELLBUTRIN) 100 MG tablet 1 tablet twice a day Orally 30 day(s) 08/02/16   [provider]  cyclobenzaprine (FLEXERIL) 10 MG tablet Take 1 tablet (10 mg total) by mouth every 6 (six) hours as needed for muscle spasms. 03/07/17   Kathrynn Ducking, MD  dexlansoprazole (DEXILANT) 60 MG capsule Take 60 mg by mouth daily.  [provider]  dicyclomine (BENTYL) 20 MG tablet Take 20 mg by mouth daily as needed for spasms.  06/17/16   [provider]  Etanercept (ENBREL [place]) Inject into the skin once a week.    [provider]  fluticasone (FLONASE) 50 MCG/ACT nasal spray Instill 1-2 sprays in each nostril daiy PRN FOR ALLERGIES 05/06/16   [provider]  folic acid (FOLVITE) 1 MG tablet Take 1 mg by mouth daily.     [provider]  hyoscyamine (ANASPAZ) 0.125 MG TBDP disintergrating tablet Place 0.125 mg under the tongue every 4 (four) hours as  needed.    [provider]  LINZESS 145 MCG CAPS capsule Take 1 capsule by mouth daily with first meal for 30 days 07/13/16   [provider]  meloxicam (MOBIC) 15 MG tablet take 1 TABLET BY MOUTH EVERY DAY AS NEEDED FOR SPASMS with food 07/13/16   [provider]  methotrexate (RHEUMATREX) 2.5 MG tablet Take 25 mg by mouth once a week. Takes 5 tablets twice daily only on Saturday. 09/09/14   [provider]  omeprazole (PRILOSEC) 40 MG capsule 1 capsule. 07/03/16   [provider]  pregabalin (LYRICA) 200 MG capsule Take 1 capsule (200 mg total) by mouth 2 (two) times daily. Patient not taking: Reported on 11/02/2017 08/30/16   Kathrynn Ducking, MD  triamcinolone cream (KENALOG) 0.1 % Apply 1 application topically 2 (two) times daily. Apply to the affected area. Patient not taking: Reported on 11/02/2017 08/29/16   Doristine Devoid, PA-C    Family History Family History  Problem Relation Age of Onset  . Cancer - Lung Father   . Diabetes Father   . Cirrhosis Mother   . Migraines Sister   . Stroke Sister   . Healthy Brother   . Healthy Sister   . Healthy Sister   . Healthy Sister   . Healthy Brother   . Healthy Brother   . Healthy Brother   . Healthy Brother   . Healthy Brother   . Heart disease Maternal Grandmother   . Alzheimer's disease Maternal Grandfather   . Cancer - Lung Paternal Grandmother   . Breast cancer Neg Hx     Social History Social History   Tobacco Use  . Smoking status: Former Smoker    Last attempt to quit: 02/16/2003    Years since quitting: 15.2  . Smokeless tobacco: Never Used  Substance Use Topics  . Alcohol use: No    Alcohol/week: 0.0 standard drinks    Comment: occasionally  . Drug use: No     Allergies   Carbamazepine and Nsaids   Review of Systems Review of Systems  Constitutional: Negative for chills and fever.  Respiratory: Negative.  Negative for cough and shortness of breath.    Cardiovascular: Negative.  Negative for chest pain.  Gastrointestinal: Negative.  Negative for abdominal pain and nausea.  Genitourinary: Negative for flank pain.  Musculoskeletal: Positive for back pain.       See HPI.  Skin: Negative.  Negative for color change and wound.  Neurological: Negative.  Negative for weakness and numbness.     Physical Exam Updated Vital Signs BP 96/83   Pulse 70   Temp 98.4 F (36.9 C) (Oral)   Resp 20   Ht 5\' 8"  (1.727 m)   Wt 92.1 kg   SpO2 98%   BMI 30.87 kg/m   Physical Exam Vitals signs and nursing note reviewed.  Constitutional:  Appearance: She is well-developed.  HENT:     Head: Normocephalic.  Neck:     Musculoskeletal: Normal range of motion and neck supple.  Cardiovascular:     Rate and Rhythm: Normal rate and regular rhythm.     Heart sounds: No murmur.  Pulmonary:     Effort: Pulmonary effort is normal.     Breath sounds: Normal breath sounds. No wheezing, rhonchi or rales.  Chest:     Chest wall: No tenderness.  Abdominal:     Palpations: Abdomen is soft.     Tenderness: There is no abdominal tenderness. There is no guarding or rebound.  Musculoskeletal: Normal range of motion.        General: Tenderness present. No swelling.       Back:  Skin:    General: Skin is warm and dry.     Findings: No rash.  Neurological:     Mental Status: She is alert and oriented to person, place, and time.     Sensory: No sensory deficit.      ED Treatments / Results  Labs (all labs ordered are listed, but only abnormal results are displayed) Labs Reviewed - No data to display  EKG None  Radiology No results found.  Procedures Procedures (including critical care time)  Medications Ordered in ED Medications - No data to display   Initial Impression / Assessment and Plan / ED Course  I have reviewed the triage vital signs and the nursing notes.  Pertinent labs & imaging results that were available during my care  of the patient were reviewed by me and considered in my medical decision making (see chart for details).        Patient to ED with complaint of back pain without known injury for the past 8 hours. Constant, worse with movement. No systemic symptoms. She reports Fentanyl received in route improved at-rest symptoms.   The patient has focal tenderness to right parathoracic back. No discoloration, bruising or evidence of injury. There is no flank pain. No pulmonary symptoms. No fever. She is not on medications currently that would compromise her immune system. VSS. The pain is worse with movement. All indications on presentation and exam suggest musculoskeletal pain, likely muscle spasm.   The patient has Flexeril at home but did not think to take it. PO Robaxin and IV Toradol provided in the ED. She can be discharged home with care instructions and is encouraged to see her doctor if symptoms persist.   Final Clinical Impressions(s) / ED Diagnoses   Final diagnoses:  None   1. Back pain 2. Musculoskeletal spasm  ED Discharge Orders    None       Dennie Bible 05/07/18 2312    Daleen Bo, MD 05/11/18 [phone]

## 2018-05-07 NOTE — Discharge Instructions (Signed)
Continue your usual medications at home, including Naproxen and Flexeril. The pain in your back is felt to be musculoskeletal with spasm. You can continue hot packs to the back and rest as much as possible. Please see your doctor if pain continues after several days of therapy.
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
## 2018-05-08 ENCOUNTER — Emergency Department (HOSPITAL_COMMUNITY)
Admission: EM | Admit: 2018-05-08 | Discharge: 2018-05-08 | Disposition: A | Discharge status: Home / Self Care | Payer: Medicaid Other | Attending: Emergency Medicine | Admitting: Emergency Medicine

## 2018-05-08 ENCOUNTER — Encounter (HOSPITAL_COMMUNITY): Payer: Self-pay

## 2018-05-08 ENCOUNTER — Emergency Department (HOSPITAL_COMMUNITY): Payer: Medicaid Other

## 2018-05-08 ENCOUNTER — Other Ambulatory Visit: Payer: Self-pay

## 2018-05-08 DIAGNOSIS — M545 Low back pain, unspecified: Secondary | ICD-10-CM

## 2018-05-08 DIAGNOSIS — R109 Unspecified abdominal pain: Secondary | ICD-10-CM | POA: Diagnosis not present

## 2018-05-08 LAB — CBC WITH DIFFERENTIAL/PLATELET
Abs Immature Granulocytes: 0 10*3/uL (ref 0.00–0.07)
Band Neutrophils: 0 %
Basophils Absolute: 0 10*3/uL (ref 0.0–0.1)
Basophils Absolute: 0 10*3/uL (ref 0.0–0.1)
Basophils Relative: 0 %
Basophils Relative: 1 %
Blasts: 0 %
EOS PCT: 1 %
Eosinophils Absolute: 0 10*3/uL (ref 0.0–0.5)
Eosinophils Absolute: 0 10*3/uL (ref 0.0–0.5)
Eosinophils Relative: 1 %
HCT: 22.7 % — ABNORMAL LOW (ref 36.0–46.0)
HCT: 38.9 % (ref 36.0–46.0)
Hemoglobin: 6.8 g/dL — CL (ref 12.0–15.0)
Hemoglobin: 12.1 g/dL (ref 12.0–15.0)
Immature Granulocytes: 0 %
LYMPHS ABS: 1.9 10*3/uL (ref 0.7–4.0)
Lymphocytes Relative: 50 %
Lymphocytes Relative: 50 %
Lymphs Abs: 1.4 10*3/uL (ref 0.7–4.0)
MCH: 28.2 pg (ref 26.0–34.0)
MCH: 27.6 pg (ref 26.0–34.0)
MCHC: 30 g/dL (ref 30.0–36.0)
MCHC: 31.1 g/dL (ref 30.0–36.0)
MCV: 94.2 fL (ref 80.0–100.0)
MCV: 88.8 fL (ref 80.0–100.0)
Metamyelocytes Relative: 0 %
Monocytes Absolute: 0.2 10*3/uL (ref 0.1–1.0)
Monocytes Absolute: 0.2 10*3/uL (ref 0.1–1.0)
Monocytes Relative: 8 %
Monocytes Relative: 6 %
Myelocytes: 0 %
NEUTROS ABS: 1.5 10*3/uL — AB (ref 1.7–7.7)
Neutro Abs: 1.1 10*3/uL — ABNORMAL LOW (ref 1.7–7.7)
Neutrophils Relative %: 41 %
Neutrophils Relative %: 42 %
Other: 0 %
PLATELETS: 133 10*3/uL — AB (ref 150–400)
Platelets: 295 10*3/uL (ref 150–400)
Promyelocytes Relative: 0 %
RBC: 2.41 MIL/uL — ABNORMAL LOW (ref 3.87–5.11)
RBC: 4.38 MIL/uL (ref 3.87–5.11)
RDW: 13.3 % (ref 11.5–15.5)
RDW: 13.3 % (ref 11.5–15.5)
WBC: 2.7 10*3/uL — ABNORMAL LOW (ref 4.0–10.5)
WBC: 3.6 10*3/uL — ABNORMAL LOW (ref 4.0–10.5)
nRBC: 0 % (ref 0.0–0.2)
nRBC: 0 % (ref 0.0–0.2)
nRBC: 0 /100 WBC

## 2018-05-08 LAB — COMPREHENSIVE METABOLIC PANEL
ALBUMIN: 3.4 g/dL — AB (ref 3.5–5.0)
ALK PHOS: 60 U/L (ref 38–126)
ALT: 14 U/L (ref 0–44)
AST: 21 U/L (ref 15–41)
Anion gap: 6 (ref 5–15)
BUN: 8 mg/dL (ref 6–20)
CALCIUM: 7.7 mg/dL — AB (ref 8.9–10.3)
CO2: 19 mmol/L — AB (ref 22–32)
Chloride: 118 mmol/L — ABNORMAL HIGH (ref 98–111)
Creatinine, Ser: 0.59 mg/dL (ref 0.44–1.00)
GFR calc non Af Amer: >60 mL/min (ref >60)
GLUCOSE: 70 mg/dL (ref 70–99)
Potassium: 2.9 mmol/L — ABNORMAL LOW (ref 3.5–5.1)
SODIUM: 143 mmol/L (ref 135–145)
Total Bilirubin: 0.5 mg/dL (ref 0.3–1.2)
Total Protein: 6 g/dL — ABNORMAL LOW (ref 6.5–8.1)

## 2018-05-08 LAB — TYPE AND SCREEN
ABO/RH(D): O POS
Antibody Screen: NEGATIVE

## 2018-05-08 LAB — URINALYSIS, ROUTINE W REFLEX MICROSCOPIC
BILIRUBIN URINE: NEGATIVE
GLUCOSE, UA: NEGATIVE mg/dL
HGB URINE DIPSTICK: NEGATIVE
Ketones, ur: NEGATIVE mg/dL
Leukocytes,Ua: NEGATIVE
Nitrite: NEGATIVE
PH: 9 — AB (ref 5.0–8.0)
Protein, ur: NEGATIVE mg/dL
SPECIFIC GRAVITY, URINE: 1.005 (ref 1.005–1.030)

## 2018-05-08 LAB — LIPASE, BLOOD: Lipase: 22 U/L (ref 11–51)

## 2018-05-08 LAB — POC OCCULT BLOOD, ED: Fecal Occult Bld: NEGATIVE

## 2018-05-08 MED ORDER — MORPHINE SULFATE (PF) 2 MG/ML IV SOLN
2.0000 mg | Freq: Once | INTRAVENOUS | Status: AC
  Administered 2018-05-08: 2 mg via INTRAVENOUS
  Filled 2018-05-08: qty 1

## 2018-05-08 MED ORDER — SODIUM CHLORIDE 0.9 % IV BOLUS
1000.0000 mL | Freq: Once | INTRAVENOUS | Status: AC
  Administered 2018-05-08: 1000 mL via INTRAVENOUS

## 2018-05-08 MED ORDER — POTASSIUM CHLORIDE 10 MEQ/100ML IV SOLN
10.0000 meq | Freq: Once | INTRAVENOUS | Status: AC
  Administered 2018-05-08: 10 meq via INTRAVENOUS
  Filled 2018-05-08: qty 100

## 2018-05-08 MED ORDER — IOHEXOL 300 MG/ML  SOLN
100.0000 mL | Freq: Once | INTRAMUSCULAR | Status: AC | PRN
  Administered 2018-05-08: 100 mL via INTRAVENOUS

## 2018-05-08 MED ORDER — SODIUM CHLORIDE (PF) 0.9 % IJ SOLN
INTRAMUSCULAR | Status: AC
  Filled 2018-05-08: qty 50

## 2018-05-08 MED ORDER — ONDANSETRON HCL 4 MG/2ML IJ SOLN
4.0000 mg | Freq: Once | INTRAMUSCULAR | Status: AC
  Administered 2018-05-08: 4 mg via INTRAVENOUS
  Filled 2018-05-08: qty 2

## 2018-05-08 MED ORDER — POTASSIUM CHLORIDE CRYS ER 20 MEQ PO TBCR
40.0000 meq | EXTENDED_RELEASE_TABLET | Freq: Once | ORAL | Status: AC
  Administered 2018-05-08: 40 meq via ORAL
  Filled 2018-05-08: qty 2

## 2018-05-08 NOTE — ED Notes (Signed)
Pt has been educated about pure wick. Suction set to 87mmHg

## 2018-05-08 NOTE — ED Notes (Signed)
Date and time results received: 05/08/18 1929 (use smartphrase ".now" to insert current time)  Test: HGB Critical Value: 6.8  Name of Provider Notified: Eustaquio Maize  Orders Received? Or Actions Taken?: notified Margaux Venter of HGB 6.8.

## 2018-05-08 NOTE — ED Notes (Signed)
Lab called asking for a recollection on CBC, tube had clotted under their care.

## 2018-05-08 NOTE — Discharge Instructions (Addendum)
Continue tylenol and motrin for pain. Follow up with primary care doctor this week.

## 2018-05-08 NOTE — ED Triage Notes (Signed)
Per EMS, Pt was seen at [facility] last night for upper right quadrant pain. Pt returns today due to pain still being present. Noticed the pain after lifting a 55 pound dog bag of food saturday.

## 2018-05-08 NOTE — ED Notes (Signed)
Bed: [facility] Expected date:  Expected time:  Means of arrival:  Comments: 

## 2018-05-08 NOTE — ED Notes (Signed)
Pt stated that she needed to use the restroom. I asked pt if she was able to stand or sit to get in a wheel chair or steady. She stated that she was in so much pain she would rather not move. NT explained to her that I would be able to place a pure wick as soon as room 10 is cleaned and we can place her in there. I asked if she was able to hold it for a few min and pt stated that it was fine.

## 2018-05-08 NOTE — ED Provider Notes (Signed)
[facility] DEPT Provider Note   CSN: [id number] Arrival date & time: 05/08/18  1508    History   Chief Complaint Chief Complaint  Patient presents with  . Back Pain  . Abdominal Pain    HPI MANMEET ARZOLA is a 48 y.o. female with PMHx RA, OA, Fibromyalgia who presents to the ED complaining of sudden onset, constant, waxing and waning, sharp, 9/10, right flank and RUQ abdominal pain x 1 day. Pt reports that she was baking banana bread yesterday when the pain suddenly hit her. She came to the ED via EMS yesterday and was given fentanyl en route which she believes made her appear less in pain than she actually was. Pt was given IM Toradol and Robaxin in the ED and sent home with diagnosis of back pain/MSK spasm. Pt was told to take her Flexeril which she already had at home and to follow up with her PCP if symptoms persist. She reports she was up all night with worsening pain and unable to be controlled with Flexeril. The pain began to radiate to her RUQ during the night. She called her PCP this morning who told her to return to the ED for further evaluation. Denies fever, chills, vomiting, chest pain, shortness of breath, diarrhea, constipation, urinary symptoms, or vaginal symptoms. Reports that her urine feels "warmer" than normal but denies any frequency, urgency, dysuria, hematuria.        Past Medical History:  Diagnosis Date  . Anxiety   . Arthritis    KNEES ELBOWS HIPS SHOULDER FINGERS  . Asthma   . Back pain   . Bipolar disorder ([place])   . Depression    BIPOLAR  . Dyspnea   . Fibromyalgia   . GERD (gastroesophageal reflux disease)   . Headache(784.0)    MIGRAINES  . Hyperlipidemia   . Memory difficulties 06/21/2014  . Neck pain   . OA (osteoarthritis) of knee   . Obesity   . 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  . Seizures ([place])    FACIAL SEIZURES LAST 4 DAYS AGO    Patient Active Problem List   Diagnosis Date Noted  . Spondylosis  of lumbar region without myelopathy or radiculopathy 06/25/2015  . Fibromyalgia 02/03/2015  . Tendinitis of both rotator cuffs 02/03/2015  . 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 Insomnia secondary to chronic pain 11/13/2014  . Fatigue due to sleep pattern disturbance 11/13/2014  . Memory difficulties 06/21/2014  . 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  . PTSD (post-traumatic stress disorder) 12/13/2013  . Bipolar depression ([place]) 12/12/2013  . PERICARDIAL EFFUSION 12/23/2008  . PSEUDOTUMOR CEREBRI 12/05/2008  . UNSPECIFIED PLEURAL EFFUSION 12/05/2008  . DYSPNEA 12/05/2008  . OBESITY 11/19/2008  . Migraine 11/19/2008  . ASTHMA 11/19/2008  . OTHER SPECIFIED DISORDER OF STOMACH AND DUODENUM 03/11/2008    Past Surgical History:  Procedure Laterality Date  . CARDIAC CATHETERIZATION  2010  . DILATION AND CURETTAGE OF UTERUS  1994  . LAPAROSCOPY N/A 11/21/2012   Procedure: LAPAROSCOPY OPERATIVE REMOVAL OF RIGHT TUBE AND OVARY AND FLUID FROM MASS;  Surgeon: Melina Schools, MD;  Location: [facility];  Service: Gynecology;  Laterality: N/A;  1 1/2hrs OR time  . OOPHORECTOMY       OB History   No obstetric history on file.      Home Medications    Prior to Admission medications   Medication Sig Start Date End Date Taking? Authorizing Provider  buPROPion (WELLBUTRIN XL) 150 MG 24  hr tablet Take 150 mg by mouth daily.   Yes [provider]  buPROPion (WELLBUTRIN XL) 300 MG 24 hr tablet Take 300 mg by mouth daily.   Yes [provider]  cyclobenzaprine (FLEXERIL) 10 MG tablet Take 1 tablet (10 mg total) by mouth every 6 (six) hours as needed for muscle spasms. 03/07/17  Yes Kathrynn Ducking, MD  valACYclovir (VALTREX) 500 MG tablet Take 500 mg by mouth 2 (two) times daily.   Yes [provider]  buPROPion (WELLBUTRIN) 100 MG tablet 1 tablet twice a day Orally 30 day(s) 08/02/16   [provider]  dicyclomine (BENTYL) 20 MG tablet Take 20 mg by  mouth daily as needed for spasms.  06/17/16   [provider]  folic acid (FOLVITE) 1 MG tablet Take 1 mg by mouth daily.     [provider]  LINZESS 145 MCG CAPS capsule Take 1 capsule by mouth daily with first meal for 30 days 07/13/16   [provider]  methotrexate (RHEUMATREX) 2.5 MG tablet Take 25 mg by mouth once a week. Takes 5 tablets twice daily only on Saturday. 09/09/14   [provider]  pregabalin (LYRICA) 200 MG capsule Take 1 capsule (200 mg total) by mouth 2 (two) times daily. Patient not taking: Reported on 11/02/2017 08/30/16   Kathrynn Ducking, MD  triamcinolone cream (KENALOG) 0.1 % Apply 1 application topically 2 (two) times daily. Apply to the affected area. Patient not taking: Reported on 11/02/2017 08/29/16   Doristine Devoid, PA-C    Family History Family History  Problem Relation Age of Onset  . Cancer - Lung Father   . Diabetes Father   . Cirrhosis Mother   . Migraines Sister   . Stroke Sister   . Healthy Brother   . Healthy Sister   . Healthy Sister   . Healthy Sister   . Healthy Brother   . Healthy Brother   . Healthy Brother   . Healthy Brother   . Healthy Brother   . Heart disease Maternal Grandmother   . Alzheimer's disease Maternal Grandfather   . Cancer - Lung Paternal Grandmother   . Breast cancer Neg Hx     Social History Social History   Tobacco Use  . Smoking status: Former Smoker    Last attempt to quit: 02/16/2003    Years since quitting: 15.2  . Smokeless tobacco: Never Used  Substance Use Topics  . Alcohol use: No    Alcohol/week: 0.0 standard drinks    Comment: occasionally  . Drug use: No     Allergies   Carbamazepine and Nsaids   Review of Systems Review of Systems  Constitutional: Negative for chills and fever.  Eyes: Negative for redness.  Respiratory: Negative for cough and shortness of breath.   Cardiovascular: Negative for chest pain.  Gastrointestinal: Positive for abdominal  pain and nausea. Negative for constipation, diarrhea and vomiting.  Genitourinary: Positive for flank pain. Negative for dysuria, frequency, hematuria, vaginal bleeding and vaginal pain.  Musculoskeletal: Positive for back pain.  Skin: Negative for rash.  Allergic/Immunologic: Positive for immunocompromised state (on methotrexate).  Neurological: Negative for headaches.     Physical Exam Updated Vital Signs BP 121/83   Pulse 84   Temp 97.9 F (36.6 C) (Oral)   Resp 15   Ht 5\' 8"  (1.727 m)   Wt 92 kg   SpO2 100%   BMI 30.84 kg/m   Physical Exam Vitals signs and nursing  note reviewed.  Constitutional:      Appearance: She is ill-appearing.  HENT:     Head: Normocephalic and atraumatic.     Mouth/Throat:     Mouth: Mucous membranes are moist.  Eyes:     Conjunctiva/sclera: Conjunctivae normal.  Neck:     Musculoskeletal: Neck supple.  Cardiovascular:     Rate and Rhythm: Normal rate and regular rhythm.     Pulses: Normal pulses.  Pulmonary:     Effort: Pulmonary effort is normal.     Breath sounds: Normal breath sounds. No wheezing or rales.  Abdominal:     Palpations: Abdomen is soft.     Tenderness: There is abdominal tenderness in the right upper quadrant. There is right CVA tenderness. There is no left CVA tenderness, guarding or rebound. Negative signs include Murphy's sign.  Skin:    General: Skin is warm and dry.     Comments: No ecchymosis appreciated over right flank or abdomen  Neurological:     Mental Status: She is alert.      ED Treatments / Results  Labs (all labs ordered are listed, but only abnormal results are displayed) Labs Reviewed  COMPREHENSIVE METABOLIC PANEL - Abnormal; Notable for the following components:      Result Value   Potassium 2.9 (*)    Chloride 118 (*)    CO2 19 (*)    Calcium 7.7 (*)    Total Protein 6.0 (*)    Albumin 3.4 (*)    All other components within normal limits  URINALYSIS, ROUTINE W REFLEX MICROSCOPIC -  Abnormal; Notable for the following components:   Color, Urine STRAW (*)    pH 9.0 (*)    All other components within normal limits  CBC WITH DIFFERENTIAL/PLATELET - Abnormal; Notable for the following components:   WBC 2.7 (*)    RBC 2.41 (*)    Hemoglobin 6.8 (*)    HCT 22.7 (*)    Platelets 133 (*)    Neutro Abs 1.1 (*)    All other components within normal limits  CBC WITH DIFFERENTIAL/PLATELET - Abnormal; Notable for the following components:   WBC 3.6 (*)    All other components within normal limits  LIPASE, BLOOD  CBC WITH DIFFERENTIAL/PLATELET  OCCULT BLOOD X 1 CARD TO LAB, STOOL  POC OCCULT BLOOD, ED  TYPE AND SCREEN  ABO/RH    EKG EKG Interpretation  Date/Time:  Monday May 08 2018 18:33:26 EDT Ventricular Rate:  58 PR Interval:    QRS Duration: 94 QT Interval:  445 QTC Calculation: 438 R Axis:   81 Text Interpretation:  Sinus rhythm Confirmed by Lennice Sites [phone]) on 05/08/2018 6:49:33 PM   Radiology Ct Abdomen Pelvis W Contrast  Result Date: 05/08/2018 CLINICAL DATA:  Right flank and right upper quadrant pain. Patient reports pain onset after lifting 55 pound dog food back. EXAM: CT ABDOMEN AND PELVIS WITH CONTRAST TECHNIQUE: Multidetector CT imaging of the abdomen and pelvis was performed using the standard protocol following bolus administration of intravenous contrast. CONTRAST:  134mL OMNIPAQUE IOHEXOL 300 MG/ML  SOLN COMPARISON:  CT 05/14/2009 FINDINGS: Lower chest: Scattered dependent atelectasis. No confluent airspace disease or pleural fluid. Hepatobiliary: Subcentimeter hypodensity in the left lobe of the liver is too small to accurately characterize. Gallbladder physiologically distended, no calcified stone. No biliary dilatation. Pancreas: No ductal dilatation or inflammation. Spleen: Normal in size without focal abnormality. Adrenals/Urinary Tract: Normal adrenal glands. Mild symmetric prominence of bilateral renal collecting systems likely  secondary  to distended urinary bladder. There is homogeneous enhancement with symmetric excretion on delayed phase imaging. No urolithiasis. No perinephric edema. The urinary bladder is distended. Bladder volume of 12.1 x 11.4 x 14.6 cm (volume = 1050 cm^3). No bladder wall thickening or perivesicular edema. Stomach/Bowel: Scattered intraluminal like density within the stomach and small bowel consistent with ingested material. Stomach is within normal limits. Appendix appears normal, for example image 57 series 2. No evidence of bowel wall thickening, distention, or inflammatory changes. Moderate volume of stool throughout the entire colon. Mild colonic tortuosity. No colonic inflammation. Vascular/Lymphatic: Normal caliber abdominal aorta. No acute vascular findings. Prominent left gonadal vein at 10 mm but no significant reflux of contrast. No enlarged abdominal or pelvic lymph nodes. Reproductive: Uterus displaced posteriorly by bladder distension, no gross abnormality. No obvious adnexal mass. Adnexal structures are not well assessed. Other: Trace free fluid in the pelvis. No other free fluid. No free air or intra-abdominal abscess. Musculoskeletal: There are no acute or suspicious osseous abnormalities. Osteitis pubis as before. 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 No evidence of abdominal wall or muscular hematoma. Ileus psoas muscles are symmetric and unremarkable. IMPRESSION: 1. No acute abnormality or explanation for right sided abdominal pain. 2. Distended urinary bladder and bilateral renal collecting systems. No bladder wall thickening or CT findings of urinary tract infection. 3. Moderate colonic stool burden with colonic tortuosity, suggesting constipation. Electronically Signed   By: Keith Rake M.D.   On: 05/08/2018 19:57    Procedures Procedures (including critical care time)  Medications Ordered in ED Medications  sodium chloride (PF) 0.9 % injection (has no administration in time range)   sodium chloride 0.9 % bolus 1,000 mL (0 mLs Intravenous Stopped 05/08/18 1825)  ondansetron (ZOFRAN) injection 4 mg (4 mg Intravenous Given 05/08/18 1703)  morphine 2 MG/ML injection 2 mg (2 mg Intravenous Given 05/08/18 1703)  potassium chloride SA (K-DUR,KLOR-CON) CR tablet 40 mEq (40 mEq Oral Given 05/08/18 1827)  potassium chloride 10 mEq in 100 mL IVPB (0 mEq Intravenous Stopped 05/08/18 2004)  morphine 2 MG/ML injection 2 mg (2 mg Intravenous Given 05/08/18 1935)  iohexol (OMNIPAQUE) 300 MG/ML solution 100 mL (100 mLs Intravenous Contrast Given 05/08/18 1915)     Initial Impression / Assessment and Plan / ED Course  I have reviewed the triage vital signs and the nursing notes.  Pertinent labs & imaging results that were available during my care of the patient were reviewed by me and considered in my medical decision making (see chart for details).   Bounce back pt, seen yesterday for right upper back pain; did not have RUQ pain at that time but does now. Was given fentanyl en route yesterday with relief of pain; also given IM Toradol and Robaxin and sent home with diagnosis of back pain/muscle spasm. Pt reports worsening pain throughout the night with radiation to RUQ. No nausea, vomiting, fever, chills, chest pain, SOB, urinary symptoms, or vaginal complaints. PSHx includes bilateral oopherectomy. No other abdominal surgeries. Diffusely tender to RUQ and right flank; will get CT A/P which will show kidney stone vs gallbladder abnormality. If gallbladder looks inflammed on CT/no obvious findings on CT will get RUQ ultrasound. Pt given 2 mg Morphine for pain, zofran, and IV fluids. Baseline labs including CBC, CMP, lipase, and U/A. Will reevaluate after labs return.   6:06 PM Pt hypokalemic at 2.9; will replete with 40 mg KDUR and 10 mEq IV potassium once. Still awaiting remainder of labs as  well at CT abdomen. Per nursing note; CBC hemolyzed. U/A negative as well. On reeval; pt states her pain has  mildly subsided. She does not want anymore pain medicine at this time; advised that I will recheck on her within the next hour to see if she would like anything more for pain or she can use her call bell to ask for more pain medicine.   7:36 PM Nursing staff informed of critical value, hemoglobin 6.8, WBC 2.7, and platelets 133; pancytopenic although unsure what is causing this. Pt denies any hematochezia, melena, hematemesis, hemoptysis. She states she does not look in her toilet bowl to discern if stool is darker in color. Last H&H 6 months ago was normal. Will get hemoccult. Pt;s vital signs stable; still having pain; another 2 mg morphine ordered. Still awaiting CT results.   8:53 PM CT with no acute findings. Hemoccult negative. Will get repeat CBC to ensure pancytopenia is not a lab error. Discussed case with attending physician Dr. Ronnald Nian who is in agreement of repeat CBC prior to likely admission with heme onc to see pt in the morning. Discussed findings with pt; she is in agreement with plan at this time. Reports her pain is much improved from 2nd dose of morphine.   9:50 PM Repeat CBC with normal H&H and platelets; likely lab error earlier. All other workup negative. Pt likely experiencing back pain, will have her follow up with PCP in the morning regarding workup. Advised pt to take Flexeril as needed for pain. Pt in agreement with plan and stable for discharge at this time.   CBC Latest Ref Rng & Units 05/08/2018 05/08/2018 10/13/2017  WBC 4.0 - 10.5 K/uL 3.6(L) 2.7(L) 4.8  Hemoglobin 12.0 - 15.0 g/dL 12.1 6.8(LL) 12.2  Hematocrit 36.0 - 46.0 % 38.9 22.7(L) 39.5  Platelets 150 - 400 K/uL 295 133(L) 270          Final Clinical Impressions(s) / ED Diagnoses   Final diagnoses:  Low back pain, unspecified back pain laterality, unspecified chronicity, unspecified whether sciatica present    ED Discharge Orders    None       Eustaquio Maize, PA-C 05/08/18 2214    Lennice Sites, DO 05/09/18 1420

## 2018-05-09 LAB — ABO/RH: ABO/RH(D): O POS

## 2018-11-15 ENCOUNTER — Other Ambulatory Visit: Payer: Self-pay | Admitting: Obstetrics and Gynecology

## 2018-11-21 ENCOUNTER — Other Ambulatory Visit: Payer: Self-pay | Admitting: Obstetrics and Gynecology

## 2018-11-21 DIAGNOSIS — N6489 Other specified disorders of breast: Secondary | ICD-10-CM

## 2018-11-23 ENCOUNTER — Ambulatory Visit: Payer: Medicare Other

## 2018-11-23 ENCOUNTER — Other Ambulatory Visit: Payer: Self-pay

## 2018-11-23 ENCOUNTER — Ambulatory Visit
Admission: RE | Admit: 2018-11-23 | Discharge: 2018-11-23 | Disposition: A | Discharge status: Home / Self Care | Payer: Medicare Other | Source: Ambulatory Visit | Attending: Obstetrics and Gynecology | Admitting: Obstetrics and Gynecology

## 2018-11-23 DIAGNOSIS — N6489 Other specified disorders of breast: Secondary | ICD-10-CM

## 2018-12-12 ENCOUNTER — Emergency Department
Admission: EM | Admit: 2018-12-12 | Discharge: 2018-12-12 | Discharge status: Against Medical Advice | Payer: Medicare Other

## 2018-12-12 ENCOUNTER — Other Ambulatory Visit: Payer: Self-pay

## 2018-12-12 NOTE — ED Notes (Signed)
Pt called with no response, not visualized in the [place]. Will reattempt in 41min

## 2018-12-12 NOTE — ED Notes (Signed)
Pt called with no response

## 2018-12-14 ENCOUNTER — Encounter (HOSPITAL_COMMUNITY): Payer: Self-pay | Admitting: Emergency Medicine

## 2018-12-14 ENCOUNTER — Observation Stay (HOSPITAL_COMMUNITY)
Admission: RE | Admit: 2018-12-14 | Discharge: 2018-12-15 | Disposition: A | Discharge status: Other Acute Inpatient Hospital | Payer: Medicare Other | Attending: Psychiatry | Admitting: Psychiatry

## 2018-12-14 ENCOUNTER — Other Ambulatory Visit: Payer: Self-pay

## 2018-12-14 DIAGNOSIS — Z79899 Other long term (current) drug therapy: Secondary | ICD-10-CM | POA: Insufficient documentation

## 2018-12-14 DIAGNOSIS — E669 Obesity, unspecified: Secondary | ICD-10-CM | POA: Diagnosis not present

## 2018-12-14 DIAGNOSIS — F4310 Post-traumatic stress disorder, unspecified: Secondary | ICD-10-CM | POA: Diagnosis not present

## 2018-12-14 DIAGNOSIS — E785 Hyperlipidemia, unspecified: Secondary | ICD-10-CM | POA: Diagnosis not present

## 2018-12-14 DIAGNOSIS — F3132 Bipolar disorder, current episode depressed, moderate: Secondary | ICD-10-CM | POA: Diagnosis not present

## 2018-12-14 DIAGNOSIS — G43909 Migraine, unspecified, not intractable, without status migrainosus: Secondary | ICD-10-CM | POA: Diagnosis not present

## 2018-12-14 DIAGNOSIS — Z20828 Contact with and (suspected) exposure to other viral communicable diseases: Secondary | ICD-10-CM | POA: Insufficient documentation

## 2018-12-14 DIAGNOSIS — K219 Gastro-esophageal reflux disease without esophagitis: Secondary | ICD-10-CM | POA: Diagnosis not present

## 2018-12-14 DIAGNOSIS — R45851 Suicidal ideations: Principal | ICD-10-CM | POA: Insufficient documentation

## 2018-12-14 DIAGNOSIS — M797 Fibromyalgia: Secondary | ICD-10-CM | POA: Diagnosis not present

## 2018-12-14 DIAGNOSIS — J45909 Unspecified asthma, uncomplicated: Secondary | ICD-10-CM | POA: Diagnosis not present

## 2018-12-14 DIAGNOSIS — Z87891 Personal history of nicotine dependence: Secondary | ICD-10-CM | POA: Diagnosis not present

## 2018-12-14 DIAGNOSIS — M069 Rheumatoid arthritis, unspecified: Secondary | ICD-10-CM | POA: Diagnosis not present

## 2018-12-14 LAB — LIPID PANEL
Cholesterol: 200 mg/dL (ref 0–200)
HDL: 58 mg/dL (ref >40)
LDL Cholesterol: 131 mg/dL — ABNORMAL HIGH (ref 0–99)
Total CHOL/HDL Ratio: 3.4 RATIO
Triglycerides: 57 mg/dL (ref <150)
VLDL: 11 mg/dL (ref 0–40)

## 2018-12-14 LAB — URINALYSIS, COMPLETE (UACMP) WITH MICROSCOPIC
Bilirubin Urine: NEGATIVE
Glucose, UA: NEGATIVE mg/dL
Ketones, ur: NEGATIVE mg/dL
Nitrite: NEGATIVE
Protein, ur: NEGATIVE mg/dL
Specific Gravity, Urine: 1.003 — ABNORMAL LOW (ref 1.005–1.030)
pH: 8 (ref 5.0–8.0)

## 2018-12-14 LAB — COMPREHENSIVE METABOLIC PANEL
ALT: 16 U/L (ref 0–44)
AST: 19 U/L (ref 15–41)
Albumin: 4.1 g/dL (ref 3.5–5.0)
Alkaline Phosphatase: 96 U/L (ref 38–126)
Anion gap: 8 (ref 5–15)
BUN: 8 mg/dL (ref 6–20)
CO2: 26 mmol/L (ref 22–32)
Calcium: 9.6 mg/dL (ref 8.9–10.3)
Chloride: 104 mmol/L (ref 98–111)
Creatinine, Ser: 0.94 mg/dL (ref 0.44–1.00)
GFR calc Af Amer: >60 mL/min (ref >60)
GFR calc non Af Amer: >60 mL/min (ref >60)
Glucose, Bld: 92 mg/dL (ref 70–99)
Potassium: 3.6 mmol/L (ref 3.5–5.1)
Sodium: 138 mmol/L (ref 135–145)
Total Bilirubin: 0.6 mg/dL (ref 0.3–1.2)
Total Protein: 7.6 g/dL (ref 6.5–8.1)

## 2018-12-14 LAB — RAPID URINE DRUG SCREEN, HOSP PERFORMED
Amphetamines: NOT DETECTED
Barbiturates: NOT DETECTED
Benzodiazepines: NOT DETECTED
Cocaine: NOT DETECTED
Opiates: NOT DETECTED
Tetrahydrocannabinol: NOT DETECTED

## 2018-12-14 LAB — HEPATIC FUNCTION PANEL
ALT: 17 U/L (ref 0–44)
AST: 19 U/L (ref 15–41)
Albumin: 4.1 g/dL (ref 3.5–5.0)
Alkaline Phosphatase: 94 U/L (ref 38–126)
Bilirubin, Direct: 0.1 mg/dL (ref 0.0–0.2)
Indirect Bilirubin: 0.7 mg/dL (ref 0.3–0.9)
Total Bilirubin: 0.8 mg/dL (ref 0.3–1.2)
Total Protein: 7.7 g/dL (ref 6.5–8.1)

## 2018-12-14 LAB — ETHANOL: Alcohol, Ethyl (B): <10 mg/dL (ref <10)

## 2018-12-14 LAB — HEMOGLOBIN A1C
Hgb A1c MFr Bld: 6 % — ABNORMAL HIGH (ref 4.8–5.6)
Mean Plasma Glucose: 125.5 mg/dL

## 2018-12-14 LAB — MAGNESIUM: Magnesium: 2.2 mg/dL (ref 1.7–2.4)

## 2018-12-14 LAB — TSH: TSH: 0.5 u[IU]/mL (ref 0.350–4.500)

## 2018-12-14 LAB — SARS CORONAVIRUS 2 BY RT PCR (HOSPITAL ORDER, PERFORMED IN ~~LOC~~ HOSPITAL LAB): SARS Coronavirus 2: NEGATIVE

## 2018-12-14 MED ORDER — ACETAMINOPHEN 325 MG PO TABS: 650.0000 mg | ORAL_TABLET | Freq: Four times a day (QID) | ORAL | Status: DC | PRN

## 2018-12-14 MED ORDER — MAGNESIUM HYDROXIDE 400 MG/5ML PO SUSP: 30.0000 mL | Freq: Every day | ORAL | Status: DC | PRN

## 2018-12-14 MED ORDER — HYDROXYZINE HCL 25 MG PO TABS: 25.0000 mg | ORAL_TABLET | Freq: Three times a day (TID) | ORAL | Status: DC | PRN

## 2018-12-14 MED ORDER — TRAZODONE HCL 50 MG PO TABS: 50.0000 mg | ORAL_TABLET | Freq: Every evening | ORAL | Status: DC | PRN

## 2018-12-14 MED ORDER — ALUM & MAG HYDROXIDE-SIMETH 200-200-20 MG/5ML PO SUSP: 30.0000 mL | ORAL | Status: DC | PRN

## 2018-12-14 NOTE — Care Management (Signed)
Under review [place], [facility],  [place], [place], [place], [place], [place], , [place], [place],  [place], [employer], [place],
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
## 2018-12-14 NOTE — [facility] Suicide Risk Assessment (Signed)
Suicide Risk Assessment  Discharge Assessment   [facility] Discharge Suicide Risk Assessment   Principal Problem: Bipolar 1 disorder, depressed, moderate ([place]) Discharge Diagnoses: Principal Problem:   Bipolar 1 disorder, depressed, moderate ([place])   Total Time spent with patient: 30 minutes  Musculoskeletal: Strength & Muscle Tone: within normal limits Gait & Station: normal Patient leans: N/A  Psychiatric Specialty Exam:   Blood pressure (!) 122/94, pulse 90, temperature 98.6 F (37 C), temperature source Oral, resp. rate 16, SpO2 100 %.There is no height or weight on file to calculate BMI.  General Appearance: Casual  Eye Contact::  Fair  Speech:  Clear and Coherent409  Volume:  Normal  Mood:  Anxious and Depressed  Affect:  Labile  Thought Process:  Coherent and Descriptions of Associations: Intact  Orientation:  Full (Time, Place, and Person)  Thought Content:  Logical and Rumination  Suicidal Thoughts:  No  Homicidal Thoughts:  No  Memory:  Immediate;   Good Recent;   Good Remote;   Good  Judgement:  Fair  Insight:  Fair  Psychomotor Activity:  Normal  Concentration:  Fair  Recall:  [place] of Knowledge:Good  Language: Good  Akathisia:  No  Handed:  Right  AIMS (if indicated):     Assets:  Communication Skills Desire for Improvement Financial Resources/Insurance Housing Social Support  Sleep:     Cognition: WNL  ADL's:  Intact   Mental Status Per Nursing Assessment::   On Admission:  NA  Demographic Factors:  NA  Loss Factors: NA  Historical Factors: NA  Risk Reduction Factors:   Living with another person, especially a relative  Continued Clinical Symptoms:  Depression:   Severe  Cognitive Features That Contribute To Risk:  None    Suicide Risk:  Moderate:  Frequent suicidal ideation with limited intensity, and duration, some specificity in terms of plans, no associated intent, good self-control, limited dysphoria/symptomatology, some risk  factors present, and identifiable protective factors, including available and accessible social support.    Plan Of Care/Follow-up recommendations:  Other:  Inpatient treatment recommended, patient prefers not to be admitted to [place], [place] 12/14/2018, 11:24 AM

## 2018-12-14 NOTE — Progress Notes (Signed)
[place] NOVEL CORONAVIRUS (COVID-19) DAILY CHECK-OFF SYMPTOMS - answer yes or no to each - every day NO YES  Have you had a fever in the past 24 hours?  . Fever (Temp > 37.80C / 100F) X   Have you had any of these symptoms in the past 24 hours? . New Cough .  Sore Throat  .  Shortness of Breath .  Difficulty Breathing .  Unexplained Body Aches   X   Have you had any one of these symptoms in the past 24 hours not related to allergies?   . Runny Nose .  Nasal Congestion .  Sneezing   X   If you have had runny nose, nasal congestion, sneezing in the past 24 hours, has it worsened?  X   EXPOSURES - check yes or no X   Have you traveled outside the state in the past 14 days?  X   Have you been in contact with someone with a confirmed diagnosis of COVID-19 or PUI in the past 14 days without wearing appropriate PPE?  X   Have you been living in the same home as a person with confirmed diagnosis of COVID-19 or a PUI (household contact)?    X   Have you been diagnosed with COVID-19?    X              What to do next: Answered NO to all: Answered YES to anything:   Proceed with unit schedule Follow the [facility] Inpatient Flowsheet.   
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
## 2018-12-14 NOTE — Progress Notes (Signed)
Patient ID: Brittney [name], female   DOB: September 06, 1970, 48 y.o.   MRN: [id number] Pt A&O x 4, resting at present. Denies SI, HI or AVH.  Remains guarded.  Monitoring for safety.

## 2018-12-14 NOTE — Progress Notes (Signed)
Patient ID: Brittney [name], female   DOB: [birth year], 48 y.o.   MRN: [id number] Pt A&O x 4, presents under IVC, by son for SI, plan to drink alcohol and take medications.  Pt denies.  Pt reported to TTS, she is upset about 76 yr old daughter having breast cancer and a Mastectomy.  Pt reports the doctors have found a spot on the daughters lung.  Pt sad tearful and depressed.  Denies HI or AVH.  Pt guarded, withdrawn, not forthcoming with information.  Refusing a COVID test, stating it is against her religion.  Monitoring for safety.

## 2018-12-14 NOTE — H&P (Signed)
Behavioral Health Medical Screening Exam  Per TTS :  Brittney [name] is an 48 y.o. female patient was brought to [facility] via Event organiser.  She is on IVC.  Family member had initiated IVC papers.  IVC papers indicate patient has made statements about wanting to drink all the ETOH in the house and overdose on medications in the house.  Patient denies this, saying she has no plan to kill herself and that she does not use ETOH or other drugs.  Patient says that she has thought that if her 36 year old daughter died, she may not want to continue living.  She said that this is a thought she has had off and on over the last 6 months.  Patient has a 38 year old daughter who has breast cancer and has had two masectomies (the last one was 10/22).  Patient says that on Tuesday (10/27) she was reading a book where a character had a daughter that died of breast cancer.  This triggered a lot of emotion in her.  Patient said that she then tried to call her two sons to talk to them.  She admits that she was very upset when she talked to one of her sons (the other she could not contact).  Pt said that she did make contact with her therapist Garfield Cornea at [place]) yesterday (10/28) and felt better.  Patient said that her sons visited her yesterday in the evening and she explained to them how she felt.  Patient says "I did not think the police were coming over because I was getting ready for bed when they came in."  Patient does not understand why she is at [facility] outside of the fact that her sons were over concerned.  Patient denies any HI or A/V hallucinations.  She said she has not been talking to people not there.  Patient does admit to depression and anxiety.  She has good eye contact.  Patient is apprehensive and her demeanor is congruent with affect.  Patient says that she is taking medication as directed.  Patient is oriented x4.  She is not responding to internal stimuli.  Her thought  process is logical and coherent.  Patient was at [facility] in 2015.  She has outpatient services through [facility].  Patient says she has an appt with her therapist there on 11/03.  Her last appt w/ therapist was 10/22.  Clinician talked with son.  He said that patient got very irate yesterday.  He said that Tuesday (10/27) patient had called him and said she was going to commit suicide.  Yesterday (10/28) patient had been talking about death and that she said something about wanting to drink ETOH to kill herself.  Patient had said she had a whole bunch of ETOH and a whole bunch of medicine and she should take it all "to make it stop."  Son said that he does not know what to do.  He said that patient started not making sense.  he was worried that patient was "really comfortable with everything being over."  Patient has had a hx of not taking medication according to son.  He said he wanted to make sure she was safe.  -Clinician talked with Talbot Grumbling, NP who said that patient's IVC papers need to be reviewed by psychiatry in the morning.  Patient was assigned to OBS 204.  Total Time spent with patient: 30 minutes  Psychiatric Specialty Exam:  Physical Exam  Constitutional: She is oriented to person, place, and time. She appears well-developed.  HENT:  Head: Normocephalic.  Eyes: Pupils are equal, round, and reactive to light.  Neck: Normal range of motion.  Musculoskeletal: Normal range of motion.  Neurological: She is alert and oriented to person, place, and time.  Skin: Skin is warm and dry.    Review of Systems  Psychiatric/Behavioral: Positive for depression. Negative for hallucinations and suicidal ideas.  All other systems reviewed and are negative.   Blood pressure (!) 122/94, pulse 90, temperature 98.6 F (37 C), temperature source Oral, resp. rate 16, SpO2 100 %.There is no height or weight on file to calculate BMI.  General Appearance: Casual  Eye Contact:   Good  Speech:  Normal Rate  Volume:  Normal  Mood:  Depressed  Affect:  Congruent and Depressed  Thought Process:  Descriptions of Associations: Intact  Orientation:  Full (Time, Place, and Person)  Thought Content:  WDL  Suicidal Thoughts:  No  Homicidal Thoughts:  No  Memory:  Recent;   Good  Judgement:  Good  Insight:  Good  Psychomotor Activity:  Normal  Concentration: Concentration: Good  Recall:  Good  Fund of Knowledge:Good  Language: Good  Akathisia:  No  Handed:  Right  AIMS (if indicated):     Assets:  Communication Skills Desire for Improvement Housing Social Support  Sleep:       Musculoskeletal: Strength & Muscle Tone: within normal limits Gait & Station: normal Patient leans: N/A  Blood pressure (!) 122/94, pulse 90, temperature 98.6 F (37 C), temperature source Oral, resp. rate 16, SpO2 100 %.  Recommendations:  Based on my evaluation the patient appears to have an emergency medical condition for which I recommend the patient be transferred to the emergency department for further evaluation.   Disposition: No evidence of imminent risk to self or others at present.   Patient does not meet criteria for psychiatric inpatient admission. Supportive therapy provided about ongoing stressors.  Mliss Fritz, NP 12/14/2018, 5:39 AM

## 2018-12-14 NOTE — Plan of Care (Signed)
[place] Observation Crisis Plan  Reason for Crisis Plan:  Chronic Mental Illness/Medical Illness   Plan of Care:  Referral for Inpatient Hospitalization  Family Support:      Current Living Environment:  Living Arrangements: Children(Has her 82year old daughter living with her.)  Insurance:   Hospital Account    Name Acct ID Class Status Primary Coverage   Brittney [name], Brittney [name] [id number] [facility] - MEDICARE PART A AND B        Guarantor Account (for Hospital Account [id number])    Name Relation to Pt Service Area Active? Acct Type   Brittney [name], Brittney [name]   Address Phone       [place], [place] [address] [phone])          Coverage Information (for Hospital Account [id number])    1. [place] PART A AND B    F/O Payor/Plan Precert #   MEDICARE/MEDICARE PART A AND B    Subscriber Subscriber #   Brittney [name], Brittney [name] [id number]   Address Phone   [address], [place] [address]        2. [facility] MEDICAID/[place]    F/O Payor/Plan Precert #   [facility] MEDICAID/[place] MEDICA[place]    Subscriber Subscriber #   Brittney [name], Brittney [name] [id number]   Address Phone   [address], [place] [address] [phone]          Legal Guardian:     Primary Care Provider:  Missick, Brittney Sciara, Brittney [name]  Current Outpatient Providers:  Brittney Abbot, Brittney [name]  Psychiatrist:  Name of Psychiatrist: [name], Brittney [name] at [facility]  Counselor/Therapist:  Name of Therapist: [name] at [facility] [employer] with Medications:  Yes  Additional Information:   [name] Lenett 10/29/20205:01 AM

## 2018-12-14 NOTE — Progress Notes (Signed)
Pt refused COVID test, stating it is against her religion.  House Coverage Supervisor Kim and NP Adaku aware, at bedside to speak with pt.  Pt advised to stay in room with mask on.

## 2018-12-15 NOTE — Progress Notes (Signed)
   12/15/18 0820  Psych Admission Type (Psych Patients Only)  Admission Status Involuntary  Psychosocial Assessment  Patient Complaints Sadness  Eye Contact Fair  Facial Expression Worried  Affect Appropriate to circumstance  Speech Logical/coherent  Interaction Assertive  Motor Activity Other (Comment) (WDL)  Appearance/Hygiene Unremarkable  Behavior Characteristics Cooperative  Mood Depressed;Anxious;Sad  Thought Process  Coherency WDL  Content WDL  Delusions None reported or observed  Perception WDL  Hallucination None reported or observed  Judgment WDL  Confusion None  Danger to Self  Current suicidal ideation? Denies  Danger to Others  Danger to Others None reported or observed   Pt is in her room reading the Bible. Pt reports that her 8 year old daughter recently had her second mastectomy and has been fighting breast caner for two years. The doctors have discovered a spot on her lungs and she is waiting on test results. Pt describes her daughter as uneducated and making poor choices concerning her medical treatment. Pt said that she herself had a bad day with her emotions concerning her daughter and she reached out for support. Pt has three sons and another 69 year old daughter. Pt denies si and hi.

## 2018-12-15 NOTE — Progress Notes (Signed)
Patient d/c'd to [facility] via Event organiser at this time. Patient alert, oriented and ambulatory at discharge.

## 2018-12-15 NOTE — Discharge Summary (Signed)
Physician Discharge Summary Note  Patient:  Brittney [name] is an 48 y.o., female MRN:  [id number] DOB:  1970-03-18 Patient phone:  [phone] (home)  Patient address:   [place] [id number],  Total Time spent with patient: 20 minutes  Date of Admission:  12/14/2018 Date of Discharge: 12/15/2018  Reason for Admission:  Suicidal Ideations with plan, IVC by family member  Principal Problem: Bipolar 1 disorder, depressed, moderate ([place]) Discharge Diagnoses: Principal Problem:   Bipolar 1 disorder, depressed, moderate ([place])   Past Psychiatric History: Bipolar I Disorder, PTSD  Past Medical History:  Past Medical History:  Diagnosis Date  . Anxiety   . Arthritis    KNEES ELBOWS HIPS SHOULDER FINGERS  . Asthma   . Back pain   . Bipolar disorder ([place])   . Depression    BIPOLAR  . Dyspnea   . Fibromyalgia   . GERD (gastroesophageal reflux disease)   . Headache(784.0)    MIGRAINES  . Hyperlipidemia   . Memory difficulties 06/21/2014  . Neck pain   . OA (osteoarthritis) of knee   . Obesity   . 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  . 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 CARDIAC CATHETERIZATION  2010  . DILATION AND CURETTAGE OF UTERUS  1994  . LAPAROSCOPY N/A 11/21/2012   Procedure: LAPAROSCOPY OPERATIVE REMOVAL OF RIGHT TUBE AND OVARY AND FLUID FROM MASS;  Surgeon: Melina Schools, MD;  Location: [facility];  Service: Gynecology;  Laterality: N/A;  1 1/2hrs OR time  . OOPHORECTOMY     Family History:  Family History  Problem Relation Age of Onset  . Cancer - Lung Father   . Diabetes Father   . Cirrhosis Mother   . Migraines Sister   . Stroke Sister   . Healthy Brother   . Healthy Sister   . Healthy Sister   . Healthy Sister   . Healthy Brother   . Healthy Brother   . Healthy Brother   . Healthy Brother   . Healthy Brother   . Heart disease Maternal Grandmother   .  Alzheimer's disease Maternal Grandfather   . Cancer - Lung Paternal Grandmother   . Breast cancer Daughter 43   Family Psychiatric  History: Unknown Social History:  Social History   Substance and Sexual Activity  Alcohol Use No  . Alcohol/week: 0.0 standard drinks   Comment: occasionally     Social History   Substance and Sexual Activity  Drug Use No    Social History   Socioeconomic History  . Marital status: Divorced    Spouse name: Not on file  . Number of children: 5  . Years of education: BA  . Highest education level: Not on file  Occupational History  . Not on file  Social Needs  . Financial resource strain: Not on file  . Food insecurity    Worry: Not on file    Inability: Not on file  . Transportation needs    Medical: Not on file    Non-medical: Not on file  Tobacco Use  . Smoking status: Former Smoker    Quit date: 02/16/2003    Years since quitting: 15.8  . Smokeless tobacco: Never Used  Substance and Sexual Activity  . Alcohol use: No    Alcohol/week: 0.0 standard drinks    Comment: occasionally  . Drug use: No  .  Sexual activity: Not Currently  Lifestyle  . Physical activity    Days per week: Not on file    Minutes per session: Not on file  . Stress: Not on file  Relationships  . Social Herbalist on phone: Not on file    Gets together: Not on file    Attends religious service: Not on file    Active member of club or organization: Not on file    Attends meetings of clubs or organizations: Not on file    Relationship status: Not on file  Other Topics Concern  . Not on file  Social History Narrative   Patient drinks 2 cups of caffeine daily.   Patient is right handed.    Hospital Course:  Patient involuntarily committed by family member after "feeling very sad for two or three days." Patient verbalizes, "I am sad because my daughter has cancer and she recently has a second mastectomy." Patient initially attempted to reach adult  sons, when she could not she contacted outpatient therapist. Patient admitted for observation, patient mood continued to be depressed. Patient focused on IVC, verbalizes "My life is over now, everything that I have ever worked for is gone, my life is ruined now."  Patient refuses any safety planning, verbalizes "I don't trust anyone, these men (sons) do not have any control over my life."   Physical Findings: AIMS: Facial and Oral Movements Muscles of Facial Expression: None, normal Lips and Perioral Area: None, normal Jaw: None, normal Tongue: None, normal,Extremity Movements Upper (arms, wrists, hands, fingers): None, normal Lower (legs, knees, ankles, toes): None, normal, Trunk Movements Neck, shoulders, hips: None, normal, Overall Severity Severity of abnormal movements (highest score from questions above): None, normal Incapacitation due to abnormal movements: None, normal Patient's awareness of abnormal movements (rate only patient's report): No Awareness, Dental Status Current problems with teeth and/or dentures?: No Does patient usually wear dentures?: No  CIWA:  CIWA-Ar Total: 3 COWS:  COWS Total Score: 3  Musculoskeletal: Strength & Muscle Tone: within normal limits Gait & Station: normal Patient leans: N/A  Psychiatric Specialty Exam: Physical Exam  Nursing note and vitals reviewed. Constitutional: She is oriented to person, place, and time. She appears well-developed.  HENT:  Head: Normocephalic.  Cardiovascular: Normal rate.  Respiratory: Effort normal.  Neurological: She is alert and oriented to person, place, and time.  Psychiatric: Her speech is normal and behavior is normal. Judgment normal. Cognition and memory are normal. She exhibits a depressed mood. She expresses suicidal ideation. She expresses suicidal plans.    Review of Systems  Constitutional: Negative.   HENT: Negative.   Eyes: Negative.   Respiratory: Negative.   Cardiovascular: Negative.    Gastrointestinal: Negative.   Genitourinary: Negative.   Musculoskeletal: Negative.   Skin: Negative.   Neurological: Negative.   Endo/Heme/Allergies: Negative.   Psychiatric/Behavioral: Positive for depression and suicidal ideas.    Blood pressure 118/84, pulse 70, temperature 97.7 F (36.5 C), temperature source Oral, resp. rate 16, SpO2 100 %.There is no height or weight on file to calculate BMI.  General Appearance: Casual  Eye Contact:  Fair  Speech:  Clear and Coherent and Normal Rate  Volume:  Normal  Mood:  Depressed  Affect:  Depressed  Thought Process:  Coherent and Descriptions of Associations: Intact  Orientation:  Full (Time, Place, and Person)  Thought Content:  Rumination  Suicidal Thoughts:  Yes.  with intent/plan  Homicidal Thoughts:  No  Memory:  Immediate;  Good Recent;   Good Remote;   Good  Judgement:  Fair  Insight:  Lacking  Psychomotor Activity:  Normal  Concentration:  Concentration: Good and Attention Span: Good  Recall:  Good  Fund of Knowledge:  Good  Language:  Good  Akathisia:  No  Handed:  Right  AIMS (if indicated):     Assets:  Communication Skills Desire for Improvement Financial Resources/Insurance Housing Social Support  ADL's:  Intact  Cognition:  WNL  Sleep:           Has this patient used any form of tobacco in the last 30 days? (Cigarettes, Smokeless Tobacco, Cigars, and/or Pipes) Yes, No  Blood Alcohol level:  Lab Results  Component Value Date   ETH <10 0000000    Metabolic Disorder Labs:  Lab Results  Component Value Date   HGBA1C 6.0 (H) 12/14/2018   MPG 125.5 12/14/2018   No results found for: PROLACTIN Lab Results  Component Value Date   CHOL 200 12/14/2018   TRIG 57 12/14/2018   HDL 58 12/14/2018   CHOLHDL 3.4 12/14/2018   VLDL 11 12/14/2018   LDLCALC 131 (H) 12/14/2018   LDLCALC 118 (H) 12/12/2013    See Psychiatric Specialty Exam and Suicide Risk Assessment completed by Attending Physician  prior to discharge.  Discharge destination:  Other:  [place]  Is patient on multiple antipsychotic therapies at discharge:  No   Has Patient had three or more failed trials of antipsychotic monotherapy by history:  No  Recommended Plan for Multiple Antipsychotic Therapies: NA   Allergies as of 12/15/2018      Reactions   Carbamazepine Rash   Nsaids Swelling    Pt says she can take ibuprofen      Medication List    STOP taking these medications   pregabalin 200 MG capsule Commonly known as: Lyrica   triamcinolone cream 0.1 % Commonly known as: KENALOG     TAKE these medications     Indication  buPROPion 150 MG 24 hr tablet Commonly known as: WELLBUTRIN XL Take 150 mg by mouth daily.  Indication: Major Depressive Disorder   buPROPion 300 MG 24 hr tablet Commonly known as: WELLBUTRIN XL Take 300 mg by mouth daily.  Indication: Major Depressive Disorder   cyclobenzaprine 10 MG tablet Commonly known as: FLEXERIL Take 1 tablet (10 mg total) by mouth every 6 (six) hours as needed for muscle spasms.  Indication: Muscle Spasm   Enbrel SureClick 50 MG/ML injection Generic drug: etanercept Inject 50 mg into the skin once a week.  Indication: Rheumatoid Arthritis   methotrexate 2.5 MG tablet Commonly known as: RHEUMATREX Take 25 mg by mouth once a week. Takes 5 tablets twice daily only on Saturday.  Indication: Rheumatoid Arthritis   omeprazole 20 MG capsule Commonly known as: PRILOSEC Take 20 mg by mouth daily.  Indication: Gastroesophageal Reflux Disease   valACYclovir 1000 MG tablet Commonly known as: VALTREX Take 500 mg by mouth 2 (two) times daily.  Indication: Herpes Simplex Infection        Follow-up recommendations:  Other:  Transfer to [facility]  Comments:  Discharge to [facility], patient IVC at this time.  Signed: Emmaline Kluver, FNP 12/15/2018, 11:31 AM

## 2018-12-15 NOTE — Progress Notes (Signed)
Report called to [facility] spoke with Conley Rolls RN receiving nurse.

## 2018-12-15 NOTE — Progress Notes (Addendum)
Pt accepted to [facility]; [facility] [place] [facility]  Dr. Romie Jumper is the accepting/attending provider.   Call report to [phone] Jan @ [facility] Obs notified.    Pt is involuntary and will be transported by law enforcement Pt may arrive as soon as transportation can be arranged.   Audree Camel, LCSW, [place] Disposition [facility]/TTS [phone]

## 2018-12-15 NOTE — Progress Notes (Signed)
D:Patient came to this writer and requested to have provider to come see her. Patient not happy with being admitted inpatient. Patient called her children and updated them on her being admitted inpatient. Patient son called and requested someone call him to answer questions about his mom. A: Provider Otila Kluver NP.notified of patient request to speak with her and provider also given son informations and updated he is requesting to speak with her. R:Per provider she will come and talk with patient. Patient updated that provider will be coming to speak with her. Patient also updated that transportation has been called to transport her to [facility]. Patient provided support and encouragement. Q 15 minute checks in progress and patient remains safe on unit.

## 2019-01-26 LAB — HM COLONOSCOPY

## 2019-01-29 ENCOUNTER — Ambulatory Visit: Payer: Medicare Other | Admitting: Podiatry

## 2019-05-17 ENCOUNTER — Ambulatory Visit (HOSPITAL_COMMUNITY)
Admission: EM | Admit: 2019-05-17 | Discharge: 2019-05-17 | Disposition: A | Discharge status: Home / Self Care | Payer: Medicare Other

## 2019-05-17 ENCOUNTER — Other Ambulatory Visit: Payer: Self-pay

## 2019-05-17 ENCOUNTER — Encounter (HOSPITAL_COMMUNITY): Payer: Self-pay | Admitting: Emergency Medicine

## 2019-05-17 ENCOUNTER — Ambulatory Visit (INDEPENDENT_AMBULATORY_CARE_PROVIDER_SITE_OTHER): Payer: Medicare Other

## 2019-05-17 DIAGNOSIS — M25552 Pain in left hip: Secondary | ICD-10-CM | POA: Diagnosis not present

## 2019-05-17 DIAGNOSIS — M542 Cervicalgia: Secondary | ICD-10-CM

## 2019-05-17 DIAGNOSIS — S40011A Contusion of right shoulder, initial encounter: Secondary | ICD-10-CM

## 2019-05-17 DIAGNOSIS — R296 Repeated falls: Secondary | ICD-10-CM

## 2019-05-17 DIAGNOSIS — S76012A Strain of muscle, fascia and tendon of left hip, initial encounter: Secondary | ICD-10-CM

## 2019-05-17 DIAGNOSIS — G8929 Other chronic pain: Secondary | ICD-10-CM

## 2019-05-17 DIAGNOSIS — M25511 Pain in right shoulder: Secondary | ICD-10-CM

## 2019-05-17 DIAGNOSIS — W19XXXA Unspecified fall, initial encounter: Secondary | ICD-10-CM

## 2019-05-17 MED ORDER — HYDROCODONE-ACETAMINOPHEN 5-325 MG PO TABS: 1.0000 | ORAL_TABLET | Freq: Four times a day (QID) | ORAL | 0 refills | Status: DC | PRN

## 2019-05-17 MED ORDER — ACETAMINOPHEN 325 MG PO TABS
ORAL_TABLET | ORAL | Status: AC
  Filled 2019-05-17: qty 2

## 2019-05-17 MED ORDER — ACETAMINOPHEN 325 MG PO TABS
650.0000 mg | ORAL_TABLET | Freq: Once | ORAL | Status: AC
  Administered 2019-05-17: 13:00:00 650 mg via ORAL

## 2019-05-17 NOTE — ED Triage Notes (Signed)
PT fell yesterday and today. She falls frequently. She believes she falls due to RA and shooting pain. She fell and struck her right shoulder this morning. Also has pain in left hip and right shin.

## 2019-05-17 NOTE — ED Provider Notes (Signed)
[facility]   MRN: [id number] DOB: 1970/05/29  Subjective:   Brittney [name] is a 49 y.o. female presenting for checkup at the request of her PCP.  Patient has a history of RA with intermittent and random low curving sharp pains that have been causing frequent falls.  Today, she reports 2 separate incidents.  Last night, states that she accidentally fell from feeling sharp pain in her ankles.  This fall happened in the kitchen, fell onto tile floor.  She states that she twisted awkwardly and ended up landing on her right side but also made impact on the way down with her left hip.  Today, she is having significant left hip pain, difficulty bearing weight initially but is able to walk on her own without assistance.  She also complains of right-sided neck trapezius and shoulder pain.  States that a rolled up carpet accidentally fell on that side while she was in her garage.  Denies head injury, trauma, loss of consciousness, confusion, dizziness.  She used Celebrex last night with some relief but her pain persisted this morning.  She has not taken anything for pain today.  Has a history of sensitivity with NSAIDs given chronic use of meloxicam in the past.  She does not tolerate any other NSAID very well.  Patient drove herself here without assistance.  No current facility-administered medications for this encounter.  Current Outpatient Medications:  .  buPROPion (WELLBUTRIN XL) 300 MG 24 hr tablet, Take 300 mg by mouth daily., Disp: , Rfl:  .  ENBREL SURECLICK 50 MG/ML injection, Inject 50 mg into the skin once a week., Disp: , Rfl:  .  folic acid (FOLVITE) A999333 MCG tablet, Take 400 mcg by mouth daily., Disp: , Rfl:  .  methotrexate (RHEUMATREX) 2.5 MG tablet, Take 25 mg by mouth once a week. Takes 5 tablets twice daily only on Saturday., Disp: , Rfl:  .  valACYclovir (VALTREX) 1000 MG tablet, Take 500 mg by mouth 2 (two) times daily., Disp: , Rfl:  .  buPROPion (WELLBUTRIN XL) 150 MG 24 hr  tablet, Take 150 mg by mouth daily., Disp: , Rfl:  .  cyclobenzaprine (FLEXERIL) 10 MG tablet, Take 1 tablet (10 mg total) by mouth every 6 (six) hours as needed for muscle spasms., Disp: 40 tablet, Rfl: 3 .  omeprazole (PRILOSEC) 20 MG capsule, Take 20 mg by mouth daily., Disp: , Rfl:    Allergies  Allergen Reactions  . Carbamazepine Rash  . Nsaids Swelling     Pt says she can take ibuprofen    Past Medical History:  Diagnosis Date  . Anxiety   . Arthritis    KNEES ELBOWS HIPS SHOULDER FINGERS  . Asthma   . Back pain   . Bipolar disorder ([place])   . Depression    BIPOLAR  . Dyspnea   . Fibromyalgia   . GERD (gastroesophageal reflux disease)   . Headache(784.0)    MIGRAINES  . Hyperlipidemia   . Memory difficulties 06/21/2014  . Neck pain   . OA (osteoarthritis) of knee   . Obesity   . 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  . Seizures ([place])    FACIAL SEIZURES LAST 4 DAYS AGO     Past Surgical History:  Procedure Laterality Date  . CARDIAC CATHETERIZATION  2010  . DILATION AND CURETTAGE OF UTERUS  1994  . LAPAROSCOPY N/A 11/21/2012   Procedure: LAPAROSCOPY OPERATIVE REMOVAL OF RIGHT TUBE AND OVARY AND FLUID FROM MASS;  Surgeon: Marcello Moores  Arlean Hopping, MD;  Location: [facility];  Service: Gynecology;  Laterality: N/A;  1 1/2hrs OR time  . OOPHORECTOMY      Family History  Problem Relation Age of Onset  . Cancer - Lung Father   . Diabetes Father   . Cirrhosis Mother   . Migraines Sister   . Stroke Sister   . Healthy Brother   . Healthy Sister   . Healthy Sister   . Healthy Sister   . Healthy Brother   . Healthy Brother   . Healthy Brother   . Healthy Brother   . Healthy Brother   . Heart disease Maternal Grandmother   . Alzheimer's disease Maternal Grandfather   . Cancer - Lung Paternal Grandmother   . Breast cancer Daughter 110    Social History   Tobacco Use  . Smoking status: Former Smoker    Quit date: 02/16/2003    Years since quitting: 16.2  . Smokeless  tobacco: Never Used  Substance Use Topics  . Alcohol use: No    Alcohol/week: 0.0 standard drinks    Comment: occasionally  . Drug use: No    ROS   Objective:   Vitals: BP 109/76   Pulse 81   Temp 98.6 F (37 C) (Oral)   Resp 16   SpO2 98%   Physical Exam Constitutional:      General: She is not in acute distress.    Appearance: Normal appearance. She is well-developed. She is obese. She is not ill-appearing, toxic-appearing or diaphoretic.  HENT:     Head: Normocephalic and atraumatic.     Nose: Nose normal.     Mouth/Throat:     Mouth: Mucous membranes are moist.     Pharynx: Oropharynx is clear.  Eyes:     General: No scleral icterus.    Extraocular Movements: Extraocular movements intact.     Pupils: Pupils are equal, round, and reactive to light.  Cardiovascular:     Rate and Rhythm: Normal rate.  Pulmonary:     Effort: Pulmonary effort is normal.  Musculoskeletal:     Right shoulder: Tenderness (movement pain but great ROM) present. No swelling, deformity, effusion, laceration, bony tenderness or crepitus. Normal range of motion. Normal strength.     Cervical back: Normal range of motion and neck supple. Spasms (trapezius) and tenderness (along areas outlined) present. No swelling, edema, deformity, erythema, signs of trauma, lacerations, rigidity, torticollis, bony tenderness or crepitus. Pain with movement present. Normal range of motion.       Back:     Left hip: Tenderness and bony tenderness present. No deformity, lacerations or crepitus. Decreased range of motion. Normal strength.  Skin:    General: Skin is warm and dry.  Neurological:     General: No focal deficit present.     Mental Status: She is alert and oriented to person, place, and time.     Cranial Nerves: No cranial nerve deficit.     Motor: Weakness (secondary to left hip pain) present.     Coordination: Coordination abnormal (secondary to left hip pain).     Gait: Gait abnormal (secondary  to left hip pain).     Deep Tendon Reflexes: Reflexes normal.  Psychiatric:        Mood and Affect: Mood normal.        Behavior: Behavior normal.        Thought Content: Thought content normal.        Judgment: Judgment normal.  DG Hip Unilat W or Wo Pelvis 2-3 Views Left  Result Date: 05/17/2019 CLINICAL DATA:  Left hip pain after fall last night EXAM: DG HIP (WITH OR WITHOUT PELVIS) 2-3V LEFT COMPARISON:  05/08/2018 CT abdomen/pelvis FINDINGS: There is no evidence of hip fracture or dislocation. There is no evidence of arthropathy or other focal bone abnormality. IMPRESSION: No left hip fracture or dislocation. Electronically Signed   By: Ilona Sorrel M.D.   On: 05/17/2019 13:08    Assessment and Plan :   1. Left hip pain   2. Accidental fall, initial encounter   3. Strain of left hip, initial encounter   4. Chronic pain of multiple joints   5. Acute pain of right shoulder   6. Neck pain on right side   7. Contusion of right shoulder, initial encounter   8. Frequent falls     Use conservative management for musculoskeletal type pain.  Patient is to schedule Celebrex, use hydrocodone for breakthrough pain.  Emphasized need for follow-up with her PCP especially given her history of frequent falls.  Counseled that she would benefit from having a home health aide and better care at home to avoid complications from her frequent falls such as a hip fracture or head injury.  Patient will follow up with PCP ASAP. Counseled patient on potential for adverse effects with medications prescribed/recommended today, ER and return-to-clinic precautions discussed, patient verbalized understanding.    Jaynee Eagles, PA-C 05/17/19 1328

## 2019-05-17 NOTE — Discharge Instructions (Signed)
Your x-ray was negative today.  Please use your Celebrex for pain control.  I am prescribing you oxycodone for severe pain but please limit this if Celebrex controls your pain.  Make sure you check back with your PCP.

## 2019-06-06 ENCOUNTER — Emergency Department (HOSPITAL_COMMUNITY)
Admission: EM | Admit: 2019-06-06 | Discharge: 2019-06-06 | Discharge status: Absent without leave | Payer: Medicare Other

## 2019-06-06 ENCOUNTER — Ambulatory Visit (HOSPITAL_COMMUNITY)
Admission: EM | Admit: 2019-06-06 | Discharge: 2019-06-06 | Disposition: A | Discharge status: Home / Self Care | Payer: Medicare Other | Attending: Internal Medicine | Admitting: Internal Medicine

## 2019-06-06 ENCOUNTER — Other Ambulatory Visit: Payer: Self-pay

## 2019-06-06 DIAGNOSIS — Z833 Family history of diabetes mellitus: Secondary | ICD-10-CM | POA: Insufficient documentation

## 2019-06-06 DIAGNOSIS — Z801 Family history of malignant neoplasm of trachea, bronchus and lung: Secondary | ICD-10-CM | POA: Insufficient documentation

## 2019-06-06 DIAGNOSIS — R6889 Other general symptoms and signs: Secondary | ICD-10-CM

## 2019-06-06 DIAGNOSIS — Z886 Allergy status to analgesic agent status: Secondary | ICD-10-CM | POA: Diagnosis not present

## 2019-06-06 DIAGNOSIS — M069 Rheumatoid arthritis, unspecified: Secondary | ICD-10-CM | POA: Insufficient documentation

## 2019-06-06 DIAGNOSIS — Z79899 Other long term (current) drug therapy: Secondary | ICD-10-CM | POA: Insufficient documentation

## 2019-06-06 DIAGNOSIS — R519 Headache, unspecified: Secondary | ICD-10-CM | POA: Diagnosis not present

## 2019-06-06 DIAGNOSIS — Z888 Allergy status to other drugs, medicaments and biological substances status: Secondary | ICD-10-CM | POA: Diagnosis not present

## 2019-06-06 DIAGNOSIS — Z803 Family history of malignant neoplasm of breast: Secondary | ICD-10-CM | POA: Diagnosis not present

## 2019-06-06 DIAGNOSIS — U071 COVID-19: Secondary | ICD-10-CM | POA: Insufficient documentation

## 2019-06-06 DIAGNOSIS — Z8249 Family history of ischemic heart disease and other diseases of the circulatory system: Secondary | ICD-10-CM | POA: Insufficient documentation

## 2019-06-06 DIAGNOSIS — G932 Benign intracranial hypertension: Secondary | ICD-10-CM | POA: Insufficient documentation

## 2019-06-06 DIAGNOSIS — Z87891 Personal history of nicotine dependence: Secondary | ICD-10-CM | POA: Insufficient documentation

## 2019-06-06 LAB — CBC WITH DIFFERENTIAL/PLATELET
Abs Immature Granulocytes: 0.01 K/uL (ref 0.00–0.07)
Basophils Absolute: 0 K/uL (ref 0.0–0.1)
Basophils Relative: 1 %
Eosinophils Absolute: 0 K/uL (ref 0.0–0.5)
Eosinophils Relative: 1 %
HCT: 39.2 % (ref 36.0–46.0)
Hemoglobin: 12.3 g/dL (ref 12.0–15.0)
Immature Granulocytes: 0 %
Lymphocytes Relative: 22 %
Lymphs Abs: 0.8 K/uL (ref 0.7–4.0)
MCH: 28.1 pg (ref 26.0–34.0)
MCHC: 31.4 g/dL (ref 30.0–36.0)
MCV: 89.5 fL (ref 80.0–100.0)
Monocytes Absolute: 0.4 K/uL (ref 0.1–1.0)
Monocytes Relative: 13 %
Neutro Abs: 2.1 K/uL (ref 1.7–7.7)
Neutrophils Relative %: 63 %
Platelets: 306 K/uL (ref 150–400)
RBC: 4.38 MIL/uL (ref 3.87–5.11)
RDW: 13.2 % (ref 11.5–15.5)
WBC: 3.4 K/uL — ABNORMAL LOW (ref 4.0–10.5)
nRBC: 0 % (ref 0.0–0.2)

## 2019-06-06 NOTE — ED Provider Notes (Signed)
[place]    CSN: [id number] Arrival date & time: 06/06/19  1528      History   Chief Complaint Chief Complaint  Patient presents with  . Covid Exposure    HPI Brittney [name] is a 49 y.o. female with a history of pseudotumor cerebri, rheumatoid arthritis and recently received [place] Covid vaccination within the last 13 days comes to urgent care with severe global headache, runny nose, sore throat and generalized body aches of 1 day duration.  Patient said symptoms started yesterday and has gotten worse.  No double vision, blurred vision, nausea or vomiting.  Patient went to [facility] last weekend and spent a few days there.  Patient has some nausea and nonbloody nonmucoid diarrhea since yesterday.  She denies any sick contacts.  No confusion or feeling fuzzy headed.  HPI  Past Medical History:  Diagnosis Date  . Anxiety   . Arthritis    KNEES ELBOWS HIPS SHOULDER FINGERS  . Asthma   . Back pain   . Bipolar disorder ([place])   . Depression    BIPOLAR  . Dyspnea   . Fibromyalgia   . GERD (gastroesophageal reflux disease)   . Headache(784.0)    MIGRAINES  . Hyperlipidemia   . Memory difficulties 06/21/2014  . Neck pain   . OA (osteoarthritis) of knee   . Obesity   . 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  . 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 Bipolar 1 disorder, depressed, moderate ([place]) 12/14/2018  . 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  . Fibromyalgia 02/03/2015  . Tendinitis of both rotator cuffs 02/03/2015  . 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 Insomnia secondary to chronic pain 11/13/2014  . Fatigue due to sleep pattern disturbance 11/13/2014  . Memory difficulties 06/21/2014  . 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  . PTSD (post-traumatic stress disorder) 12/13/2013  . Bipolar depression ([place])  12/12/2013  . PERICARDIAL EFFUSION 12/23/2008  . PSEUDOTUMOR CEREBRI 12/05/2008  . UNSPECIFIED PLEURAL EFFUSION 12/05/2008  . DYSPNEA 12/05/2008  . OBESITY 11/19/2008  . Migraine 11/19/2008  . ASTHMA 11/19/2008  . OTHER SPECIFIED DISORDER OF STOMACH AND DUODENUM 03/11/2008    Past Surgical History:  Procedure Laterality Date  . CARDIAC CATHETERIZATION  2010  . DILATION AND CURETTAGE OF UTERUS  1994  . LAPAROSCOPY N/A 11/21/2012   Procedure: LAPAROSCOPY OPERATIVE REMOVAL OF RIGHT TUBE AND OVARY AND FLUID FROM MASS;  Surgeon: Melina Schools, MD;  Location: [facility];  Service: Gynecology;  Laterality: N/A;  1 1/2hrs OR time  . OOPHORECTOMY      OB History   No obstetric history on file.      Home Medications    Prior to Admission medications   Medication Sig Start Date End Date Taking? Authorizing Provider  buPROPion (WELLBUTRIN XL) 150 MG 24 hr tablet Take 150 mg by mouth daily.    [provider]  buPROPion (WELLBUTRIN XL) 300 MG 24 hr tablet Take 300 mg by mouth daily.    [provider]  cyclobenzaprine (FLEXERIL) 10 MG tablet Take 1 tablet (10 mg total) by mouth every 6 (six) hours as needed for muscle spasms. 03/07/17   Kathrynn Ducking, MD  ENBREL SURECLICK 50 MG/ML injection Inject 50 mg into the skin once a week. 11/29/18   [provider]  folic acid (FOLVITE) [id number]  MCG tablet Take 400 mcg by mouth daily.    [provider]  HYDROcodone-acetaminophen (NORCO/VICODIN) 5-325 MG tablet Take 1 tablet by mouth every 6 (six) hours as needed for moderate pain or severe pain. 05/17/19   Jaynee Eagles, PA-C  methotrexate (RHEUMATREX) 2.5 MG tablet Take 25 mg by mouth once a week. Takes 5 tablets twice daily only on Saturday. 09/09/14   [provider]  omeprazole (PRILOSEC) 20 MG capsule Take 20 mg by mouth daily. 11/23/18   [provider]  valACYclovir (VALTREX) 1000 MG tablet Take 500 mg by mouth 2 (two) times daily. 10/21/18   [provider]    Family History Family History  Problem Relation Age of Onset  . Cancer - Lung Father   . Diabetes Father   . Cirrhosis Mother   . Migraines Sister   . Stroke Sister   . Healthy Brother   . Healthy Sister   . Healthy Sister   . Healthy Sister   . Healthy Brother   . Healthy Brother   . Healthy Brother   . Healthy Brother   . Healthy Brother   . Heart disease Maternal Grandmother   . Alzheimer's disease Maternal Grandfather   . Cancer - Lung Paternal Grandmother   . Breast cancer Daughter 26    Social History Social History   Tobacco Use  . Smoking status: Former Smoker    Quit date: 02/16/2003    Years since quitting: 16.3  . Smokeless tobacco: Never Used  Substance Use Topics  . Alcohol use: No    Alcohol/week: 0.0 standard drinks    Comment: occasionally  . Drug use: No     Allergies   Carbamazepine and Nsaids   Review of Systems Review of Systems  Constitutional: Positive for chills and fever. Negative for activity change, fatigue and unexpected weight change.  HENT: Positive for congestion, rhinorrhea and sore throat. Negative for postnasal drip, sinus pressure and sinus pain.   Respiratory: Negative for chest tightness, shortness of breath and wheezing.   Cardiovascular: Negative.   Gastrointestinal: Positive for diarrhea and nausea. Negative for abdominal pain and vomiting.  Genitourinary: Negative.   Neurological: Positive for headaches. Negative for dizziness, weakness and light-headedness.     Physical Exam Triage Vital Signs ED Triage Vitals [06/06/19 1546]  Enc Vitals Group     BP 121/67     Pulse Rate 86     Resp 16     Temp 99.5 F (37.5 C)     Temp src      SpO2 100 %     Weight      Height      Head Circumference      Peak Flow      Pain Score 7     Pain Loc      Pain Edu?      Excl. in [place]?    No data found.  Updated Vital Signs BP 121/67   Pulse 86   Temp 99.5 F (37.5 C)   Resp 16   SpO2 100%    Visual Acuity Right Eye Distance:   Left Eye Distance:   Bilateral Distance:    Right Eye Near:   Left Eye Near:    Bilateral Near:     Physical Exam Constitutional:      General: She is in acute distress.     Appearance: She is ill-appearing.  HENT:     Right Ear: Tympanic membrane normal.  Left Ear: Tympanic membrane normal.     Nose: Congestion present. No rhinorrhea.     Mouth/Throat:     Mouth: Mucous membranes are moist.     Pharynx: No oropharyngeal exudate or posterior oropharyngeal erythema.  Eyes:     Extraocular Movements: Extraocular movements intact.     Conjunctiva/sclera: Conjunctivae normal.  Cardiovascular:     Rate and Rhythm: Normal rate and regular rhythm.     Pulses: Normal pulses.     Heart sounds: Normal heart sounds. No murmur. No friction rub.  Pulmonary:     Effort: Pulmonary effort is normal. No respiratory distress.     Breath sounds: No stridor. No wheezing, rhonchi or rales.  Abdominal:     General: Bowel sounds are normal. There is no distension.     Palpations: There is no mass.  Musculoskeletal:        General: No swelling, deformity or signs of injury. Normal range of motion.     Cervical back: Normal range of motion. No rigidity or tenderness.  Lymphadenopathy:     Cervical: No cervical adenopathy.  Skin:    General: Skin is warm.     Capillary Refill: Capillary refill takes less than 2 seconds.  Neurological:     General: No focal deficit present.     Mental Status: She is alert and oriented to person, place, and time. Mental status is at baseline.     Cranial Nerves: No cranial nerve deficit.     Sensory: No sensory deficit.     Motor: No weakness.      UC Treatments / Results  Labs (all labs ordered are listed, but only abnormal results are displayed) Labs Reviewed  SARS CORONAVIRUS 2 (TAT 6-24 HRS)  CBC WITH DIFFERENTIAL/PLATELET    EKG   Radiology No results found.  Procedures Procedures (including  critical care time)  Medications Ordered in UC Medications - No data to display  Initial Impression / Assessment and Plan / UC Course  I have reviewed the triage vital signs and the nursing notes.  Pertinent labs & imaging results that were available during my care of the patient were reviewed by me and considered in my medical decision making (see chart for details).    1.  [place] symptoms: COVID-19 testing Patient is advised to quarantine until COVID-19 test results are available  2.  [place] vaccination in the last 2 weeks.  Patient presenting with headache which is severe is possibly secondary to viral illness.  I also have a concern for possible prothrombotic side effect of [employer] vaccine.  CBC has been drawn.  Patient is advised to go to emergency department for further evaluation.  Patient is agreeable. Final Clinical Impressions(s) / UC Diagnoses   Final diagnoses:  Flu-like symptoms  Severe headache   Discharge Instructions   None    ED Prescriptions    None     PDMP not reviewed this encounter.   Chase Picket, MD 06/06/19 [phone]

## 2019-06-06 NOTE — ED Notes (Signed)
Called for Pt to triage. No answer

## 2019-06-06 NOTE — ED Notes (Signed)
Pt prefer have vitals take in room due HIPAA concerns

## 2019-06-07 LAB — SARS CORONAVIRUS 2 (TAT 6-24 HRS): SARS Coronavirus 2: POSITIVE — AB

## 2019-06-08 ENCOUNTER — Telehealth: Payer: Self-pay | Admitting: Unknown Physician Specialty

## 2019-06-08 ENCOUNTER — Other Ambulatory Visit: Payer: Self-pay | Admitting: Physician Assistant

## 2019-06-08 ENCOUNTER — Ambulatory Visit (HOSPITAL_COMMUNITY)
Admission: RE | Admit: 2019-06-08 | Discharge: 2019-06-08 | Disposition: A | Discharge status: Home / Self Care | Payer: Medicare Other | Source: Ambulatory Visit | Attending: Pulmonary Disease | Admitting: Pulmonary Disease

## 2019-06-08 ENCOUNTER — Other Ambulatory Visit: Payer: Self-pay | Admitting: Unknown Physician Specialty

## 2019-06-08 DIAGNOSIS — M069 Rheumatoid arthritis, unspecified: Secondary | ICD-10-CM | POA: Diagnosis present

## 2019-06-08 DIAGNOSIS — U071 COVID-19: Secondary | ICD-10-CM | POA: Diagnosis not present

## 2019-06-08 DIAGNOSIS — Z23 Encounter for immunization: Secondary | ICD-10-CM | POA: Diagnosis not present

## 2019-06-08 MED ORDER — ALBUTEROL SULFATE HFA 108 (90 BASE) MCG/ACT IN AERS: 2.0000 | INHALATION_SPRAY | Freq: Once | RESPIRATORY_TRACT | Status: DC | PRN

## 2019-06-08 MED ORDER — ONDANSETRON HCL 4 MG/2ML IJ SOLN
4.0000 mg | Freq: Once | INTRAMUSCULAR | Status: AC
  Administered 2019-06-08: 4 mg via INTRAVENOUS
  Filled 2019-06-08: qty 2

## 2019-06-08 MED ORDER — EPINEPHRINE 0.3 MG/0.3ML IJ SOAJ: 0.3000 mg | Freq: Once | INTRAMUSCULAR | Status: DC | PRN

## 2019-06-08 MED ORDER — SODIUM CHLORIDE 0.9 % IV SOLN
Freq: Once | INTRAVENOUS | Status: AC
  Filled 2019-06-08: qty 700

## 2019-06-08 MED ORDER — SODIUM CHLORIDE 0.9 % IV SOLN: INTRAVENOUS | Status: DC | PRN

## 2019-06-08 MED ORDER — FAMOTIDINE IN NACL 20-0.9 MG/50ML-% IV SOLN: 20.0000 mg | Freq: Once | INTRAVENOUS | Status: DC | PRN

## 2019-06-08 MED ORDER — METHYLPREDNISOLONE SODIUM SUCC 125 MG IJ SOLR: 125.0000 mg | Freq: Once | INTRAMUSCULAR | Status: DC | PRN

## 2019-06-08 MED ORDER — DIPHENHYDRAMINE HCL 50 MG/ML IJ SOLN: 50.0000 mg | Freq: Once | INTRAMUSCULAR | Status: DC | PRN

## 2019-06-08 NOTE — Progress Notes (Signed)
Patient ID: Brittney [name], female   DOB: March 16, 1970, 49 y.o.   MRN: [id number] . Diagnosis: COVID-19  Physician:wright   Procedure: Covid Infusion Clinic Med: bamlanivimab\etesevimab infusion - Provided patient with bamlanimivab\etesevimab fact sheet for patients, parents and caregivers prior to infusion.  Complications: No immediate complications noted.  Discharge: Discharged home   Heide Scales 06/08/2019

## 2019-06-08 NOTE — Telephone Encounter (Signed)
  I connected by phone with Brittney [name] on 06/08/2019 at 8:50 AM to discuss the potential use of an new treatment for mild to moderate COVID-19 viral infection in non-hospitalized patients.  This patient is a 49 y.o. female that meets the FDA criteria for Emergency Use Authorization of bamlanivimab/etesevimab or casirivimab/imdevimab.  Has a (+) direct SARS-CoV-2 viral test result  Has mild or moderate COVID-19   Is ? 50 years of age and weighs ? 40 kg  Is NOT hospitalized due to COVID-19  Is NOT requiring oxygen therapy or requiring an increase in baseline oxygen flow rate due to COVID-19  Is within 10 days of symptom onset  Has at least one of the high risk factor(s) for progression to severe COVID-19 and/or hospitalization as defined in EUA.  Specific high risk criteria : Immunosuppressive Disease   I have spoken and communicated the following to the patient or parent/caregiver:  1. FDA has authorized the emergency use of bamlanivimab/etesevimab and casirivimab\imdevimab for the treatment of mild to moderate COVID-19 in adults and pediatric patients with positive results of direct SARS-CoV-2 viral testing who are 33 years of age and older weighing at least 40 kg, and who are at high risk for progressing to severe COVID-19 and/or hospitalization.  2. The significant known and potential risks and benefits of bamlanivimab/etesevimab and casirivimab\imdevimab, and the extent to which such potential risks and benefits are unknown.  3. Information on available alternative treatments and the risks and benefits of those alternatives, including clinical trials.  4. Patients treated with bamlanivimab/etesevimab and casirivimab\imdevimab should continue to self-isolate and use infection control measures (e.g., wear mask, isolate, social distance, avoid sharing personal items, clean and disinfect "high touch" surfaces, and frequent handwashing) according to CDC guidelines.   5. The patient or  parent/caregiver has the option to accept or refuse bamlanivimab/etesevimab or casirivimab\imdevimab .  After reviewing this information with the patient, The patient agreed to proceed with receiving the bamlanimivab infusion and will be provided a copy of the Fact sheet prior to receiving the infusion. Kathrine Haddock 06/08/2019 8:50 AM  Sx onset 4/21

## 2019-06-08 NOTE — Discharge Instructions (Signed)

## 2019-06-08 NOTE — Progress Notes (Signed)
C/o of nausea prior to antibody infusion - will administer zofran iv

## 2019-06-08 NOTE — Progress Notes (Signed)
Addendum: Pt is 2 weeks post J&J vaccine with fever, chills, body aches, and cough.  Discussed that the risks for patients who receive mab following vaccination have not been evaluated in clinical trials.

## 2019-08-13 ENCOUNTER — Other Ambulatory Visit: Payer: Self-pay

## 2019-08-13 ENCOUNTER — Ambulatory Visit (HOSPITAL_COMMUNITY)
Admission: EM | Admit: 2019-08-13 | Discharge: 2019-08-13 | Disposition: A | Discharge status: Home / Self Care | Payer: Medicaid Other

## 2019-09-20 ENCOUNTER — Ambulatory Visit: Payer: Medicare Other | Admitting: Podiatry

## 2019-09-25 ENCOUNTER — Other Ambulatory Visit: Payer: Self-pay | Admitting: Podiatry

## 2019-09-25 ENCOUNTER — Other Ambulatory Visit: Payer: Self-pay

## 2019-09-25 ENCOUNTER — Ambulatory Visit (INDEPENDENT_AMBULATORY_CARE_PROVIDER_SITE_OTHER): Payer: Medicare Other

## 2019-09-25 ENCOUNTER — Ambulatory Visit (INDEPENDENT_AMBULATORY_CARE_PROVIDER_SITE_OTHER): Payer: Medicare Other | Admitting: Podiatry

## 2019-09-25 DIAGNOSIS — M778 Other enthesopathies, not elsewhere classified: Secondary | ICD-10-CM

## 2019-09-25 DIAGNOSIS — M7751 Other enthesopathy of right foot: Secondary | ICD-10-CM | POA: Diagnosis not present

## 2019-09-25 DIAGNOSIS — M722 Plantar fascial fibromatosis: Secondary | ICD-10-CM

## 2019-09-25 DIAGNOSIS — G8929 Other chronic pain: Secondary | ICD-10-CM

## 2019-09-25 DIAGNOSIS — M7752 Other enthesopathy of left foot: Secondary | ICD-10-CM

## 2019-09-25 DIAGNOSIS — M79671 Pain in right foot: Secondary | ICD-10-CM

## 2019-10-03 NOTE — Progress Notes (Signed)
Subjective:   Patient ID: Brittney [name], female   DOB: 49 y.o.   MRN: [id number]   HPI 49 year old female presents the office today for concerns of pain to the right > left heel, plantar fasciitis.  She states this is on the past 2 to 3 years and she previously has seen orthopedics.  That point she was given exercises as well as steroid injection which did not help.  She then later followed up with a podiatrist and orthotics which helped some as well as physical therapy.  She states therapy was helpful but she discontinued is because of Covid and she actually is contracted and not going back to therapy at this time.  She denies any recent injury.  No radiating pain.  She does have a history of rheumatoid arthritis and she is on Celebrex currently.  She is not currently taking methotrexate or other medications for her rheumatoid.   Review of Systems  All other systems reviewed and are negative.  Past Medical History:  Diagnosis Date  . Anxiety   . Arthritis    KNEES ELBOWS HIPS SHOULDER FINGERS  . Asthma   . Back pain   . Bipolar disorder ([place])   . Depression    BIPOLAR  . Dyspnea   . Fibromyalgia   . GERD (gastroesophageal reflux disease)   . Headache(784.0)    MIGRAINES  . Hyperlipidemia   . Memory difficulties 06/21/2014  . Neck pain   . OA (osteoarthritis) of knee   . Obesity   . 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  . 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 CARDIAC CATHETERIZATION  2010  . DILATION AND CURETTAGE OF UTERUS  1994  . LAPAROSCOPY N/A 11/21/2012   Procedure: LAPAROSCOPY OPERATIVE REMOVAL OF RIGHT TUBE AND OVARY AND FLUID FROM MASS;  Surgeon: Melina Schools, MD;  Location: [facility];  Service: Gynecology;  Laterality: N/A;  1 1/2hrs OR time  . OOPHORECTOMY       Current Outpatient Medications:  .  buPROPion (WELLBUTRIN XL) 150 MG 24 hr tablet, Take 150 mg by mouth daily., Disp: , Rfl:  .   buPROPion (WELLBUTRIN XL) 300 MG 24 hr tablet, Take 300 mg by mouth daily., Disp: , Rfl:  .  cyclobenzaprine (FLEXERIL) 10 MG tablet, Take 1 tablet (10 mg total) by mouth every 6 (six) hours as needed for muscle spasms., Disp: 40 tablet, Rfl: 3 .  ENBREL SURECLICK 50 MG/ML injection, Inject 50 mg into the skin once a week., Disp: , Rfl:  .  folic acid (FOLVITE) 948 MCG tablet, Take 400 mcg by mouth daily., Disp: , Rfl:  .  HYDROcodone-acetaminophen (NORCO/VICODIN) 5-325 MG tablet, Take 1 tablet by mouth every 6 (six) hours as needed for moderate pain or severe pain., Disp: 15 tablet, Rfl: 0 .  methotrexate (RHEUMATREX) 2.5 MG tablet, Take 25 mg by mouth once a week. Takes 5 tablets twice daily only on Saturday., Disp: , Rfl:  .  omeprazole (PRILOSEC) 20 MG capsule, Take 20 mg by mouth daily., Disp: , Rfl:  .  valACYclovir (VALTREX) 1000 MG tablet, Take 500 mg by mouth 2 (two) times daily., Disp: , Rfl:   Allergies  Allergen Reactions  . Carbamazepine Rash  . Nsaids Swelling     Pt says she can take ibuprofen          Objective:  Physical Exam  General: AAO x3,  NAD  Dermatological: Skin is warm, dry and supple bilateral.  There are no open sores, no preulcerative lesions, no rash or signs of infection present.  Vascular: Dorsalis Pedis artery and Posterior Tibial artery pedal pulses are 2/4 bilateral with immedate capillary fill time.  There is no pain with calf compression, swelling, warmth, erythema.   Neruologic: Grossly intact via light touch bilateral.  Negative Tinel sign  Musculoskeletal: Tenderness to palpation along the plantar medial tubercle of the calcaneus at the insertion of plantar fascia on the right > leftfoot. There is no pain along the course of the plantar fascia within the arch of the foot. Plantar fascia appears to be intact. There is no pain with lateral compression of the calcaneus or pain with vibratory sensation. There is no pain along the course or insertion of  the achilles tendon. No other areas of tenderness to bilateral lower extremities.Muscular strength 5/5 in all groups tested bilateral.  Gait: Unassisted, Nonantalgic.       Assessment:   Bilateral plantar fasciitis right side worse than left     Plan:  -Treatment options discussed including all alternatives, risks, and complications -Etiology of symptoms were discussed -X-rays were obtained and reviewed with the patient.  No evidence of acute fracture or stress fracture -We discussed possible conservative versus surgical options.  After long discussion she is going to go back to physical therapy and also we will start EPAT.  Continue stretching, icing the as well as shoe modifications and orthotics.    Trula Slade DPM

## 2019-10-04 ENCOUNTER — Encounter: Payer: Medicaid Other | Admitting: *Deleted

## 2019-10-04 NOTE — Progress Notes (Signed)
This encounter was created in error - please disregard.

## 2019-10-04 NOTE — Patient Instructions (Signed)

## 2019-10-11 ENCOUNTER — Other Ambulatory Visit: Payer: Medicaid Other

## 2019-10-18 ENCOUNTER — Ambulatory Visit: Payer: Medicaid Other | Admitting: *Deleted

## 2019-11-12 ENCOUNTER — Ambulatory Visit (HOSPITAL_COMMUNITY): Admit: 2019-11-12 | Discharge status: Against Medical Advice | Payer: Medicaid Other

## 2019-11-13 ENCOUNTER — Ambulatory Visit (HOSPITAL_COMMUNITY): Payer: Self-pay

## 2019-12-12 ENCOUNTER — Encounter: Payer: Self-pay | Admitting: Neurology

## 2019-12-12 ENCOUNTER — Ambulatory Visit (INDEPENDENT_AMBULATORY_CARE_PROVIDER_SITE_OTHER): Payer: Medicare Other | Admitting: Neurology

## 2019-12-12 VITALS — BP 121/81 | HR 74 | Ht 68.0 in | Wt 215.0 lb

## 2019-12-12 DIAGNOSIS — G8929 Other chronic pain: Secondary | ICD-10-CM

## 2019-12-12 DIAGNOSIS — R413 Other amnesia: Secondary | ICD-10-CM | POA: Diagnosis not present

## 2019-12-12 DIAGNOSIS — G4489 Other headache syndrome: Secondary | ICD-10-CM

## 2019-12-12 DIAGNOSIS — M797 Fibromyalgia: Secondary | ICD-10-CM | POA: Diagnosis not present

## 2019-12-12 DIAGNOSIS — G4701 Insomnia due to medical condition: Secondary | ICD-10-CM

## 2019-12-12 HISTORY — DX: Other headache syndrome: G44.89

## 2019-12-12 NOTE — Progress Notes (Signed)
Reason for visit: Fibromyalgia, headache, memory problems  Referring physician: Dr. Jimmy Picket Brittney [name] is a 49 y.o. female  History of present illness:  Brittney [name] is a 49 year old right-handed black female with a history of fibromyalgia and rheumatoid arthritis.  The patient was seen through this office in 2016.  She returns today with very similar complaints.  The patient has a history of what was felt to be pseudotumor cerebri, but a work-up in 2016 indicated that she did not have this clinical syndrome.  She has had headaches off and on for many years, over the last 2 months her headaches have been daily in nature.  The headaches are all over the head and may be associated with some blurring of vision in the occasional nausea without vomiting.  She indicates that the headaches are usually better in the morning and worse as the day goes on.  She reports a chronic issue with cognitive dysfunction, she was complaining of some memory problems back in 2016.  A Mini-Mental Status Examination at that time was 30/30.  The patient apparently contracted the Covid virus in April 2021.  She has come off of all of her medications since that time.  She was on methotrexate and Enbrel for her rheumatoid arthritis, but she has not taken these medications in over 6 months.  The patient does try to stay active, she engages in yoga and water aerobics.  If she overdoes, she will have a "fibroflare".  She does have some joint pains in the hips and knees associated with a rheumatoid arthritis.  The patient reports some numbness and tingling in the fingers, she feels that the hands are weak.  She does have some balance issues, she may stumble or fall on occasion.  She denies issues controlling the bowels or the bladder.  She did have the diagnosis of irritable bowel syndrome but this improved with an alteration in her diet.  The patient was given Cymbalta and Lyrica for the fibromyalgia symptoms through her primary  doctor, but she never took these medications.  The patient is not clear that she wants to go on any medications at this time.  She reports that she may lose her train of thought at times when she is conversing with someone.  The patient is still working, she is able to manage her day-to-day affairs fairly well.  She comes to the office today for an evaluation.  Past Medical History:  Diagnosis Date  . Anxiety   . Arthritis    KNEES ELBOWS HIPS SHOULDER FINGERS  . Asthma   . Back pain   . Bipolar disorder ([place])   . Depression    BIPOLAR  . Dyspnea   . Fibromyalgia   . GERD (gastroesophageal reflux disease)   . Headache(784.0)    MIGRAINES  . Hyperlipidemia   . Memory difficulties 06/21/2014  . Neck pain   . OA (osteoarthritis) of knee   . Obesity   . 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  . Seizures ([place])    FACIAL SEIZURES LAST 4 DAYS AGO    Past Surgical History:  Procedure Laterality Date  . CARDIAC CATHETERIZATION  2010  . DILATION AND CURETTAGE OF UTERUS  1994  . LAPAROSCOPY N/A 11/21/2012   Procedure: LAPAROSCOPY OPERATIVE REMOVAL OF RIGHT TUBE AND OVARY AND FLUID FROM MASS;  Surgeon: Melina Schools, MD;  Location: [facility];  Service: Gynecology;  Laterality: N/A;  1 1/2hrs OR time  . OOPHORECTOMY  Family History  Problem Relation Age of Onset  . Cancer - Lung Father   . Diabetes Father   . Cirrhosis Mother   . Migraines Sister   . Stroke Sister   . Healthy Brother   . Healthy Sister   . Healthy Sister   . Healthy Sister   . Healthy Brother   . Healthy Brother   . Healthy Brother   . Healthy Brother   . Healthy Brother   . Heart disease Maternal Grandmother   . Alzheimer's disease Maternal Grandfather   . Cancer - Lung Paternal Grandmother   . Breast cancer Daughter 65    Social history:  reports that she quit smoking about 16 years ago. She has never used smokeless tobacco. She reports that she does not drink alcohol and does not use  drugs.  Medications:  Prior to Admission medications   Medication Sig Start Date End Date Taking? Authorizing Provider  celecoxib (CELEBREX) 100 MG capsule Take 200 mg by mouth daily. 12/03/19  Yes [provider]  valACYclovir (VALTREX) 1000 MG tablet Take 500 mg by mouth 2 (two) times daily. 10/21/18  Yes [provider]  buPROPion (WELLBUTRIN XL) 150 MG 24 hr tablet Take 150 mg by mouth daily. Patient not taking: Reported on 12/12/2019    [provider]  buPROPion (WELLBUTRIN XL) 300 MG 24 hr tablet Take 300 mg by mouth daily. Patient not taking: Reported on 12/12/2019    [provider]  cyclobenzaprine (FLEXERIL) 10 MG tablet Take 1 tablet (10 mg total) by mouth every 6 (six) hours as needed for muscle spasms. Patient not taking: Reported on 12/12/2019 03/07/17   Kathrynn Ducking, MD  ENBREL SURECLICK 50 MG/ML injection Inject 50 mg into the skin once a week. Patient not taking: Reported on 12/12/2019 11/29/18   [provider]  folic acid (FOLVITE) 106 MCG tablet Take 400 mcg by mouth daily. Patient not taking: Reported on 12/12/2019    [provider]  HYDROcodone-acetaminophen (NORCO/VICODIN) 5-325 MG tablet Take 1 tablet by mouth every 6 (six) hours as needed for moderate pain or severe pain. Patient not taking: Reported on 12/12/2019 05/17/19   Jaynee Eagles, PA-C  methotrexate (RHEUMATREX) 2.5 MG tablet Take 25 mg by mouth once a week. Takes 5 tablets twice daily only on Saturday. Patient not taking: Reported on 12/12/2019 09/09/14   [provider]  omeprazole (PRILOSEC) 20 MG capsule Take 20 mg by mouth daily. Patient not taking: Reported on 12/12/2019 11/23/18   [provider]      Allergies  Allergen Reactions  . Carbamazepine Rash  . Nsaids Swelling     Pt says she can take ibuprofen    ROS:  Out of a complete 14 system review of symptoms, the patient complains only of the following symptoms, and all  other reviewed systems are negative.  Joint pain, muscle pain Insomnia Anxiety, depression Headache Memory problems  Height 5\' 8"  (1.727 m), weight 215 lb (97.5 kg).  Physical Exam  General: The patient is alert and cooperative at the time of the examination.  The patient is moderately obese.  Eyes: Pupils are equal, round, and reactive to light. Discs are flat bilaterally.  Neck: The neck is supple, no carotid bruits are noted.  Respiratory: The respiratory examination is clear.  Cardiovascular: The cardiovascular examination reveals a regular rate and rhythm, no obvious murmurs or rubs are noted.  Neuromuscular: The patient has good range of movement of the cervical spine  and lumbar spine.  Skin: Extremities are without significant edema.  Neurologic Exam  Mental status: The patient is alert and oriented x 3 at the time of the examination. The patient has apparent normal recent and remote memory, with an apparently normal attention span and concentration ability.  Cranial nerves: Facial symmetry is present. There is good sensation of the face to pinprick and soft touch bilaterally, with exception that the patient has decreased pinprick sensation on the left forehead as compared to the right.  Vibration sensation is decreased on the left forehead as compared to the right, the patient splits midline with vibration sensation.  The strength of the facial muscles and the muscles to head turning and shoulder shrug are normal bilaterally. Speech is well enunciated, no aphasia or dysarthria is noted. Extraocular movements are full. Visual fields are full. The tongue is midline, and the patient has symmetric elevation of the soft palate. No obvious hearing deficits are noted.  Motor: The motor testing reveals 5 over 5 strength of all 4 extremities. Good symmetric motor tone is noted throughout.  Sensory: Sensory testing is intact to pinprick, soft touch, vibration sensation, and position  sense on all 4 extremities. No evidence of extinction is noted.  Coordination: Cerebellar testing reveals good finger-nose-finger and heel-to-shin bilaterally.  Gait and station: Gait is normal. Tandem gait is normal. Romberg is negative. No drift is seen.  Reflexes: Deep tendon reflexes are symmetric and normal bilaterally. Toes are downgoing bilaterally.   Assessment/Plan:  1.  History of fibromyalgia  2.  Rheumatoid arthritis  3.  Reports of memory disorder  4.  Chronic daily headache  The patient is having very similar symptoms today she was having 5 years ago.  The patient is concerned that the Covid infection has worsened her symptoms, but her current complaints are clearly predating the Covid infection.  The patient is currently not on any medications, she does not wish to start the Lyrica or the Cymbalta.  She is worried about weight gain.  She is to continue low-grade exercise, I recommended an herbal treatment such as melatonin at night for sleep and Feverfew as an anti-inflammatory.  I am not clear that I can otherwise help her.  The patient can return to this office if needed.  If she is concerned about her cognitive issues worsening over time, we can get neuropsychological testing.  Jill Alexanders MD 12/12/2019 7:22 AM  [facility] [address] [place], [place] [address]  Phone [phone] Fax [phone]

## 2020-01-30 ENCOUNTER — Other Ambulatory Visit: Payer: Self-pay

## 2020-01-30 ENCOUNTER — Emergency Department (HOSPITAL_COMMUNITY)
Admission: EM | Admit: 2020-01-30 | Discharge: 2020-01-30 | Disposition: A | Discharge status: Home / Self Care | Payer: Medicare Other | Attending: Emergency Medicine | Admitting: Emergency Medicine

## 2020-01-30 DIAGNOSIS — R064 Hyperventilation: Secondary | ICD-10-CM | POA: Insufficient documentation

## 2020-01-30 DIAGNOSIS — Z5321 Procedure and treatment not carried out due to patient leaving prior to being seen by health care provider: Secondary | ICD-10-CM | POA: Diagnosis not present

## 2020-01-30 DIAGNOSIS — R202 Paresthesia of skin: Secondary | ICD-10-CM | POA: Insufficient documentation

## 2020-01-30 DIAGNOSIS — R079 Chest pain, unspecified: Secondary | ICD-10-CM | POA: Diagnosis not present

## 2020-01-30 LAB — BASIC METABOLIC PANEL
Anion gap: 7 (ref 5–15)
BUN: 7 mg/dL (ref 6–20)
CO2: 27 mmol/L (ref 22–32)
Calcium: 9.9 mg/dL (ref 8.9–10.3)
Chloride: 105 mmol/L (ref 98–111)
Creatinine, Ser: 0.8 mg/dL (ref 0.44–1.00)
GFR, Estimated: >60 mL/min (ref >60)
Glucose, Bld: 101 mg/dL — ABNORMAL HIGH (ref 70–99)
Potassium: 3.7 mmol/L (ref 3.5–5.1)
Sodium: 139 mmol/L (ref 135–145)

## 2020-01-30 LAB — CBC
HCT: 40.9 % (ref 36.0–46.0)
Hemoglobin: 12.7 g/dL (ref 12.0–15.0)
MCH: 26.9 pg (ref 26.0–34.0)
MCHC: 31.1 g/dL (ref 30.0–36.0)
MCV: 86.7 fL (ref 80.0–100.0)
Platelets: 322 10*3/uL (ref 150–400)
RBC: 4.72 MIL/uL (ref 3.87–5.11)
RDW: 12.9 % (ref 11.5–15.5)
WBC: 5.2 10*3/uL (ref 4.0–10.5)
nRBC: 0 % (ref 0.0–0.2)

## 2020-01-30 LAB — TROPONIN I (HIGH SENSITIVITY): Troponin I (High Sensitivity): 3 ng/L (ref <18)

## 2020-01-30 NOTE — ED Triage Notes (Signed)
Pt via EMS, picked up from [facility] while waiting for prescriptions started hyperventilating, tingling in hands, and chest pain. 324 ASA given by [place]. Tingling has subsided, chest pain remains at 4/10.

## 2020-01-30 NOTE — ED Notes (Addendum)
Per [facility], the patient left the emergency room with family.

## 2020-01-31 ENCOUNTER — Ambulatory Visit (HOSPITAL_COMMUNITY)
Admission: EM | Admit: 2020-01-31 | Discharge: 2020-01-31 | Disposition: A | Discharge status: Home / Self Care | Payer: Medicare Other | Attending: Family Medicine | Admitting: Family Medicine

## 2020-01-31 ENCOUNTER — Ambulatory Visit (INDEPENDENT_AMBULATORY_CARE_PROVIDER_SITE_OTHER): Payer: Medicare Other

## 2020-01-31 ENCOUNTER — Encounter (HOSPITAL_COMMUNITY): Payer: Self-pay

## 2020-01-31 ENCOUNTER — Emergency Department (HOSPITAL_COMMUNITY)
Admission: EM | Admit: 2020-01-31 | Discharge: 2020-01-31 | Disposition: A | Discharge status: Home / Self Care | Payer: Medicare Other | Attending: Emergency Medicine | Admitting: Emergency Medicine

## 2020-01-31 ENCOUNTER — Emergency Department (HOSPITAL_COMMUNITY): Payer: Medicare Other

## 2020-01-31 VITALS — BP 118/73 | HR 67 | Temp 98.8°F | Resp 16

## 2020-01-31 DIAGNOSIS — R110 Nausea: Secondary | ICD-10-CM

## 2020-01-31 DIAGNOSIS — R42 Dizziness and giddiness: Secondary | ICD-10-CM | POA: Insufficient documentation

## 2020-01-31 DIAGNOSIS — R4020 Unspecified coma: Secondary | ICD-10-CM | POA: Diagnosis not present

## 2020-01-31 DIAGNOSIS — R0602 Shortness of breath: Secondary | ICD-10-CM | POA: Insufficient documentation

## 2020-01-31 DIAGNOSIS — J45909 Unspecified asthma, uncomplicated: Secondary | ICD-10-CM | POA: Diagnosis not present

## 2020-01-31 DIAGNOSIS — R079 Chest pain, unspecified: Secondary | ICD-10-CM

## 2020-01-31 DIAGNOSIS — R531 Weakness: Secondary | ICD-10-CM

## 2020-01-31 DIAGNOSIS — R55 Syncope and collapse: Secondary | ICD-10-CM | POA: Insufficient documentation

## 2020-01-31 DIAGNOSIS — Z87891 Personal history of nicotine dependence: Secondary | ICD-10-CM | POA: Diagnosis not present

## 2020-01-31 DIAGNOSIS — M069 Rheumatoid arthritis, unspecified: Secondary | ICD-10-CM

## 2020-01-31 LAB — BASIC METABOLIC PANEL
Anion gap: 12 (ref 5–15)
BUN: 11 mg/dL (ref 6–20)
CO2: 25 mmol/L (ref 22–32)
Calcium: 9.9 mg/dL (ref 8.9–10.3)
Chloride: 104 mmol/L (ref 98–111)
Creatinine, Ser: 0.96 mg/dL (ref 0.44–1.00)
GFR, Estimated: >60 mL/min (ref >60)
Glucose, Bld: 91 mg/dL (ref 70–99)
Potassium: 3.9 mmol/L (ref 3.5–5.1)
Sodium: 141 mmol/L (ref 135–145)

## 2020-01-31 LAB — HEPATIC FUNCTION PANEL
ALT: 20 U/L (ref 0–44)
AST: 23 U/L (ref 15–41)
Albumin: 4.5 g/dL (ref 3.5–5.0)
Alkaline Phosphatase: 91 U/L (ref 38–126)
Bilirubin, Direct: 0.1 mg/dL (ref 0.0–0.2)
Indirect Bilirubin: 0.6 mg/dL (ref 0.3–0.9)
Total Bilirubin: 0.7 mg/dL (ref 0.3–1.2)
Total Protein: 8 g/dL (ref 6.5–8.1)

## 2020-01-31 LAB — CBC
HCT: 41.9 % (ref 36.0–46.0)
Hemoglobin: 13.4 g/dL (ref 12.0–15.0)
MCH: 27.8 pg (ref 26.0–34.0)
MCHC: 32 g/dL (ref 30.0–36.0)
MCV: 86.9 fL (ref 80.0–100.0)
Platelets: 351 10*3/uL (ref 150–400)
RBC: 4.82 MIL/uL (ref 3.87–5.11)
RDW: 13.1 % (ref 11.5–15.5)
WBC: 5.6 10*3/uL (ref 4.0–10.5)
nRBC: 0 % (ref 0.0–0.2)

## 2020-01-31 LAB — I-STAT BETA HCG BLOOD, ED (MC, WL, AP ONLY): I-stat hCG, quantitative: <5 m[IU]/mL (ref <5)

## 2020-01-31 LAB — TROPONIN I (HIGH SENSITIVITY): Troponin I (High Sensitivity): <2 ng/L (ref <18)

## 2020-01-31 LAB — D-DIMER, QUANTITATIVE: D-Dimer, Quant: <0.27 ug/mL-FEU (ref 0.00–0.50)

## 2020-01-31 MED ORDER — ACETAMINOPHEN 325 MG PO TABS
650.0000 mg | ORAL_TABLET | Freq: Once | ORAL | Status: AC
  Administered 2020-01-31: 650 mg via ORAL
  Filled 2020-01-31: qty 2

## 2020-01-31 NOTE — Discharge Instructions (Signed)
Your chest x ray is normal You need additional follow up to evaluate your symptoms

## 2020-01-31 NOTE — ED Triage Notes (Signed)
Patient reports yesterday she had a syncopal episode at [facility] and had dizziness and nausea with central chest pain. Patient reports she had blood work and EKG done and an EKG yesterday. Patient then left after triage due to extended wait times and today went to urgent care as her RA is flaring up. Patient reports her PCP and Rheumatologist are in [place].

## 2020-01-31 NOTE — Discharge Instructions (Addendum)
The testing today did not show any acute problems with your heart or lungs.  It is safe to continue your usual treatment for rheumatoid flare.  The infection of the left upper eyelid appears normal at this time and not infected.  It is not necessary to take the antibiotic, unless you feel like you are still having pain or swelling in the area.

## 2020-01-31 NOTE — ED Provider Notes (Signed)
[facility]    CSN: [id number] Arrival date & time: 01/31/20  1248      History   Chief Complaint Chief Complaint  Patient presents with  . Chest Pain  . Nausea  . Dizziness    HPI Brittney [name] is a 49 y.o. female.   HPI   Patient is here for an appointment. She gives a history of having eye surgery yesterday.  In her doctor's office yesterday morning she had a stye I&D performed after eyedrops to numb her eye.  The mass was lanced and the purulence expressed with 2 Q-tips.  After this she went to [facility] to get a prescription filled.  While at [facility] she started to feel faint, leaned over the desk, and then doesn't remember anything else until she was riding in the ambulance on her way to the hospital.  She states that she is having chest pain.  She states she is having a general rheumatoid flare.  She is having hip pain.  She was registered at the emergency department, blood work and EKG were performed, and she was placed waiting in the lobby.  Patient states that it was very busy.  She states it was a long wait.  She states that she had concerned because she was by herself, and her pain was increasing over time.  She states that because of her "eye surgery" she couldn't even see well enough to get to the bathroom.  She decided to leave the emergency room prior to being evaluated by physician, and come to the urgent care center today to complete her work-up.  She states she has been in contact with her PCP and rheumatologist, I have told her that her chest pain may be due to rheumatoid disease in her chest. I explained to the patient that loss of consciousness, and chest pain or diagnoses that are not usually worked up in the urgent care center.  We do not have the ability to do appropriate testing, and even though chest x-ray is available she cannot be fully worked up for her loss of consciousness. When we tried to redirect the patient to the emergency department she  refused to leave the urgent care center At this time she is still having difficulty with her vision.  She is still having pain in her joints.  She is still having chest pain.  The chest pain waxes and wanes.  It is worse with activity and better with rest.  It hurts with deep breath.  She does not have a history of leg pain or DVT.  She is not currently taking her rheumatoid medication.  Past Medical History:  Diagnosis Date  . Anxiety   . Arthritis    KNEES ELBOWS HIPS SHOULDER FINGERS  . Asthma   . Back pain   . Bipolar disorder ([place])   . Depression    BIPOLAR  . Dyspnea   . Fibromyalgia   . GERD (gastroesophageal reflux disease)   . Headache syndrome 12/12/2019  . Headache(784.0)    MIGRAINES  . Hyperlipidemia   . Memory difficulties 06/21/2014  . Neck pain   . OA (osteoarthritis) of knee   . Obesity   . 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  . Seizures ([place])    FACIAL SEIZURES LAST 4 DAYS AGO    Patient Active Problem List   Diagnosis Date Noted  . Headache syndrome 12/12/2019  . Bipolar 1 disorder, depressed, moderate ([place]) 12/14/2018  . Spondylosis of lumbar region without myelopathy  or radiculopathy 06/25/2015  . Fibromyalgia 02/03/2015  . Tendinitis of both rotator cuffs 02/03/2015  . 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 Insomnia secondary to chronic pain 11/13/2014  . Fatigue due to sleep pattern disturbance 11/13/2014  . Memory difficulties 06/21/2014  . 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  . PTSD (post-traumatic stress disorder) 12/13/2013  . Bipolar depression ([place]) 12/12/2013  . PERICARDIAL EFFUSION 12/23/2008  . PSEUDOTUMOR CEREBRI 12/05/2008  . UNSPECIFIED PLEURAL EFFUSION 12/05/2008  . DYSPNEA 12/05/2008  . OBESITY 11/19/2008  . Migraine 11/19/2008  . ASTHMA 11/19/2008  . OTHER SPECIFIED DISORDER OF STOMACH AND DUODENUM 03/11/2008    Past Surgical History:  Procedure Laterality Date  . CARDIAC CATHETERIZATION  2010  . DILATION AND  CURETTAGE OF UTERUS  1994  . LAPAROSCOPY N/A 11/21/2012   Procedure: LAPAROSCOPY OPERATIVE REMOVAL OF RIGHT TUBE AND OVARY AND FLUID FROM MASS;  Surgeon: Melina Schools, MD;  Location: [facility];  Service: Gynecology;  Laterality: N/A;  1 1/2hrs OR time  . OOPHORECTOMY      OB History   No obstetric history on file.      Home Medications    Prior to Admission medications   Medication Sig Start Date End Date Taking? Authorizing Provider  celecoxib (CELEBREX) 100 MG capsule Take 200 mg by mouth daily. 12/03/19   [provider]  valACYclovir (VALTREX) 1000 MG tablet Take 500 mg by mouth 2 (two) times daily. 10/21/18   [provider]    Family History Family History  Problem Relation Age of Onset  . Cancer - Lung Father   . Diabetes Father   . Cirrhosis Mother   . Migraines Sister   . Stroke Sister   . Healthy Brother   . Healthy Sister   . Healthy Sister   . Healthy Sister   . Healthy Brother   . Healthy Brother   . Healthy Brother   . Healthy Brother   . Healthy Brother   . Heart disease Maternal Grandmother   . Alzheimer's disease Maternal Grandfather   . Cancer - Lung Paternal Grandmother   . Breast cancer Daughter 47    Social History Social History   Tobacco Use  . Smoking status: Former Smoker    Quit date: 02/16/2003    Years since quitting: 16.9  . Smokeless tobacco: Never Used  Vaping Use  . Vaping Use: Never used  Substance Use Topics  . Alcohol use: No    Alcohol/week: 0.0 standard drinks    Comment: occasionally  . Drug use: No     Allergies   Carbamazepine and Nsaids   Review of Systems Review of Systems See HPI  Physical Exam Triage Vital Signs ED Triage Vitals  Enc Vitals Group     BP 01/31/20 1310 118/73     Pulse Rate 01/31/20 1310 67     Resp 01/31/20 1310 16     Temp 01/31/20 1310 98.8 F (37.1 C)     Temp Source 01/31/20 1310 Oral     SpO2 01/31/20 1310 100 %     Weight --      Height --      Head  Circumference --      Peak Flow --      Pain Score 01/31/20 1312 8     Pain Loc --      Pain Edu? --      Excl. in [place]? --    No data found.  Updated Vital Signs BP  118/73 (BP Location: Right Arm)   Pulse 67   Temp 98.8 F (37.1 C) (Oral)   Resp 16   SpO2 100%       Physical Exam Constitutional:      General: She is not in acute distress.    Appearance: She is well-developed and well-nourished.     Comments: Appears uncomfortable.  Guarded movements.  Is wearing dark glasses.    HENT:     Head: Normocephalic and atraumatic.     Mouth/Throat:     Mouth: Oropharynx is clear and moist.  Eyes:     Conjunctiva/sclera: Conjunctivae normal.     Pupils: Pupils are equal, round, and reactive to light.     Comments: Upper lid of the left eye is mildly swollen as opposed to right.  No conjunctival injection.  Cardiovascular:     Rate and Rhythm: Normal rate and regular rhythm.     Heart sounds: Normal heart sounds.  Pulmonary:     Effort: Pulmonary effort is normal. No respiratory distress.     Breath sounds: Normal breath sounds.     Comments: Limits deep breath due to pain Abdominal:     Palpations: Abdomen is soft.  Musculoskeletal:        General: No edema.     Cervical back: Normal range of motion.  Skin:    General: Skin is warm and dry.  Neurological:     Mental Status: She is alert.      UC Treatments / Results  Labs (all labs ordered are listed, but only abnormal results are displayed) Labs Reviewed - No data to display  EKG   Radiology DG Chest 2 View  Result Date: 01/31/2020 CLINICAL DATA:  Chest pain. Syncopal episode. Dizziness. Nausea. EXAM: CHEST - 2 VIEW COMPARISON:  Earlier today at 1353 hour FINDINGS: The cardiomediastinal contours are normal. The lungs are clear. Pulmonary vasculature is normal. No consolidation, pleural effusion, or pneumothorax. Slight scoliotic curvature. IMPRESSION: No acute chest findings or change from radiographs earlier  today. Electronically Signed   By: Keith Rake M.D.   On: 01/31/2020 19:50   DG Chest 2 View  Result Date: 01/31/2020 CLINICAL DATA:  Chest pain, nausea and dizziness for 3 days increased with movement and activity, loss of consciousness EXAM: CHEST - 2 VIEW COMPARISON:  12/29/2013 FINDINGS: Normal heart size, mediastinal contours, and pulmonary vascularity. Lungs clear. No infiltrate, pleural effusion or pneumothorax. Minimal thoracolumbar scoliosis. IMPRESSION: No acute abnormalities. Electronically Signed   By: Lavonia Dana M.D.   On: 01/31/2020 14:03    Procedures Procedures (including critical care time)  Medications Ordered in UC Medications - No data to display  Initial Impression / Assessment and Plan / UC Course  I have reviewed the triage vital signs and the nursing notes.  Pertinent labs & imaging results that were available during my care of the patient were reviewed by me and considered in my medical decision making (see chart for details).     Patient is clearly uncomfortable.  She has chest pain.  I told her that I am unclear the cause of her chest pain.  I am also unable to fully diagnose why she had loss of consciousness yesterday.  I do believe she needs a higher level of care.  I recommended she follow-up in the emergency room Final Clinical Impressions(s) / UC Diagnoses   Final diagnoses:  LOC (loss of consciousness) ([place])  Chest pain, unspecified type  Weakness  Rheumatoid arthritis flare ([place])  Discharge Instructions     Your chest x ray is normal You need additional follow up to evaluate your symptoms    ED Prescriptions    None     PDMP not reviewed this encounter.   Raylene Everts, MD 01/31/20 2153

## 2020-01-31 NOTE — ED Provider Notes (Signed)
[facility] DEPT Provider Note   CSN: [id number] Arrival date & time: 01/31/20  1432     History Chief Complaint  Patient presents with  . Chest Pain    Brittney [name] is a 49 y.o. female.  HPI She presents for evaluation of ongoing chest pain for 3 days, both day and night.  The pain never really goes away.  She also has intermittent episodes of dizziness which have occurred during this time.  Yesterday she saw an ophthalmologist and had a hordeolum drained by gentle pressure using Q-tips.  There was no incision done.  She was prescribed an antibiotic which she picked up and started taking last night.  She also came to the emergency department yesterday, was triaged but never seen by a physician because she chose to leave.  Symptoms have continued despite starting the antibiotic medication.  She states that yesterday while she was picking up her medication, she had a brief episode of syncope, following which she was treated with aspirin, then transported to the ED.  She states that she is "currently in a RA flare."  She contacted her rheumatologist, who suggested she come to the ER to get her lungs checked.  She denies shortness of breath, leg pain, cough, focal weakness or paresthesia.  She reports flying to [place] last week, but denies leg pain or swelling.  No prior history of VTE.  There are no other known modifying factors.    Past Medical History:  Diagnosis Date  . Anxiety   . Arthritis    KNEES ELBOWS HIPS SHOULDER FINGERS  . Asthma   . Back pain   . Bipolar disorder ([place])   . Depression    BIPOLAR  . Dyspnea   . Fibromyalgia   . GERD (gastroesophageal reflux disease)   . Headache syndrome 12/12/2019  . Headache(784.0)    MIGRAINES  . Hyperlipidemia   . Memory difficulties 06/21/2014  . Neck pain   . OA (osteoarthritis) of knee   . Obesity   . 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  . 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 Headache syndrome 12/12/2019  . Bipolar 1 disorder, depressed, moderate ([place]) 12/14/2018  . 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  . Fibromyalgia 02/03/2015  . Tendinitis of both rotator cuffs 02/03/2015  . 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 Insomnia secondary to chronic pain 11/13/2014  . Fatigue due to sleep pattern disturbance 11/13/2014  . Memory difficulties 06/21/2014  . 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  . PTSD (post-traumatic stress disorder) 12/13/2013  . Bipolar depression ([place]) 12/12/2013  . PERICARDIAL EFFUSION 12/23/2008  . PSEUDOTUMOR CEREBRI 12/05/2008  . UNSPECIFIED PLEURAL EFFUSION 12/05/2008  . DYSPNEA 12/05/2008  . OBESITY 11/19/2008  . Migraine 11/19/2008  . ASTHMA 11/19/2008  . OTHER SPECIFIED DISORDER OF STOMACH AND DUODENUM 03/11/2008    Past Surgical History:  Procedure Laterality Date  . CARDIAC CATHETERIZATION  2010  . DILATION AND CURETTAGE OF UTERUS  1994  . LAPAROSCOPY N/A 11/21/2012   Procedure: LAPAROSCOPY OPERATIVE REMOVAL OF RIGHT TUBE AND OVARY AND FLUID FROM MASS;  Surgeon: Melina Schools, MD;  Location: [facility];  Service: Gynecology;  Laterality: N/A;  1 1/2hrs OR time  . OOPHORECTOMY       OB History   No obstetric history on file.     Family History  Problem  Relation Age of Onset  . Cancer - Lung Father   . Diabetes Father   . Cirrhosis Mother   . Migraines Sister   . Stroke Sister   . Healthy Brother   . Healthy Sister   . Healthy Sister   . Healthy Sister   . Healthy Brother   . Healthy Brother   . Healthy Brother   . Healthy Brother   . Healthy Brother   . Heart disease Maternal Grandmother   . Alzheimer's disease Maternal Grandfather   . Cancer - Lung Paternal Grandmother   . Breast cancer Daughter 65    Social History   Tobacco Use  . Smoking status: Former Smoker    Quit date: 02/16/2003    Years  since quitting: 16.9  . Smokeless tobacco: Never Used  Vaping Use  . Vaping Use: Never used  Substance Use Topics  . Alcohol use: No    Alcohol/week: 0.0 standard drinks    Comment: occasionally  . Drug use: No    Home Medications Prior to Admission medications   Medication Sig Start Date End Date Taking? Authorizing Provider  celecoxib (CELEBREX) 100 MG capsule Take 200 mg by mouth daily. 12/03/19   [provider]  valACYclovir (VALTREX) 1000 MG tablet Take 500 mg by mouth 2 (two) times daily. 10/21/18   [provider]    Allergies    Carbamazepine and Nsaids  Review of Systems   Review of Systems  All other systems reviewed and are negative.   Physical Exam Updated Vital Signs BP 135/82   Pulse (!) 58   Temp 98.3 F (36.8 C) (Oral)   Resp 15   Ht 5\' 8"  (1.727 m)   Wt 90.7 kg   SpO2 100%   BMI 30.41 kg/m   Physical Exam Vitals and nursing note reviewed.  Constitutional:      General: She is not in acute distress.    Appearance: She is well-developed and well-nourished. She is obese. She is not ill-appearing, toxic-appearing or diaphoretic.  HENT:     Head: Normocephalic and atraumatic.     Right Ear: External ear normal.     Left Ear: External ear normal.  Eyes:     Extraocular Movements: EOM normal.     Conjunctiva/sclera: Conjunctivae normal.     Pupils: Pupils are equal, round, and reactive to light.     Comments: No evidence of stye or generalizing of the left upper eyelid.  No abnormality of either eye.  Neck:     Trachea: Phonation normal.  Cardiovascular:     Rate and Rhythm: Normal rate and regular rhythm.     Heart sounds: Normal heart sounds.  Pulmonary:     Effort: Pulmonary effort is normal.     Breath sounds: Normal breath sounds.  Chest:     Chest wall: No bony tenderness.  Abdominal:     General: There is no distension.     Palpations: Abdomen is soft.     Tenderness: There is no abdominal tenderness.   Musculoskeletal:        General: No swelling, tenderness or deformity. Normal range of motion.     Cervical back: Normal range of motion and neck supple.  Skin:    General: Skin is warm, dry and intact.  Neurological:     Mental Status: She is alert and oriented to person, place, and time.     Cranial Nerves: No cranial nerve deficit.     Sensory: No  sensory deficit.     Motor: No abnormal muscle tone.     Coordination: Coordination normal.  Psychiatric:        Mood and Affect: Mood and affect and mood normal.        Behavior: Behavior normal.        Thought Content: Thought content normal.        Judgment: Judgment normal.     ED Results / Procedures / Treatments   Labs (all labs ordered are listed, but only abnormal results are displayed) Labs Reviewed  BASIC METABOLIC PANEL  CBC  HEPATIC FUNCTION PANEL  D-DIMER, QUANTITATIVE (NOT AT [facility])  I-STAT BETA HCG BLOOD, ED ([place], [facility], [facility] ONLY)  TROPONIN I (HIGH SENSITIVITY)  TROPONIN I (HIGH SENSITIVITY)    EKG EKG Interpretation  Date/Time:  Thursday January 31 2020 14:50:21 EST Ventricular Rate:  66 PR Interval:    QRS Duration: 92 QT Interval:  412 QTC Calculation: 432 R Axis:   89 Text Interpretation: Sinus rhythm RSR' in V1 or V2, probably normal variant ST elev, probable normal early repol pattern since last tracing no significant change Confirmed by Daleen Bo [phone]) on 01/31/2020 7:52:39 PM   Radiology DG Chest 2 View  Result Date: 01/31/2020 CLINICAL DATA:  Chest pain. Syncopal episode. Dizziness. Nausea. EXAM: CHEST - 2 VIEW COMPARISON:  Earlier today at 1353 hour FINDINGS: The cardiomediastinal contours are normal. The lungs are clear. Pulmonary vasculature is normal. No consolidation, pleural effusion, or pneumothorax. Slight scoliotic curvature. IMPRESSION: No acute chest findings or change from radiographs earlier today. Electronically Signed   By: Keith Rake M.D.   On: 01/31/2020 19:50   DG  Chest 2 View  Result Date: 01/31/2020 CLINICAL DATA:  Chest pain, nausea and dizziness for 3 days increased with movement and activity, loss of consciousness EXAM: CHEST - 2 VIEW COMPARISON:  12/29/2013 FINDINGS: Normal heart size, mediastinal contours, and pulmonary vascularity. Lungs clear. No infiltrate, pleural effusion or pneumothorax. Minimal thoracolumbar scoliosis. IMPRESSION: No acute abnormalities. Electronically Signed   By: Lavonia Dana M.D.   On: 01/31/2020 14:03    Procedures Procedures (including critical care time)  Medications Ordered in ED Medications  acetaminophen (TYLENOL) tablet 650 mg (650 mg Oral Given 01/31/20 1802)    ED Course  I have reviewed the triage vital signs and the nursing notes.  Pertinent labs & imaging results that were available during my care of the patient were reviewed by me and considered in my medical decision making (see chart for details).    MDM Rules/Calculators/A&P                           Patient Vitals for the past 24 hrs:  BP Temp Temp src Pulse Resp SpO2 Height Weight  01/31/20 1945 135/82 -- -- (!) 58 15 100 % -- --  01/31/20 1930 (!) 120/92 -- -- (!) 59 17 100 % -- --  01/31/20 1856 119/84 -- -- 60 (!) 21 100 % -- --  01/31/20 1714 125/90 -- -- (!) 49 13 99 % -- --  01/31/20 1640 125/90 -- -- (!) 56 16 100 % -- --  01/31/20 1450 (!) 143/92 98.3 F (36.8 C) Oral 77 18 100 % -- --  01/31/20 1446 -- -- -- -- -- -- 5\' 8"  (1.727 m) 90.7 kg    7:59 PM Reevaluation with update and discussion. After initial assessment and treatment, an updated evaluation reveals she is comfortable  has no further complaints.  Findings discussed and questions answered. Daleen Bo   Medical Decision Making:  This patient is presenting for evaluation of chest pain and shortness of breath, which does require a range of treatment options, and is a complaint that involves a high risk of morbidity and mortality. The differential diagnoses include ACS,  pneumonia, heart failure, complications of rheumatoid arthritis. I decided to review old records, and in summary middle-aged female, being followed closely by her rheumatologist for rheumatoid arthritis, and treated for hordeolum of the left upper eyelid, yesterday.  I do not require additional historical information from anyone.  Clinical Laboratory Tests Ordered, included CBC, Metabolic panel and D-dimer, troponin test, hepatic function panel. Review indicates normal findings. Radiologic Tests Ordered, included chest x-ray.  I independently Visualized: Radiographic images, which show no infiltrate or CHF  Cardiac Monitor Tracing which shows sinus rhythm    Critical Interventions-clinical evaluation, laboratory testing, chest x-ray, observation reassessment   After These Interventions, the Patient was reevaluated and was found stable for discharge.  Patient with nonspecific ongoing complaints of chest pain or shortness of breath, with suspected rheumatoid flare, by her report.  She was treated yesterday for hordeolum, left upper eye, had purulent material expressed from it.  Today the left upper eyelid appears normal without signs ThyroShield lesion.  Doubt systemic illness, ACS, metabolic disorder or acute pulmonary process.  CRITICAL CARE- no    Performed by: Daleen Bo  Nursing Notes Reviewed/ Care Coordinated Applicable Imaging Reviewed Interpretation of Laboratory Data incorporated into ED treatment  The patient appears reasonably screened and/or stabilized for discharge and I doubt any other medical condition or other [facility] requiring further screening, evaluation, or treatment in the ED at this time prior to discharge.  Plan: Home Medications-continue usual, okay to stop antibiotic; Home Treatments-rest, fluids, gradual advance activity; return here if the recommended treatment, does not improve the symptoms; Recommended follow up-PCP,     Final Clinical Impression(s) / ED  Diagnoses Final diagnoses:  Nonspecific chest pain    Rx / DC Orders ED Discharge Orders    None       Daleen Bo, MD 02/01/20 1001

## 2020-01-31 NOTE — ED Triage Notes (Signed)
Pt presents with central chest pain with nausea & dizziness X 3 days that increases with movement & activity.    Pt was transported by EMS earlier today to [facility] ED after having a LOC at [facility] pharmacy but LWBS.  Pt states she had a surgical procedure on her eyes yesterday.

## 2020-01-31 NOTE — ED Notes (Signed)
Patient said she felt an increase in dizziness and nausea when she stood up during orthostatic vital signs.

## 2020-05-21 ENCOUNTER — Other Ambulatory Visit: Payer: Self-pay

## 2020-05-21 ENCOUNTER — Ambulatory Visit (HOSPITAL_COMMUNITY)
Admission: EM | Admit: 2020-05-21 | Discharge: 2020-05-21 | Disposition: A | Discharge status: Home / Self Care | Payer: Medicare Other

## 2020-05-21 ENCOUNTER — Encounter (HOSPITAL_COMMUNITY): Payer: Self-pay

## 2020-05-21 DIAGNOSIS — H0014 Chalazion left upper eyelid: Secondary | ICD-10-CM

## 2020-05-21 HISTORY — DX: Cutaneous abscess, unspecified: L02.91

## 2020-05-21 MED ORDER — ERYTHROMYCIN 5 MG/GM OP OINT: TOPICAL_OINTMENT | OPHTHALMIC | 0 refills | Status: DC

## 2020-05-21 NOTE — ED Triage Notes (Signed)
Pt with reported abscess on L eyelid, which pt states she has had previously and was drained before. Pt has noticed pain and swelling to eyelid since Saturday and states her vision is not affected.

## 2020-05-21 NOTE — ED Provider Notes (Signed)
[facility]    CSN: [id number] Arrival date & time: 05/21/20  1123      History   Chief Complaint Chief Complaint  Patient presents with  . Abscess    Above left eye    HPI Brittney [name] is a 50 y.o. female.   Patient presenting today with 5-day history of left eyelid mass that has caused irritation to the eye.  She states she had something similar about 4 months ago and this was drained by ophthalmology with full resolution with this and antibiotic ointment.  She states this feels exactly like the last episode.  Has been unable so far to get in with ophthalmology this week.  Trying warm compresses and over-the-counter pain relievers with minimal relief.  Denies drainage, redness, fever, chills, headache, visual changes.     Past Medical History:  Diagnosis Date  . Abscess    to eyelid  . Anxiety   . Arthritis    KNEES ELBOWS HIPS SHOULDER FINGERS  . Asthma   . Back pain   . Bipolar disorder ([place])   . Depression    BIPOLAR  . Dyspnea   . Fibromyalgia   . GERD (gastroesophageal reflux disease)   . Headache syndrome 12/12/2019  . Headache(784.0)    MIGRAINES  . Hyperlipidemia   . Memory difficulties 06/21/2014  . Neck pain   . OA (osteoarthritis) of knee   . Obesity   . 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  . Seizures ([place])    FACIAL SEIZURES LAST 4 DAYS AGO    Patient Active Problem List   Diagnosis Date Noted  . Headache syndrome 12/12/2019  . Bipolar 1 disorder, depressed, moderate ([place]) 12/14/2018  . 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  . Fibromyalgia 02/03/2015  . Tendinitis of both rotator cuffs 02/03/2015  . 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 Insomnia secondary to chronic pain 11/13/2014  . Fatigue due to sleep pattern disturbance 11/13/2014  . Memory difficulties 06/21/2014  . 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  . PTSD (post-traumatic stress disorder) 12/13/2013  . Bipolar  depression ([place]) 12/12/2013  . PERICARDIAL EFFUSION 12/23/2008  . PSEUDOTUMOR CEREBRI 12/05/2008  . UNSPECIFIED PLEURAL EFFUSION 12/05/2008  . DYSPNEA 12/05/2008  . OBESITY 11/19/2008  . Migraine 11/19/2008  . ASTHMA 11/19/2008  . OTHER SPECIFIED DISORDER OF STOMACH AND DUODENUM 03/11/2008    Past Surgical History:  Procedure Laterality Date  . CARDIAC CATHETERIZATION  2010  . DILATION AND CURETTAGE OF UTERUS  1994  . LAPAROSCOPY N/A 11/21/2012   Procedure: LAPAROSCOPY OPERATIVE REMOVAL OF RIGHT TUBE AND OVARY AND FLUID FROM MASS;  Surgeon: Melina Schools, MD;  Location: [facility];  Service: Gynecology;  Laterality: N/A;  1 1/2hrs OR time  . OOPHORECTOMY      OB History   No obstetric history on file.      Home Medications    Prior to Admission medications   Medication Sig Start Date End Date Taking? Authorizing Provider  celecoxib (CELEBREX) 100 MG capsule Take 200 mg by mouth daily. Takes on as needed basis 12/03/19  Yes [provider]  cyclobenzaprine (FLEXERIL) 10 MG tablet Take 10 mg by mouth 3 (three) times daily as needed for muscle spasms.   Yes [provider]  erythromycin ophthalmic ointment Place a 1/2 inch ribbon of ointment into the lower eyelid twice daily. 05/21/20  Yes Volney American, PA-C  valACYclovir (VALTREX) 1000 MG tablet Take 500 mg by mouth 2 (two)  times daily. 10/21/18   [provider]    Family History Family History  Problem Relation Age of Onset  . Cancer - Lung Father   . Diabetes Father   . Cirrhosis Mother   . Migraines Sister   . Stroke Sister   . Healthy Brother   . Healthy Sister   . Healthy Sister   . Healthy Sister   . Healthy Brother   . Healthy Brother   . Healthy Brother   . Healthy Brother   . Healthy Brother   . Heart disease Maternal Grandmother   . Alzheimer's disease Maternal Grandfather   . Cancer - Lung Paternal Grandmother   . Breast cancer Daughter 31    Social History Social  History   Tobacco Use  . Smoking status: Former Smoker    Quit date: 02/16/2003    Years since quitting: 17.2  . Smokeless tobacco: Never Used  Vaping Use  . Vaping Use: Never used  Substance Use Topics  . Alcohol use: No    Alcohol/week: 0.0 standard drinks    Comment: occasionally  . Drug use: No     Allergies   Carbamazepine and Nsaids   Review of Systems Review of Systems Per HPI Physical Exam Triage Vital Signs ED Triage Vitals  Enc Vitals Group     BP 05/21/20 1218 103/71     Pulse Rate 05/21/20 1218 (!) 57     Resp 05/21/20 1218 18     Temp 05/21/20 1218 99 F (37.2 C)     Temp src --      SpO2 05/21/20 1218 97 %     Weight --      Height --      Head Circumference --      Peak Flow --      Pain Score 05/21/20 1213 7     Pain Loc --      Pain Edu? --      Excl. in [place]? --    No data found.  Updated Vital Signs BP 103/71   Pulse (!) 57   Temp 99 F (37.2 C)   Resp 18   SpO2 97%   Visual Acuity Right Eye Distance:   Left Eye Distance:   Bilateral Distance:    Right Eye Near:   Left Eye Near:    Bilateral Near:     Physical Exam Vitals and nursing note reviewed.  Constitutional:      Appearance: Normal appearance. She is not ill-appearing.  HENT:     Head: Atraumatic.     Mouth/Throat:     Mouth: Mucous membranes are moist.     Pharynx: Oropharynx is clear. No posterior oropharyngeal erythema.  Eyes:     Extraocular Movements: Extraocular movements intact.     Conjunctiva/sclera: Conjunctivae normal.     Comments: Small firm, nonerythematous palpable mass near lash line left upper eyelid  Cardiovascular:     Rate and Rhythm: Normal rate and regular rhythm.     Heart sounds: Normal heart sounds.  Pulmonary:     Effort: Pulmonary effort is normal.     Breath sounds: Normal breath sounds.  Musculoskeletal:        General: Normal range of motion.     Cervical back: Normal range of motion and neck supple.  Skin:    General: Skin is  warm and dry.  Neurological:     Mental Status: She is alert and oriented to person, place, and time.  Psychiatric:        Mood and Affect: Mood normal.        Thought Content: Thought content normal.        Judgment: Judgment normal.      UC Treatments / Results  Labs (all labs ordered are listed, but only abnormal results are displayed) Labs Reviewed - No data to display  EKG   Radiology No results found.  Procedures Procedures (including critical care time)  Medications Ordered in UC Medications - No data to display  Initial Impression / Assessment and Plan / UC Course  I have reviewed the triage vital signs and the nursing notes.  Pertinent labs & imaging results that were available during my care of the patient were reviewed by me and considered in my medical decision making (see chart for details).     Suspect chalazion, does not appear to be infected at this time.  Globe of eye without any abnormalities today.  Will defer visual acuity testing as she has no vision changes with the symptoms.  Erythromycin ointment sent for comfort and prevention of bacterial infection.  May also use dry eye drops.  Follow-up with ophthalmology if not fully resolving.   Final Clinical Impressions(s) / UC Diagnoses   Final diagnoses:  Chalazion of left upper eyelid   Discharge Instructions   None    ED Prescriptions    Medication Sig Dispense Auth. Provider   erythromycin ophthalmic ointment Place a 1/2 inch ribbon of ointment into the lower eyelid twice daily. 3.5 g Volney American, [place]     PDMP not reviewed this encounter.   Volney American, [place] 05/21/20 1321

## 2020-06-20 ENCOUNTER — Encounter (HOSPITAL_COMMUNITY): Payer: Self-pay

## 2020-06-20 ENCOUNTER — Ambulatory Visit (HOSPITAL_COMMUNITY)
Admission: RE | Admit: 2020-06-20 | Discharge: 2020-06-20 | Disposition: A | Discharge status: Home / Self Care | Payer: Medicare Other | Source: Ambulatory Visit | Attending: Emergency Medicine | Admitting: Emergency Medicine

## 2020-06-20 ENCOUNTER — Other Ambulatory Visit: Payer: Self-pay

## 2020-06-20 VITALS — BP 94/66 | HR 70 | Temp 99.4°F | Resp 17

## 2020-06-20 DIAGNOSIS — M545 Low back pain, unspecified: Secondary | ICD-10-CM

## 2020-06-20 MED ORDER — MELOXICAM 7.5 MG PO TABS: 7.5000 mg | ORAL_TABLET | Freq: Every day | ORAL | 0 refills | Status: DC

## 2020-06-20 MED ORDER — TRAMADOL HCL 50 MG PO TABS: 50.0000 mg | ORAL_TABLET | Freq: Three times a day (TID) | ORAL | 0 refills | Status: DC | PRN

## 2020-06-20 MED ORDER — ORPHENADRINE CITRATE ER 100 MG PO TB12: 100.0000 mg | ORAL_TABLET | Freq: Two times a day (BID) | ORAL | 0 refills | Status: DC | PRN

## 2020-06-20 NOTE — Discharge Instructions (Signed)
Light and regular activity as tolerated.  See exercises provided.  Heat application while active can help with muscle spasms.  Sleep with pillow under your knees.   Stop the celebrex (if you prefer to try meloxicam). Meloxicam daily. Don't take additional ibuprofen for aleve. Take with food. You may also take with omeprazole to try to prevent stomach symptoms.  You can try Norflex as a muscle relaxer instead of your flexeril, do not take both.  Tramadol as needed for breakthrough pain. May cause drowsiness. Please do not take if driving or drinking alcohol.  May also cause constipation.  Follow up with your orthopedist if no improvement as you may need further evaluation or treatment.

## 2020-06-20 NOTE — ED Triage Notes (Signed)
Pt presents with with lower back pain X 4 days.

## 2020-06-20 NOTE — ED Provider Notes (Signed)
[facility]    CSN: [id number] Arrival date & time: 06/20/20  1450      History   Chief Complaint Chief Complaint  Patient presents with  . APPOINTMENT : Back Pain    HPI Brittney [name] is a 50 y.o. female.   Brittney [name] presents with complaints of bilateral low back pain, R>L which started around 4 days ago. She had been walking much more than typical prior to onset of symptoms. She lifted one tote, but didn't note any immediate pain following this. Pain radiates to left hip. Sitting worsens the pain. Transitioning from sitting to standing worsens the pain and weight bearing on her right leg worsens the pain. No numbness or tingling. No saddle symptoms. No bladder or bowel incontinence. Has had some back pain in the past but has been years and has been more mild than this. History of hip arthritis. History of lumbar DDD per patient. Started taking celebrex twice a day and using flexeril, two days ago, and hasn't gotten any pain relief. No fevers.     ROS per HPI, negative if not otherwise mentioned.      Past Medical History:  Diagnosis Date  . Abscess    to eyelid  . Anxiety   . Arthritis    KNEES ELBOWS HIPS SHOULDER FINGERS  . Asthma   . Back pain   . Bipolar disorder ([place])   . Depression    BIPOLAR  . Dyspnea   . Fibromyalgia   . GERD (gastroesophageal reflux disease)   . Headache syndrome 12/12/2019  . Headache(784.0)    MIGRAINES  . Hyperlipidemia   . Memory difficulties 06/21/2014  . Neck pain   . OA (osteoarthritis) of knee   . Obesity   . 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  . 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 Headache syndrome 12/12/2019  . Bipolar 1 disorder, depressed, moderate ([place]) 12/14/2018  . 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  . Fibromyalgia 02/03/2015  . Tendinitis of both rotator cuffs 02/03/2015  . Insomnia  due to mental condition 11/13/2014  . Insomnia secondary to chronic pain 11/13/2014  . Fatigue due to sleep pattern disturbance 11/13/2014  . Memory difficulties 06/21/2014  . 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  . PTSD (post-traumatic stress disorder) 12/13/2013  . Bipolar depression ([place]) 12/12/2013  . PERICARDIAL EFFUSION 12/23/2008  . PSEUDOTUMOR CEREBRI 12/05/2008  . UNSPECIFIED PLEURAL EFFUSION 12/05/2008  . DYSPNEA 12/05/2008  . OBESITY 11/19/2008  . Migraine 11/19/2008  . ASTHMA 11/19/2008  . OTHER SPECIFIED DISORDER OF STOMACH AND DUODENUM 03/11/2008    Past Surgical History:  Procedure Laterality Date  . CARDIAC CATHETERIZATION  2010  . DILATION AND CURETTAGE OF UTERUS  1994  . LAPAROSCOPY N/A 11/21/2012   Procedure: LAPAROSCOPY OPERATIVE REMOVAL OF RIGHT TUBE AND OVARY AND FLUID FROM MASS;  Surgeon: Melina Schools, MD;  Location: [facility];  Service: Gynecology;  Laterality: N/A;  1 1/2hrs OR time  . OOPHORECTOMY      OB History   No obstetric history on file.      Home Medications    Prior to Admission medications   Medication Sig Start Date End Date Taking? Authorizing Provider  meloxicam (MOBIC) 7.5 MG tablet Take 1-2 tablets (7.5-15 mg total) by mouth daily. 06/20/20  Yes Augusto Gamble B, NP  orphenadrine (NORFLEX) 100 MG tablet Take 1  tablet (100 mg total) by mouth 2 (two) times daily as needed for muscle spasms. 06/20/20  Yes Zigmund Gottron, NP  traMADol (ULTRAM) 50 MG tablet Take 1 tablet (50 mg total) by mouth every 8 (eight) hours as needed for severe pain. 06/20/20  Yes Augusto Gamble B, NP  celecoxib (CELEBREX) 100 MG capsule Take 200 mg by mouth daily. Takes on as needed basis 12/03/19   [provider]  cyclobenzaprine (FLEXERIL) 10 MG tablet Take 10 mg by mouth 3 (three) times daily as needed for muscle spasms.    [provider]  erythromycin ophthalmic ointment Place a 1/2 inch ribbon of ointment into the lower eyelid twice daily.  05/21/20   Volney American, PA-C  valACYclovir (VALTREX) 1000 MG tablet Take 500 mg by mouth 2 (two) times daily. 10/21/18   [provider]    Family History Family History  Problem Relation Age of Onset  . Cancer - Lung Father   . Diabetes Father   . Cirrhosis Mother   . Migraines Sister   . Stroke Sister   . Healthy Brother   . Healthy Sister   . Healthy Sister   . Healthy Sister   . Healthy Brother   . Healthy Brother   . Healthy Brother   . Healthy Brother   . Healthy Brother   . Heart disease Maternal Grandmother   . Alzheimer's disease Maternal Grandfather   . Cancer - Lung Paternal Grandmother   . Breast cancer Daughter 27    Social History Social History   Tobacco Use  . Smoking status: Former Smoker    Quit date: 02/16/2003    Years since quitting: 17.3  . Smokeless tobacco: Never Used  Vaping Use  . Vaping Use: Never used  Substance Use Topics  . Alcohol use: No    Alcohol/week: 0.0 standard drinks    Comment: occasionally  . Drug use: No     Allergies   Carbamazepine and Nsaids   Review of Systems Review of Systems   Physical Exam Triage Vital Signs ED Triage Vitals  Enc Vitals Group     BP 06/20/20 1526 94/66     Pulse Rate 06/20/20 1526 70     Resp 06/20/20 1526 17     Temp 06/20/20 1526 99.4 F (37.4 C)     Temp Source 06/20/20 1526 Oral     SpO2 06/20/20 1526 100 %     Weight --      Height --      Head Circumference --      Peak Flow --      Pain Score 06/20/20 1525 8     Pain Loc --      Pain Edu? --      Excl. in [place]? --    No data found.  Updated Vital Signs BP 94/66 (BP Location: Left Arm)   Pulse 70   Temp 99.4 F (37.4 C) (Oral)   Resp 17   SpO2 100%    Physical Exam Constitutional:      General: She is not in acute distress.    Appearance: She is well-developed.  Cardiovascular:     Rate and Rhythm: Normal rate.  Pulmonary:     Effort: Pulmonary effort is normal.  Musculoskeletal:     Lumbar  back: Tenderness present. No deformity, lacerations or bony tenderness. Normal range of motion.     Comments: Left low back paraspinal musculature with tenderness on palpation, pain with  lift hip flexion; pain with transition from sit to lay and lay to sit; strength equal bilaterally; gross sensation intact; ambulatory   Skin:    General: Skin is warm and dry.  Neurological:     Mental Status: She is alert and oriented to person, place, and time.      UC Treatments / Results  Labs (all labs ordered are listed, but only abnormal results are displayed) Labs Reviewed - No data to display  EKG   Radiology No results found.  Procedures Procedures (including critical care time)  Medications Ordered in UC Medications - No data to display  Initial Impression / Assessment and Plan / UC Course  I have reviewed the triage vital signs and the nursing notes.  Pertinent labs & imaging results that were available during my care of the patient were reviewed by me and considered in my medical decision making (see chart for details).     No red flag findings here today. Suspect that her recent increased walking triggered low back pain. Pain management and expected course of rehab discussed. We are going to try changing up her medication regimen, as she has been suicidal with steroids in the past. Follow up recommendations discussed as well. Patient verbalized understanding and agreeable to plan.  Ambulatory out of clinic without difficulty.    Final Clinical Impressions(s) / UC Diagnoses   Final diagnoses:  Acute left-sided low back pain without sciatica     Discharge Instructions     Light and regular activity as tolerated.  See exercises provided.  Heat application while active can help with muscle spasms.  Sleep with pillow under your knees.   Stop the celebrex (if you prefer to try meloxicam). Meloxicam daily. Don't take additional ibuprofen for aleve. Take with food. You may also  take with omeprazole to try to prevent stomach symptoms.  You can try Norflex as a muscle relaxer instead of your flexeril, do not take both.  Tramadol as needed for breakthrough pain. May cause drowsiness. Please do not take if driving or drinking alcohol.  May also cause constipation.  Follow up with your orthopedist if no improvement as you may need further evaluation or treatment.    ED Prescriptions    Medication Sig Dispense Auth. Provider   traMADol (ULTRAM) 50 MG tablet Take 1 tablet (50 mg total) by mouth every 8 (eight) hours as needed for severe pain. 10 tablet Augusto Gamble B, NP   meloxicam (MOBIC) 7.5 MG tablet Take 1-2 tablets (7.5-15 mg total) by mouth daily. 30 tablet Augusto Gamble B, NP   orphenadrine (NORFLEX) 100 MG tablet Take 1 tablet (100 mg total) by mouth 2 (two) times daily as needed for muscle spasms. 30 tablet Augusto Gamble B, NP     I have reviewed the PDMP during this encounter.   Zigmund Gottron, NP 06/20/20 1624

## 2020-08-06 ENCOUNTER — Ambulatory Visit (HOSPITAL_COMMUNITY): Payer: Medicare Other

## 2020-08-07 ENCOUNTER — Other Ambulatory Visit: Payer: Self-pay

## 2020-08-07 ENCOUNTER — Encounter (HOSPITAL_COMMUNITY): Payer: Self-pay

## 2020-08-07 ENCOUNTER — Ambulatory Visit (HOSPITAL_COMMUNITY)
Admission: RE | Admit: 2020-08-07 | Discharge: 2020-08-07 | Disposition: A | Discharge status: Home / Self Care | Payer: Medicare Other | Source: Ambulatory Visit | Attending: Emergency Medicine | Admitting: Emergency Medicine

## 2020-08-07 ENCOUNTER — Other Ambulatory Visit: Payer: Self-pay | Admitting: Obstetrics and Gynecology

## 2020-08-07 VITALS — BP 112/78 | HR 60 | Temp 98.7°F | Resp 18

## 2020-08-07 DIAGNOSIS — Z1231 Encounter for screening mammogram for malignant neoplasm of breast: Secondary | ICD-10-CM

## 2020-08-07 DIAGNOSIS — J0100 Acute maxillary sinusitis, unspecified: Secondary | ICD-10-CM | POA: Diagnosis not present

## 2020-08-07 MED ORDER — IBUPROFEN 600 MG PO TABS: 600.0000 mg | ORAL_TABLET | Freq: Four times a day (QID) | ORAL | 0 refills | Status: DC | PRN

## 2020-08-07 MED ORDER — AMOXICILLIN-POT CLAVULANATE 875-125 MG PO TABS: 1.0000 | ORAL_TABLET | Freq: Two times a day (BID) | ORAL | 0 refills | Status: DC

## 2020-08-07 MED ORDER — FLUTICASONE PROPIONATE 50 MCG/ACT NA SUSP: 1.0000 | Freq: Two times a day (BID) | NASAL | 2 refills | Status: DC

## 2020-08-07 MED ORDER — LIDOCAINE VISCOUS HCL 2 % MT SOLN: 15.0000 mL | OROMUCOSAL | 0 refills | Status: DC | PRN

## 2020-08-07 NOTE — ED Triage Notes (Signed)
Pt presents with sore throat, bilateral ear pain, and headache X 3 days.

## 2020-08-07 NOTE — ED Provider Notes (Signed)
[facility]    CSN: [id number] Arrival date & time: 08/07/20  1343      History   Chief Complaint Chief Complaint  Patient presents with   APPOINTMENT : Sore Throat, Bilateral Ear Pain & Headache     HPI Brittney [name] is a 50 y.o. female.   Patient presents with intermittent generalized headache, nasal congestion, bilateral ear pain, facial pain, sore throat, nasal congestion, increased shortness of breath from baseline. Denies fever, chills, body aches, abdominal pain, nausea, vomiting, diarrhea, chest pain or tightness. Has used Celebrex and salt water gargles for relief.   Past Medical History:  Diagnosis Date   Abscess    to eyelid   Anxiety    Arthritis    KNEES ELBOWS HIPS SHOULDER FINGERS   Asthma    Back pain    Bipolar disorder ([facility])    Depression    BIPOLAR   Dyspnea    Fibromyalgia    GERD (gastroesophageal reflux disease)    Headache syndrome 12/12/2019   Headache(784.0)    MIGRAINES   Hyperlipidemia    Memory difficulties 06/21/2014   Neck pain    OA (osteoarthritis) of knee    Obesity    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   Seizures ([place])    FACIAL SEIZURES LAST 4 DAYS AGO    Patient Active Problem List   Diagnosis Date Noted   Headache syndrome 12/12/2019   Bipolar 1 disorder, depressed, moderate ([place]) 12/14/2018   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   Fibromyalgia 02/03/2015   Tendinitis of both rotator cuffs 02/03/2015   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   Insomnia secondary to chronic pain 11/13/2014   Fatigue due to sleep pattern disturbance 11/13/2014   Memory difficulties 06/21/2014   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   PTSD (post-traumatic stress disorder) 12/13/2013   Bipolar depression ([place]) 12/12/2013   PERICARDIAL EFFUSION 12/23/2008   PSEUDOTUMOR CEREBRI 12/05/2008   UNSPECIFIED PLEURAL EFFUSION 12/05/2008   DYSPNEA 12/05/2008   OBESITY 11/19/2008    Migraine 11/19/2008   ASTHMA 11/19/2008   OTHER SPECIFIED DISORDER OF STOMACH AND DUODENUM 03/11/2008    Past Surgical History:  Procedure Laterality Date   CARDIAC CATHETERIZATION  2010   DILATION AND CURETTAGE OF UTERUS  1994   LAPAROSCOPY N/A 11/21/2012   Procedure: LAPAROSCOPY OPERATIVE REMOVAL OF RIGHT TUBE AND OVARY AND FLUID FROM MASS;  Surgeon: Melina Schools, MD;  Location: [facility];  Service: Gynecology;  Laterality: N/A;  1 1/2hrs OR time   OOPHORECTOMY      OB History   No obstetric history on file.      Home Medications    Prior to Admission medications   Medication Sig Start Date End Date Taking? Authorizing Provider  fluticasone (FLONASE) 50 MCG/ACT nasal spray Place 1 spray into both nostrils 2 (two) times daily. 08/07/20  Yes Makayla Confer R, NP  ibuprofen (ADVIL) 600 MG tablet Take 1 tablet (600 mg total) by mouth every 6 (six) hours as needed. 08/07/20  Yes Bracha Frankowski R, NP  lidocaine (XYLOCAINE) 2 % solution Use as directed 15 mLs in the mouth or throat as needed for mouth pain. 08/07/20  Yes Rahmon Heigl, Leitha Schuller, NP  amoxicillin-clavulanate (AUGMENTIN) 875-125 MG tablet Take 1 tablet by mouth every 12 (twelve) hours. 08/14/20   Hans Eden, NP  celecoxib (CELEBREX) 100 MG capsule Take 200 mg by mouth daily. Takes on as needed basis 12/03/19   [provider]  cyclobenzaprine (FLEXERIL) 10 MG tablet Take 10 mg by mouth 3 (three) times daily as needed for muscle spasms.    [provider]  erythromycin ophthalmic ointment Place a 1/2 inch ribbon of ointment into the lower eyelid twice daily. 05/21/20   Volney American, PA-C  meloxicam (MOBIC) 7.5 MG tablet Take 1-2 tablets (7.5-15 mg total) by mouth daily. 06/20/20   Zigmund Gottron, NP  orphenadrine (NORFLEX) 100 MG tablet Take 1 tablet (100 mg total) by mouth 2 (two) times daily as needed for muscle spasms. 06/20/20   Zigmund Gottron, NP  traMADol (ULTRAM) 50 MG tablet Take 1 tablet (50  mg total) by mouth every 8 (eight) hours as needed for severe pain. 06/20/20   Zigmund Gottron, NP  valACYclovir (VALTREX) 1000 MG tablet Take 500 mg by mouth 2 (two) times daily. 10/21/18   [provider]    Family History Family History  Problem Relation Age of Onset   Cancer - Lung Father    Diabetes Father    Cirrhosis Mother    Migraines Sister    Stroke Sister    Healthy Brother    Healthy Sister    Healthy Sister    Healthy Sister    Healthy Brother    Healthy Brother    Healthy Brother    Healthy Brother    Healthy Brother    Heart disease Maternal Grandmother    Alzheimer's disease Maternal Grandfather    Cancer - Lung Paternal Grandmother    Breast cancer Daughter 8    Social History Social History   Tobacco Use   Smoking status: Former    Pack years: 0.00    Types: Cigarettes    Quit date: 02/16/2003    Years since quitting: 17.4   Smokeless tobacco: Never  Vaping Use   Vaping Use: Never used  Substance Use Topics   Alcohol use: No    Alcohol/week: 0.0 standard drinks    Comment: occasionally   Drug use: No     Allergies   Carbamazepine and Nsaids   Review of Systems Review of Systems Defer to HPI    Physical Exam Triage Vital Signs ED Triage Vitals  Enc Vitals Group     BP 08/07/20 1422 112/78     Pulse Rate 08/07/20 1422 60     Resp 08/07/20 1422 18     Temp 08/07/20 1422 98.7 F (37.1 C)     Temp Source 08/07/20 1422 Oral     SpO2 08/07/20 1422 98 %     Weight --      Height --      Head Circumference --      Peak Flow --      Pain Score 08/07/20 1420 6     Pain Loc --      Pain Edu? --      Excl. in [place]? --    No data found.  Updated Vital Signs BP 112/78 (BP Location: Right Arm)   Pulse 60   Temp 98.7 F (37.1 C) (Oral)   Resp 18   SpO2 98%   Visual Acuity Right Eye Distance:   Left Eye Distance:   Bilateral Distance:    Right Eye Near:   Left Eye Near:    Bilateral Near:     Physical  Exam Constitutional:      Appearance: She is normal weight. She is ill-appearing.  HENT:     Head: Normocephalic.  Right Ear: Hearing, ear canal and external ear normal. A middle ear effusion is present.     Left Ear: Hearing, ear canal and external ear normal. A middle ear effusion is present.     Nose: Congestion present. No rhinorrhea.     Mouth/Throat:     Mouth: Mucous membranes are moist.     Pharynx: Posterior oropharyngeal erythema present. No oropharyngeal exudate.  Eyes:     Extraocular Movements: Extraocular movements intact.     Conjunctiva/sclera: Conjunctivae normal.     Pupils: Pupils are equal, round, and reactive to light.  Cardiovascular:     Rate and Rhythm: Normal rate and regular rhythm.     Pulses: Normal pulses.     Heart sounds: Normal heart sounds.  Pulmonary:     Effort: Pulmonary effort is normal.     Breath sounds: Normal breath sounds.  Musculoskeletal:     Cervical back: Normal range of motion.  Lymphadenopathy:     Cervical: Cervical adenopathy present.  Skin:    General: Skin is warm and dry.  Neurological:     Mental Status: She is alert. Mental status is at baseline.  Psychiatric:        Mood and Affect: Mood normal.        Behavior: Behavior normal.     UC Treatments / Results  Labs (all labs ordered are listed, but only abnormal results are displayed) Labs Reviewed - No data to display  EKG   Radiology No results found.  Procedures Procedures (including critical care time)  Medications Ordered in UC Medications - No data to display  Initial Impression / Assessment and Plan / UC Course  I have reviewed the triage vital signs and the nursing notes.  Pertinent labs & imaging results that were available during my care of the patient were reviewed by me and considered in my medical decision making (see chart for details).  Acute maxillary sinusitis  Flonase bid prn Ibuprofen 600 mg every 6 hours prn Lidocaine viscous 2%  every 4 hours prn, salt water gargles, hot liquids, throat lozenges  Watchful waiting until 08/14/20, Augmentin 875/125 bid for 7 days will  pharmacy on day 10 of infection  Final Clinical Impressions(s) / UC Diagnoses   Final diagnoses:  Acute non-recurrent maxillary sinusitis     Discharge Instructions      Can use nasal spray twice a day , aim outward, can help with congestion and sinus pressure  Can use ibuprofen every 6 hours for headache  Can use lidocaine rinse, gargle, swish and spit every 4 hours as needed for throat pain, can continue salt water gargles, peroxide gargle, throat lozenges and hot liquids for comfort    Antibiotic will be available on day 10 (08/14/20) if symptoms are still present    ED Prescriptions     Medication Sig Dispense Auth. Provider   ibuprofen (ADVIL) 600 MG tablet Take 1 tablet (600 mg total) by mouth every 6 (six) hours as needed. 30 tablet Noemy Hallmon R, NP   fluticasone (FLONASE) 50 MCG/ACT nasal spray Place 1 spray into both nostrils 2 (two) times daily. 11.1 mL Yomayra Tate R, NP   lidocaine (XYLOCAINE) 2 % solution Use as directed 15 mLs in the mouth or throat as needed for mouth pain. 100 mL Lowella Petties R, NP   amoxicillin-clavulanate (AUGMENTIN) 875-125 MG tablet  (Status: Discontinued) Take 1 tablet by mouth every 12 (twelve) hours. 14 tablet Kobee Medlen, Vincente Liberty R, NP   amoxicillin-clavulanate (AUGMENTIN)  875-125 MG tablet Take 1 tablet by mouth every 12 (twelve) hours. 14 tablet Brittanyann Wittner, Leitha Schuller, NP      PDMP not reviewed this encounter.   Hans Eden, [place] 08/07/20 [phone]

## 2020-08-07 NOTE — Discharge Instructions (Addendum)
Can use nasal spray twice a day , aim outward, can help with congestion and sinus pressure  Can use ibuprofen every 6 hours for headache  Can use lidocaine rinse, gargle, swish and spit every 4 hours as needed for throat pain, can continue salt water gargles, peroxide gargle, throat lozenges and hot liquids for comfort    Antibiotic will be available on day 10 (08/14/20) if symptoms are still present

## 2020-09-02 ENCOUNTER — Encounter (HOSPITAL_BASED_OUTPATIENT_CLINIC_OR_DEPARTMENT_OTHER): Payer: Self-pay | Admitting: Emergency Medicine

## 2020-09-02 ENCOUNTER — Other Ambulatory Visit: Payer: Self-pay

## 2020-09-02 ENCOUNTER — Emergency Department (HOSPITAL_BASED_OUTPATIENT_CLINIC_OR_DEPARTMENT_OTHER)
Admission: EM | Admit: 2020-09-02 | Discharge: 2020-09-02 | Disposition: A | Discharge status: Home / Self Care | Payer: Medicare Other | Attending: Emergency Medicine | Admitting: Emergency Medicine

## 2020-09-02 DIAGNOSIS — R202 Paresthesia of skin: Secondary | ICD-10-CM | POA: Insufficient documentation

## 2020-09-02 DIAGNOSIS — J45909 Unspecified asthma, uncomplicated: Secondary | ICD-10-CM | POA: Diagnosis not present

## 2020-09-02 DIAGNOSIS — M545 Low back pain, unspecified: Secondary | ICD-10-CM

## 2020-09-02 MED ORDER — MORPHINE SULFATE (PF) 4 MG/ML IV SOLN
6.0000 mg | Freq: Once | INTRAVENOUS | Status: AC
  Administered 2020-09-02: 6 mg via INTRAMUSCULAR
  Filled 2020-09-02: qty 2

## 2020-09-02 MED ORDER — DIAZEPAM 5 MG PO TABS
5.0000 mg | ORAL_TABLET | Freq: Once | ORAL | Status: AC
  Administered 2020-09-02: 5 mg via ORAL
  Filled 2020-09-02: qty 1

## 2020-09-02 MED ORDER — DEXAMETHASONE SODIUM PHOSPHATE 10 MG/ML IJ SOLN
10.0000 mg | Freq: Once | INTRAMUSCULAR | Status: AC
  Administered 2020-09-02: 10 mg via INTRAMUSCULAR
  Filled 2020-09-02: qty 1

## 2020-09-02 NOTE — ED Triage Notes (Signed)
Pt arrived via EMS related to right lower back pain. Pt has a history of problems with a degenerative disc.

## 2020-09-02 NOTE — Discharge Instructions (Addendum)
Follow-up with your orthopedist.  Return here as needed if you have any worsening symptoms.

## 2020-09-02 NOTE — ED Provider Notes (Signed)
[facility] EMERGENCY DEPT Provider Note   CSN: [id number] Arrival date & time: 09/02/20  1005     History Chief Complaint  Patient presents with   Back Pain    Brittney [name] is a 50 y.o. female.  Patient is a 50 year old female with a history of fibromyalgia, bipolar disorder, anxiety, hyperlipidemia, pseudotumor cerebri and chronic back pain who presents with exacerbation of right lower back pain.  She says she was taking her car through [employer] and she leaned into the car to get a floor mat out.  She had a sudden onset of pain in her right lower back.  It radiates to her buttocks but does not radiate further down her legs.  She denies any weakness in the legs.  No loss of bowel or bladder control.  No recent fevers.  No other recent injuries.  She has had some intermittent tingling in her hands and her feet and feels some tingling in her feet now but says it does not feel any different than her baseline.  She did not take anything this morning as this happened when she was out of her home.  She arrived by EMS.  She does have Celebrex and Flexeril to use at home as needed for her back pain.      Past Medical History:  Diagnosis Date   Abscess    to eyelid   Anxiety    Arthritis    KNEES ELBOWS HIPS SHOULDER FINGERS   Asthma    Back pain    Bipolar disorder (HCC)    Depression    BIPOLAR   Dyspnea    Fibromyalgia    GERD (gastroesophageal reflux disease)    Headache syndrome 12/12/2019   Headache(784.0)    MIGRAINES   Hyperlipidemia    Memory difficulties 06/21/2014   Neck pain    OA (osteoarthritis) of knee    Obesity    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   Seizures ([place])    FACIAL SEIZURES LAST 4 DAYS AGO    Patient Active Problem List   Diagnosis Date Noted   Headache syndrome 12/12/2019   Bipolar 1 disorder, depressed, moderate ([place]) 12/14/2018   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    Fibromyalgia 02/03/2015   Tendinitis of both rotator cuffs 02/03/2015   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   Insomnia secondary to chronic pain 11/13/2014   Fatigue due to sleep pattern disturbance 11/13/2014   Memory difficulties 06/21/2014   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   PTSD (post-traumatic stress disorder) 12/13/2013   Bipolar depression ([place]) 12/12/2013   PERICARDIAL EFFUSION 12/23/2008   PSEUDOTUMOR CEREBRI 12/05/2008   UNSPECIFIED PLEURAL EFFUSION 12/05/2008   DYSPNEA 12/05/2008   OBESITY 11/19/2008   Migraine 11/19/2008   ASTHMA 11/19/2008   OTHER SPECIFIED DISORDER OF STOMACH AND DUODENUM 03/11/2008    Past Surgical History:  Procedure Laterality Date   CARDIAC CATHETERIZATION  2010   DILATION AND CURETTAGE OF UTERUS  1994   LAPAROSCOPY N/A 11/21/2012   Procedure: LAPAROSCOPY OPERATIVE REMOVAL OF RIGHT TUBE AND OVARY AND FLUID FROM MASS;  Surgeon: Melina Schools, MD;  Location: [facility];  Service: Gynecology;  Laterality: N/A;  1 1/2hrs OR time   OOPHORECTOMY       OB History   No obstetric history on file.     Family History  Problem Relation Age of Onset   Cancer - Lung Father    Diabetes Father    Cirrhosis Mother  Migraines Sister    Stroke Sister    Healthy Brother    Healthy Sister    Healthy Sister    Healthy Sister    Healthy Brother    Healthy Brother    Healthy Brother    Healthy Brother    Healthy Brother    Heart disease Maternal Grandmother    Alzheimer's disease Maternal Grandfather    Cancer - Lung Paternal Grandmother    Breast cancer Daughter 52    Social History   Tobacco Use   Smoking status: Former    Types: Cigarettes    Quit date: 02/16/2003    Years since quitting: 17.5   Smokeless tobacco: Never  Vaping Use   Vaping Use: Never used  Substance Use Topics   Alcohol use: No    Alcohol/week: 0.0 standard drinks    Comment: occasionally   Drug use: No    Home Medications Prior to Admission  medications   Medication Sig Start Date End Date Taking? Authorizing Provider  cyclobenzaprine (FLEXERIL) 10 MG tablet Take 10 mg by mouth 3 (three) times daily as needed for muscle spasms.   Yes [provider]  amoxicillin-clavulanate (AUGMENTIN) 875-125 MG tablet Take 1 tablet by mouth every 12 (twelve) hours. 08/14/20   Hans Eden, NP  celecoxib (CELEBREX) 100 MG capsule Take 200 mg by mouth daily. Takes on as needed basis 12/03/19   [provider]  erythromycin ophthalmic ointment Place a 1/2 inch ribbon of ointment into the lower eyelid twice daily. Patient not taking: Reported on 09/02/2020 05/21/20   Volney American, PA-C  fluticasone [facility]) 50 MCG/ACT nasal spray Place 1 spray into both nostrils 2 (two) times daily. Patient not taking: Reported on 09/02/2020 08/07/20   Hans Eden, NP  ibuprofen (ADVIL) 600 MG tablet Take 1 tablet (600 mg total) by mouth every 6 (six) hours as needed. Patient not taking: Reported on 09/02/2020 08/07/20   Hans Eden, NP  lidocaine (XYLOCAINE) 2 % solution Use as directed 15 mLs in the mouth or throat as needed for mouth pain. Patient not taking: Reported on 09/02/2020 08/07/20   Hans Eden, NP  meloxicam (MOBIC) 7.5 MG tablet Take 1-2 tablets (7.5-15 mg total) by mouth daily. Patient not taking: Reported on 09/02/2020 06/20/20   Augusto Gamble B, NP  orphenadrine (NORFLEX) 100 MG tablet Take 1 tablet (100 mg total) by mouth 2 (two) times daily as needed for muscle spasms. Patient not taking: Reported on 09/02/2020 06/20/20   Zigmund Gottron, NP  traMADol (ULTRAM) 50 MG tablet Take 1 tablet (50 mg total) by mouth every 8 (eight) hours as needed for severe pain. Patient not taking: Reported on 09/02/2020 06/20/20   Augusto Gamble B, NP  valACYclovir (VALTREX) 1000 MG tablet Take 500 mg by mouth 2 (two) times daily. 10/21/18   [provider]    Allergies    Carbamazepine and Nsaids  Review of Systems    Review of Systems  Constitutional:  Negative for fever.  Gastrointestinal:  Negative for nausea and vomiting.  Musculoskeletal:  Positive for back pain. Negative for arthralgias, joint swelling and neck pain.  Skin:  Negative for wound.  Neurological:  Positive for numbness. Negative for weakness and headaches.   Physical Exam Updated Vital Signs BP 109/81 (BP Location: Right Arm)   Pulse (!) 52   Temp 98.6 F (37 C) (Oral)   Resp 18   Ht 5\' 8"  (1.727 m)   Wt 90.7  kg   SpO2 100%   BMI 30.41 kg/m   Physical Exam Constitutional:      Appearance: She is well-developed.  HENT:     Head: Normocephalic and atraumatic.  Cardiovascular:     Rate and Rhythm: Normal rate.  Pulmonary:     Effort: Pulmonary effort is normal.  Musculoskeletal:        General: Tenderness present.     Cervical back: Normal range of motion and neck supple.     Comments: Positive tenderness in the right lower lumbar area and along the right sciatic nerve.  Negative straight leg raise bilaterally.  Patellar reflexes symmetric bilaterally.  She has normal sensation and motor function to the lower extremities bilaterally.  Skin:    General: Skin is warm and dry.  Neurological:     Mental Status: She is alert and oriented to person, place, and time.    ED Results / Procedures / Treatments   Labs (all labs ordered are listed, but only abnormal results are displayed) Labs Reviewed - No data to display  EKG None  Radiology No results found.  Procedures Procedures   Medications Ordered in ED Medications  diazepam (VALIUM) tablet 5 mg (has no administration in time range)  morphine 4 MG/ML injection 6 mg (6 mg Intramuscular Given 09/02/20 1031)  dexamethasone (DECADRON) injection 10 mg (10 mg Intramuscular Given 09/02/20 1034)    ED Course  I have reviewed the triage vital signs and the nursing notes.  Pertinent labs & imaging results that were available during my care of the patient were  reviewed by me and considered in my medical decision making (see chart for details).    MDM Rules/Calculators/A&P                          Patient is a 50 year old female who presents with right-sided back pain.  There is some associated pain over the sciatic area but no radiation down her legs.  No numbness or weakness in her legs other than some intermittent tingling that she has been having for a while that does not seem related to her back.  She does not have any other neurologic deficits.  No suggestions of cauda equina.  No fevers or other suggestions of infection.  Her pain is controlled after treatment in the ED.  She was discharged home in good condition.  She has medications at home to use including Celebrex, a muscle relaxer and tramadol.  She was encouraged to follow-up with her back specialist at [facility].  Return precautions were given. Final Clinical Impression(s) / ED Diagnoses Final diagnoses:  Acute right-sided low back pain without sciatica    Rx / DC Orders ED Discharge Orders     None        Malvin Johns, MD 09/02/20 1337

## 2020-09-02 NOTE — ED Notes (Signed)
Patient transported to CT 

## 2020-09-10 ENCOUNTER — Encounter: Payer: Self-pay | Admitting: *Deleted

## 2020-09-10 ENCOUNTER — Other Ambulatory Visit: Payer: Self-pay

## 2020-09-10 ENCOUNTER — Encounter: Payer: Self-pay | Admitting: Family

## 2020-09-10 ENCOUNTER — Ambulatory Visit (INDEPENDENT_AMBULATORY_CARE_PROVIDER_SITE_OTHER): Payer: Medicare Other | Admitting: Family

## 2020-09-10 VITALS — BP 110/70 | HR 62 | Temp 97.7°F | Resp 18 | Ht 68.0 in | Wt 212.8 lb

## 2020-09-10 DIAGNOSIS — K219 Gastro-esophageal reflux disease without esophagitis: Secondary | ICD-10-CM | POA: Diagnosis not present

## 2020-09-10 DIAGNOSIS — Z6830 Body mass index (BMI) 30.0-30.9, adult: Secondary | ICD-10-CM

## 2020-09-10 DIAGNOSIS — M1710 Unilateral primary osteoarthritis, unspecified knee: Secondary | ICD-10-CM | POA: Insufficient documentation

## 2020-09-10 DIAGNOSIS — G932 Benign intracranial hypertension: Secondary | ICD-10-CM

## 2020-09-10 DIAGNOSIS — R7303 Prediabetes: Secondary | ICD-10-CM

## 2020-09-10 DIAGNOSIS — F3132 Bipolar disorder, current episode depressed, moderate: Secondary | ICD-10-CM

## 2020-09-10 DIAGNOSIS — M05751 Rheumatoid arthritis with rheumatoid factor of right hip without organ or systems involvement: Secondary | ICD-10-CM

## 2020-09-10 DIAGNOSIS — Z0000 Encounter for general adult medical examination without abnormal findings: Secondary | ICD-10-CM | POA: Diagnosis not present

## 2020-09-10 DIAGNOSIS — M069 Rheumatoid arthritis, unspecified: Secondary | ICD-10-CM | POA: Insufficient documentation

## 2020-09-10 DIAGNOSIS — R42 Dizziness and giddiness: Secondary | ICD-10-CM

## 2020-09-10 DIAGNOSIS — M05752 Rheumatoid arthritis with rheumatoid factor of left hip without organ or systems involvement: Secondary | ICD-10-CM

## 2020-09-10 DIAGNOSIS — Z87891 Personal history of nicotine dependence: Secondary | ICD-10-CM

## 2020-09-10 DIAGNOSIS — F4310 Post-traumatic stress disorder, unspecified: Secondary | ICD-10-CM

## 2020-09-10 DIAGNOSIS — E782 Mixed hyperlipidemia: Secondary | ICD-10-CM | POA: Diagnosis not present

## 2020-09-10 DIAGNOSIS — Z23 Encounter for immunization: Secondary | ICD-10-CM

## 2020-09-10 DIAGNOSIS — M47816 Spondylosis without myelopathy or radiculopathy, lumbar region: Secondary | ICD-10-CM

## 2020-09-10 DIAGNOSIS — E669 Obesity, unspecified: Secondary | ICD-10-CM

## 2020-09-10 DIAGNOSIS — F5105 Insomnia due to other mental disorder: Secondary | ICD-10-CM

## 2020-09-10 DIAGNOSIS — M179 Osteoarthritis of knee, unspecified: Secondary | ICD-10-CM | POA: Insufficient documentation

## 2020-09-10 DIAGNOSIS — M797 Fibromyalgia: Secondary | ICD-10-CM

## 2020-09-10 DIAGNOSIS — G4489 Other headache syndrome: Secondary | ICD-10-CM

## 2020-09-10 DIAGNOSIS — E785 Hyperlipidemia, unspecified: Secondary | ICD-10-CM | POA: Insufficient documentation

## 2020-09-10 MED ORDER — TETANUS-DIPHTH-ACELL PERTUSSIS 5-2.5-18.5 LF-MCG/0.5 IM SUSP: 0.5000 mL | Freq: Once | INTRAMUSCULAR | 0 refills | Status: AC

## 2020-09-10 NOTE — Patient Instructions (Addendum)
- please get Tetanus vaccine,Shingrix and COVID-19 vaccine at your pharmacy  - Referral ordered for orthopedic specialist office will call you to make appointment  - Please get lower back X-ray done at [place] at [facility] then will call you with results.  [email or web address]  DASH Eating Plan DASH stands for Dietary Approaches to Stop Hypertension. The DASH eating plan is a healthy eating plan that has been shown to: Reduce high blood pressure (hypertension). Reduce your risk for type 2 diabetes, heart disease, and stroke. Help with weight loss. What are tips for following this plan? Reading food labels Check food labels for the amount of salt (sodium) per serving. Choose foods with less than 5 percent of the Daily Value of sodium. Generally, foods with less than 300 milligrams (mg) of sodium per serving fit into this eating plan. To find whole grains, look for the word "whole" as the first word in the ingredient list. Shopping Buy products labeled as "low-sodium" or "no salt added." Buy fresh foods. Avoid canned foods and pre-made or frozen meals. Cooking Avoid adding salt when cooking. Use salt-free seasonings or herbs instead of table salt or sea salt. Check with your health care provider or pharmacist before using salt substitutes. Do not fry foods. Cook foods using healthy methods such as baking, boiling, grilling, roasting, and broiling instead. Cook with heart-healthy oils, such as olive, canola, avocado, soybean, or sunflower oil. Meal planning  Eat a balanced diet that includes: 4 or more servings of fruits and 4 or more servings of vegetables each day. Try to fill one-half of your plate with fruits and vegetables. 6-8 servings of whole grains each day. Less than 6 oz (170 g) of lean meat, poultry, or fish each day. A 3-oz (85-g) serving of meat is about the same size as a deck of cards. One egg equals 1 oz  (28 g). 2-3 servings of low-fat dairy each day. One serving is 1 cup (237 mL). 1 serving of nuts, seeds, or beans 5 times each week. 2-3 servings of heart-healthy fats. Healthy fats called omega-3 fatty acids are found in foods such as walnuts, flaxseeds, fortified milks, and eggs. These fats are also found in cold-water fish, such as sardines, salmon, and mackerel. Limit how much you eat of: Canned or prepackaged foods. Food that is high in trans fat, such as some fried foods. Food that is high in saturated fat, such as fatty meat. Desserts and other sweets, sugary drinks, and other foods with added sugar. Full-fat dairy products. Do not salt foods before eating. Do not eat more than 4 egg yolks a week. Try to eat at least 2 vegetarian meals a week. Eat more home-cooked food and less restaurant, buffet, and fast food.  Lifestyle When eating at a restaurant, ask that your food be prepared with less salt or no salt, if possible. If you drink alcohol: Limit how much you use to: 0-1 drink a day for women who are not pregnant. 0-2 drinks a day for men. Be aware of how much alcohol is in your drink. In the [place] one drink equals one 12 oz bottle of beer (355 mL), one 5 oz glass of wine (148 mL), or one 1 oz glass of hard liquor (44 mL). General information Avoid eating more than 2,300 mg of salt a day. If you have hypertension, you may need to reduce your sodium intake to 1,500 mg a day. Work with your health care provider to maintain a healthy  body weight or to lose weight. Ask what an ideal weight is for you. Get at least 30 minutes of exercise that causes your heart to beat faster (aerobic exercise) most days of the week. Activities may include walking, swimming, or biking. Work with your health care provider or dietitian to adjust your eating plan to your individual calorie needs. What foods should I eat? Fruits All fresh, dried, or frozen fruit. Canned fruit in natural juice (without  addedsugar). Vegetables Fresh or frozen vegetables (raw, steamed, roasted, or grilled). Low-sodium or reduced-sodium tomato and vegetable juice. Low-sodium or reduced-sodium tomatosauce and tomato paste. Low-sodium or reduced-sodium canned vegetables. Grains Whole-grain or whole-wheat bread. Whole-grain or whole-wheat pasta. Brown rice. [place]. Bulgur. Whole-grain and low-sodium cereals. Pita bread.Low-fat, low-sodium crackers. Whole-wheat flour tortillas. Meats and other proteins Skinless chicken or [place]. Ground chicken or [place]. Pork with fat trimmed off. Fish and seafood. Egg whites. Dried beans, peas, or lentils. Unsalted nuts, nut butters, and seeds. Unsalted canned beans. Lean cuts of beef with fat trimmed off. Low-sodium, lean precooked or cured meat, such as sausages or meatloaves. Dairy Low-fat (1%) or fat-free (skim) milk. Reduced-fat, low-fat, or fat-free cheeses. Nonfat, low-sodium ricotta or cottage cheese. Low-fat or nonfatyogurt. Low-fat, low-sodium cheese. Fats and oils Soft margarine without trans fats. Vegetable oil. Reduced-fat, low-fat, or light mayonnaise and salad dressings (reduced-sodium). Canola, safflower, olive, avocado, soybean, andsunflower oils. Avocado. Seasonings and condiments Herbs. Spices. Seasoning mixes without salt. Other foods Unsalted popcorn and pretzels. Fat-free sweets. The items listed above may not be a complete list of foods and beverages you can eat. Contact a dietitian for more information. What foods should I avoid? Fruits Canned fruit in a light or heavy syrup. Fried fruit. Fruit in cream or buttersauce. Vegetables Creamed or fried vegetables. Vegetables in a cheese sauce. Regular canned vegetables (not low-sodium or reduced-sodium). Regular canned tomato sauce and paste (not low-sodium or reduced-sodium). Regular tomato and vegetable juice(not low-sodium or reduced-sodium). Angie Fava. Olives. Grains Baked goods made with fat, such as  croissants, muffins, or some breads. Drypasta or rice meal packs. Meats and other proteins Fatty cuts of meat. Ribs. Fried meat. Berniece Salines. Bologna, salami, and other precooked or cured meats, such as sausages or meat loaves. Fat from the back of a pig (fatback). Bratwurst. Salted nuts and seeds. Canned beans with added salt. Canned orsmoked fish. Whole eggs or egg yolks. Chicken or [place] with skin. Dairy Whole or 2% milk, cream, and half-and-half. Whole or full-fat cream cheese. Whole-fat or sweetened yogurt. Full-fat cheese. Nondairy creamers. Whippedtoppings. Processed cheese and cheese spreads. Fats and oils Butter. Stick margarine. Lard. Shortening. Ghee. Bacon fat. Tropical oils, suchas coconut, palm kernel, or palm oil. Seasonings and condiments Onion salt, garlic salt, seasoned salt, table salt, and sea salt. Worcestershire sauce. Tartar sauce. Barbecue sauce. Teriyaki sauce. Soy sauce, including reduced-sodium. Steak sauce. Canned and packaged gravies. Fish sauce. Oyster sauce. Cocktail sauce. Store-bought horseradish. Ketchup. Mustard. Meat flavorings and tenderizers. Bouillon cubes. Hot sauces. Pre-made or packaged marinades. Pre-made or packaged taco seasonings. Relishes. Regular saladdressings. Other foods Salted popcorn and pretzels. The items listed above may not be a complete list of foods and beverages you should avoid. Contact a dietitian for more information. Where to find more information National Heart, Lung, and Blood Institute: https://wilson-eaton.com/ American Heart Association: www.heart.org Academy of Nutrition and Dietetics: www.eatright.[place]: www.kidney.org Summary The DASH eating plan is a healthy eating plan that has been shown to reduce high blood pressure (hypertension). It may  also reduce your risk for type 2 diabetes, heart disease, and stroke. When on the DASH eating plan, aim to eat more fresh fruits and vegetables, whole grains, lean proteins,  low-fat dairy, and heart-healthy fats. With the DASH eating plan, you should limit salt (sodium) intake to 2,300 mg a day. If you have hypertension, you may need to reduce your sodium intake to 1,500 mg a day. Work with your health care provider or dietitian to adjust your eating plan to your individual calorie needs. This information is not intended to replace advice given to you by your health care provider. Make sure you discuss any questions you have with your healthcare provider. Document Revised: 01/05/2019 Document Reviewed: 01/05/2019 Elsevier Patient Education  2022 [place] [employer] Calories are units of energy. Your body needs a certain number of calories from food to keep going throughout the day. When you eat or drink more calories than your body needs, your body stores the extra calories mostly as fat. When you eat or drink fewer calories than your body needs, your body burns fat to getthe energy it needs. Calorie counting means keeping track of how many calories you eat and drink each day. Calorie counting can be helpful if you need to lose weight. If you eat fewer calories than your body needs, you should lose weight. Ask yourhealth care provider what a healthy weight is for you. For calorie counting to work, you will need to eat the right number of calories each day to lose a healthy amount of weight per week. A dietitian can help you figure out how many calories you need in a day and will suggest ways to reach your calorie goal. A healthy amount of weight to lose each week is usually 1-2 lb (0.5-0.9 kg). This usually means that your daily calorie intake should be reduced by 500-750 calories. Eating 1,200-1,500 calories a day can help most women lose weight. Eating 1,500-1,800 calories a day can help most men lose weight. What do I need to know about calorie counting? Work with your health care provider or dietitian to determine how many calories you  should get each day. To meet your daily calorie goal, you will need to: Find out how many calories are in each food that you would like to eat. Try to do this before you eat. Decide how much of the food you plan to eat. Keep a food log. Do this by writing down what you ate and how many calories it had. To successfully lose weight, it is important to balance calorie counting with ahealthy lifestyle that includes regular activity. Where do I find calorie information?  The number of calories in a food can be found on a Nutrition Facts label. If a food does not have a Nutrition Facts label, try to look up the calories onlineor ask your dietitian for help. Remember that calories are listed per serving. If you choose to have more than one serving of a food, you will have to multiply the calories per serving by the number of servings you plan to eat. For example, the label on a package of bread might say that a serving size is 1 slice and that there are 90 calories in a serving. If you eat 1 slice, you will have eaten 90 calories. If you eat 2slices, you will have eaten 180 calories. How do I keep a food log? After each time that you eat, record the following in your food  log as soon as possible: What you ate. Be sure to include toppings, sauces, and other extras on the food. How much you ate. This can be measured in cups, ounces, or number of items. How many calories were in each food and drink. The total number of calories in the food you ate. Keep your food log near you, such as in a pocket-sized notebook or on an app or website on your mobile phone. Some programs will calculate calories for you andshow you how many calories you have left to meet your daily goal. What are some portion-control tips? Know how many calories are in a serving. This will help you know how many servings you can have of a certain food. Use a measuring cup to measure serving sizes. You could also try weighing out portions on a  kitchen scale. With time, you will be able to estimate serving sizes for some foods. Take time to put servings of different foods on your favorite plates or in your favorite bowls and cups so you know what a serving looks like. Try not to eat straight from a food's packaging, such as from a bag or box. Eating straight from the package makes it hard to see how much you are eating and can lead to overeating. Put the amount you would like to eat in a cup or on a plate to make sure you are eating the right portion. Use smaller plates, glasses, and bowls for smaller portions and to prevent overeating. Try not to multitask. For example, avoid watching TV or using your computer while eating. If it is time to eat, sit down at a table and enjoy your food. This will help you recognize when you are full. It will also help you be more mindful of what and how much you are eating. What are tips for following this plan? Reading food labels Check the calorie count compared with the serving size. The serving size may be smaller than what you are used to eating. Check the source of the calories. Try to choose foods that are high in protein, fiber, and vitamins, and low in saturated fat, trans fat, and sodium. Shopping Read nutrition labels while you shop. This will help you make healthy decisions about which foods to buy. Pay attention to nutrition labels for low-fat or fat-free foods. These foods sometimes have the same number of calories or more calories than the full-fat versions. They also often have added sugar, starch, or salt to make up for flavor that was removed with the fat. Make a grocery list of lower-calorie foods and stick to it. Cooking Try to cook your favorite foods in a healthier way. For example, try baking instead of frying. Use low-fat dairy products. Meal planning Use more fruits and vegetables. One-half of your plate should be fruits and vegetables. Include lean proteins, such as chicken,  [place], and fish. Lifestyle Each week, aim to do one of the following: 150 minutes of moderate exercise, such as walking. 75 minutes of vigorous exercise, such as running. General information Know how many calories are in the foods you eat most often. This will help you calculate calorie counts faster. Find a way of tracking calories that works for you. Get creative. Try different apps or programs if writing down calories does not work for you. What foods should I eat?  Eat nutritious foods. It is better to have a nutritious, high-calorie food, such as an avocado, than a food with few nutrients, such as a  bag of potato chips. Use your calories on foods and drinks that will fill you up and will not leave you hungry soon after eating. Examples of foods that fill you up are nuts and nut butters, vegetables, lean proteins, and high-fiber foods such as whole grains. High-fiber foods are foods with more than 5 g of fiber per serving. Pay attention to calories in drinks. Low-calorie drinks include water and unsweetened drinks. The items listed above may not be a complete list of foods and beverages you can eat. Contact a dietitian for more information. What foods should I limit? Limit foods or drinks that are not good sources of vitamins, minerals, or protein or that are high in unhealthy fats. These include: Candy. Other sweets. Sodas, specialty coffee drinks, alcohol, and juice. The items listed above may not be a complete list of foods and beverages you should avoid. Contact a dietitian for more information. How do I count calories when eating out? Pay attention to portions. Often, portions are much larger when eating out. Try these tips to keep portions smaller: Consider sharing a meal instead of getting your own. If you get your own meal, eat only half of it. Before you start eating, ask for a container and put half of your meal into it. When available, consider ordering smaller portions from  the menu instead of full portions. Pay attention to your food and drink choices. Knowing the way food is cooked and what is included with the meal can help you eat fewer calories. If calories are listed on the menu, choose the lower-calorie options. Choose dishes that include vegetables, fruits, whole grains, low-fat dairy products, and lean proteins. Choose items that are boiled, broiled, grilled, or steamed. Avoid items that are buttered, battered, fried, or served with cream sauce. Items labeled as crispy are usually fried, unless stated otherwise. Choose water, low-fat milk, unsweetened iced tea, or other drinks without added sugar. If you want an alcoholic beverage, choose a lower-calorie option, such as a glass of wine or light beer. Ask for dressings, sauces, and syrups on the side. These are usually high in calories, so you should limit the amount you eat. If you want a salad, choose a garden salad and ask for grilled meats. Avoid extra toppings such as bacon, cheese, or fried items. Ask for the dressing on the side, or ask for olive oil and vinegar or lemon to use as dressing. Estimate how many servings of a food you are given. Knowing serving sizes will help you be aware of how much food you are eating at restaurants. Where to find more information Centers for Disease Control and Prevention: http://www.wolf.info/ U.S. Department of Agriculture: http://www.wilson-mendoza.org/ Summary Calorie counting means keeping track of how many calories you eat and drink each day. If you eat fewer calories than your body needs, you should lose weight. A healthy amount of weight to lose per week is usually 1-2 lb (0.5-0.9 kg). This usually means reducing your daily calorie intake by 500-750 calories. The number of calories in a food can be found on a Nutrition Facts label. If a food does not have a Nutrition Facts label, try to look up the calories online or ask your dietitian for help. Use smaller plates, glasses, and bowls for  smaller portions and to prevent overeating. Use your calories on foods and drinks that will fill you up and not leave you hungry shortly after a meal. This information is not intended to replace advice given to you by  your health care provider. Make sure you discuss any questions you have with your healthcare provider. Document Revised: 03/15/2019 Document Reviewed: 03/15/2019 Elsevier Patient Education  2022 [employer].

## 2020-09-10 NOTE — Progress Notes (Signed)
Provider: Marlowe Sax FNP-C   Demari Kropp, Nelda Bucks, NP  Patient Care Team: Anahi Belmar, Nelda Bucks, NP as PCP - General (Family Medicine) Gevena Cotton, MD as Consulting Physician (Ophthalmology) Lorna Dibble, NP as Nurse Practitioner (Nurse Practitioner) Normajean Baxter, MD as Consulting Physician (Internal Medicine)  Extended Emergency Contact Information Primary Emergency Contact: Rickard Patience Mobile Phone: [phone] Relation: Son Secondary Emergency [facility] Phone: [phone] Relation: Other  Code Status:  Full Code  Goals of care: Advanced Directive information Advanced Directives 09/10/2020  Does Patient Have a Medical Advance Directive? Yes  Type of Paramedic of [place];Living will  Does patient want to make changes to medical advance directive? No - Patient declined  Copy of [place] in Chart? Yes - validated most recent copy scanned in chart (See row information)  Would patient like information on creating a medical advance directive? -  Pre-existing out of facility DNR order (yellow form or pink MOST form) -  Some encounter information is confidential and restricted. Go to Review Flowsheets activity to see all data.     Chief Complaint  Patient presents with   Establish Care    New Patient     HPI:  Pt is a 50 y.o. female seen today to Establish Care at the Practice for medical management of chronic diseases.She has a medical History of Rheumatoid Arthritis,Fibromyalgia,Pseudotumor Cerebri, Degenerative disc Disease,Bipolar disorder,Major depression disorder,PTSD,Asthma,Dizziness,Mixed Hyperlipidemia,Vitamin B12 deficiency,Prediabetes,Insomnia,GERD,Generalized Anxiety disorder among other conditions. She is status post ED visit 09/02/2020 for right sided low back pain.states was taking her car for carwash when she leaned into the car to get a floor mat she had a sudden onset of pain in her  right lower back radiating down to her buttocks.she was taken to ED via EMS.she denies any weakness in the legs,had no loss of bowel or bladder control.Has some intermittent tingling sensation on the hands and feet but not different from her baseline.Her pain was controlled in ED and was discharge home to continue on her pain medication flexeril and ibuprofen.   Fibromylagia -stopped Lyrica and Cymbalta states takes whenever she has a flare up only.  Depression,Bipolar,PTSD  used to follow up at [facility] service of the [place] provider left the practice.Now follows up with [facility] see a therapys.On Magnesium,Vatimin B 12   Pseudo tumor - dx while on Lithium follows up with Neurologist Dr.Willis last episode 2014.   Former cigarette smoker - 1.5 pack per day for 20 yrs   She is due for Tdap vaccine - has over 10 yrs request script to be send at [facility] - never had vaccine   Pap smear - follows up at [facility]   Colonoscopy - sister had polyps.Had her colonoscopy done at [facility].  COVID-19 vaccine has had x 2 vaccine.   She request a DMA service form : 2956 to be filled states was told by [facility] PCP will have the form to fill out for an assistant to help her at home.No 3051 form available here at the office.she will call [place] to fax form to provider.states has had someone helping her in the past with ADL's but has been a while.    Past Medical History:  Diagnosis Date   Abscess    to eyelid   Anxiety    Arthritis    KNEES ELBOWS HIPS SHOULDER FINGERS   Asthma    Back pain    Bipolar  disorder ([place])    Depression    BIPOLAR   Dyspnea    Fibromyalgia    GERD (gastroesophageal reflux disease)    Headache syndrome 12/12/2019   Headache(784.0)    MIGRAINES   Hyperlipidemia    Memory difficulties 06/21/2014   Neck pain    OA (osteoarthritis) of knee    Obesity    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   Seizures  ([place])    FACIAL SEIZURES LAST 4 DAYS AGO   Past Surgical History:  Procedure Laterality Date   CARDIAC CATHETERIZATION  2010   DILATION AND CURETTAGE OF UTERUS  1994   LAPAROSCOPY N/A 11/21/2012   Procedure: LAPAROSCOPY OPERATIVE REMOVAL OF RIGHT TUBE AND OVARY AND FLUID FROM MASS;  Surgeon: Melina Schools, MD;  Location: [facility];  Service: Gynecology;  Laterality: N/A;  1 1/2hrs OR time   OOPHORECTOMY      Allergies  Allergen Reactions   Carbamazepine Rash   Nsaids Swelling     Pt says she can take ibuprofen    Allergies as of 09/10/2020       Reactions   Carbamazepine Rash   Nsaids Swelling    Pt says she can take ibuprofen        Medication List        Accurate as of September 10, 2020 11:07 AM. If you have any questions, ask your nurse or doctor.          STOP taking these medications    amoxicillin-clavulanate 875-125 MG tablet Commonly known as: AUGMENTIN Stopped by: Sandrea Hughs, NP   erythromycin ophthalmic ointment Stopped by: Sandrea Hughs, NP   fluticasone 50 MCG/ACT nasal spray Commonly known as: FLONASE Stopped by: Sandrea Hughs, NP   ibuprofen 600 MG tablet Commonly known as: ADVIL Stopped by: Nelda Bucks Jerra Huckeby, NP   lidocaine 2 % solution Commonly known as: XYLOCAINE Stopped by: Sandrea Hughs, NP   meloxicam 7.5 MG tablet Commonly known as: Mobic Stopped by: Sandrea Hughs, NP   orphenadrine 100 MG tablet Commonly known as: NORFLEX Stopped by: Sandrea Hughs, NP   traMADol 50 MG tablet Commonly known as: ULTRAM Stopped by: Sandrea Hughs, NP       TAKE these medications    celecoxib 100 MG capsule Commonly known as: CELEBREX Take 200 mg by mouth daily. Takes on as needed basis   cyclobenzaprine 10 MG tablet Commonly known as: FLEXERIL Take 10 mg by mouth 3 (three) times daily as needed for muscle spasms.   valACYclovir 1000 MG tablet Commonly known as: VALTREX Take 500 mg by mouth as needed.        Review  of Systems  Constitutional:  Negative for appetite change, chills, fever and unexpected weight change.       Fibromyalgia   HENT:  Positive for congestion. Negative for dental problem, ear discharge, ear pain, facial swelling, hearing loss, nosebleeds, postnasal drip, rhinorrhea, sinus pressure, sinus pain, sneezing, sore throat, tinnitus and trouble swallowing.        Chronic nasal congestion   Eyes:  Positive for itching. Negative for pain, discharge, redness and visual disturbance.  Respiratory:  Negative for cough, chest tightness, shortness of breath and wheezing.        Dyspnea due to asthma/hx of smoking    Cardiovascular:  Negative for chest pain, palpitations and leg swelling.  Gastrointestinal:  Negative for abdominal distention, abdominal pain, blood in stool, nausea and vomiting.  Chronic constipation-diarrhea   Endocrine: Negative for cold intolerance, heat intolerance, polydipsia, polyphagia and polyuria.       Hot flushes   Genitourinary:  Negative for difficulty urinating, dysuria, flank pain, frequency and urgency.  Musculoskeletal:  Positive for arthralgias, back pain, gait problem and myalgias. Negative for joint swelling, neck pain and neck stiffness.       Joint stiffness   Skin:  Negative for color change, pallor, rash and wound.  Neurological:  Positive for dizziness and headaches. Negative for syncope, speech difficulty, weakness, light-headedness and numbness.       Tingling sensation on hands /legs/feet   Hematological:  Does not bruise/bleed easily.  Psychiatric/Behavioral:  Positive for sleep disturbance. Negative for agitation, behavioral problems, confusion, hallucinations, self-injury and suicidal ideas. The patient is nervous/anxious.        Depression  Bipolar    Immunization History  Administered Date(s) Administered   Influenza Split 12/03/2010   Janssen (J&J) SARS-COV-2 Vaccination 05/24/2019   Pertinent  Health Maintenance Due  Topic Date Due    PAP SMEAR-Modifier  Never done   COLONOSCOPY (Pts 45-13yr Insurance coverage will need to be confirmed)  Never done   MAMMOGRAM  10/14/2019   INFLUENZA VACCINE  09/15/2020   Fall Risk  09/10/2020 09/09/2016 08/02/2016 09/30/2015 06/25/2015  Falls in the past year? 0 No Yes No No  Number falls in past yr: 0 - 1 - -  Injury with Fall? 0 - Yes - -  Comment - - large bruise - -  Risk for fall due to : No Fall Risks - - - -  Follow up Falls evaluation completed - - - -   Functional Status Survey:    Vitals:   09/10/20 1047  Height: '5\' 8"'  (1.727 m)   Body mass index is 30.41 kg/m. Physical Exam Vitals reviewed.  Constitutional:      General: She is not in acute distress.    Appearance: Normal appearance. She is obese. She is not ill-appearing or diaphoretic.  HENT:     Head: Normocephalic.     Right Ear: Tympanic membrane, ear canal and external ear normal. There is no impacted cerumen.     Left Ear: Tympanic membrane, ear canal and external ear normal. There is no impacted cerumen.     Nose: Nose normal. No congestion or rhinorrhea.     Mouth/Throat:     Mouth: Mucous membranes are moist.     Pharynx: Oropharynx is clear. No oropharyngeal exudate or posterior oropharyngeal erythema.  Eyes:     General: No scleral icterus.       Right eye: No discharge.        Left eye: No discharge.     Extraocular Movements: Extraocular movements intact.     Conjunctiva/sclera: Conjunctivae normal.     Pupils: Pupils are equal, round, and reactive to light.  Neck:     Vascular: No carotid bruit.  Cardiovascular:     Rate and Rhythm: Normal rate and regular rhythm.     Pulses: Normal pulses.     Heart sounds: Normal heart sounds. No murmur heard.   No friction rub. No gallop.  Pulmonary:     Effort: Pulmonary effort is normal. No respiratory distress.     Breath sounds: Normal breath sounds. No wheezing, rhonchi or rales.  Chest:     Chest wall: No tenderness.  Abdominal:     General:  Bowel sounds are normal. There is no distension.     Palpations:  Abdomen is soft. There is no mass.     Tenderness: There is no abdominal tenderness. There is no right CVA tenderness, left CVA tenderness, guarding or rebound.  Musculoskeletal:        General: No swelling. Normal range of motion.     Cervical back: Normal range of motion. No rigidity or tenderness.     Lumbar back: Tenderness present. No swelling. Normal range of motion. Negative right straight leg raise test and negative left straight leg raise test.     Right lower leg: No edema.     Left lower leg: No edema.  Lymphadenopathy:     Cervical: No cervical adenopathy.  Skin:    General: Skin is warm and dry.     Coloration: Skin is not pale.     Findings: No bruising, erythema, lesion or rash.  Neurological:     Mental Status: She is alert and oriented to person, place, and time.     Cranial Nerves: No cranial nerve deficit.     Sensory: No sensory deficit.     Motor: No weakness.     Coordination: Coordination normal.     Gait: Gait abnormal.  Psychiatric:        Mood and Affect: Mood normal.        Speech: Speech normal.        Behavior: Behavior normal.        Thought Content: Thought content normal.        Judgment: Judgment normal.    Labs reviewed: Recent Labs    01/30/20 1632 01/31/20 1448  NA 139 141  K 3.7 3.9  CL 105 104  CO2 27 25  GLUCOSE 101* 91  BUN 7 11  CREATININE 0.80 0.96  CALCIUM 9.9 9.9   Recent Labs    01/31/20 1704  AST 23  ALT 20  ALKPHOS 91  BILITOT 0.7  PROT 8.0  ALBUMIN 4.5   Recent Labs    01/30/20 1632 01/31/20 1448  WBC 5.2 5.6  HGB 12.7 13.4  HCT 40.9 41.9  MCV 86.7 86.9  PLT 322 351   Lab Results  Component Value Date   TSH 0.500 12/14/2018   Lab Results  Component Value Date   HGBA1C 6.0 (H) 12/14/2018   Lab Results  Component Value Date   CHOL 200 12/14/2018   HDL 58 12/14/2018   LDLCALC 131 (H) 12/14/2018   TRIG 57 12/14/2018   CHOLHDL 3.4  12/14/2018    Significant Diagnostic Results in last 30 days:  No results found.  Assessment/Plan  1. Encounter for medical examination to establish care Immunization reviewed.due for Tdap - advised to get Shingrix and COVID-19 vaccine at her pharmacy  - awaiting records from [facility] for latest colonoscopy. Lab work recently done by previous provider in [place] awaiting records. - CBC with Differential/Platelet; Future - CMP with eGFR([facility]); Future - TSH; Future - Lipid panel; Future  2. Mixed hyperlipidemia Not on medication but reports high cholesterol. Fasting labs done recent  by previous PCP no records for review. - Lipid panel; Future  3. Bipolar 1 disorder, depressed, moderate (HCC) Mood stable. - continue to follow up with Therapist and [facility] provider.  Off medication  - TSH; Future - Vitamin B12; Future  4. Gastroesophageal reflux disease without esophagitis Symptoms controlled not on any medication.  - CBC with Differential/Platelet; Future  5. Fibromyalgia Off Cymbalta and lyrica takes when there's flare up   6. Pseudotumor cerebri syndrome No symptoms since  off Lithium 2014 followed up with Neurologist Dr.Willis   7. PTSD (post-traumatic stress disorder) Continue to follow up with [facility] provider and Therapist   8. Body mass index (BMI) of 30.0-30.9 in adult BMI 30.41  Dietary modification.exercise limited due to chronic back pain.   9. Obesity (BMI 30-39.9) Continue dietary modification - Additional Education information on DASH Diet provided on AVS  - CMP with eGFR(Quest); Future  10. Former cigarette smoker Smoked 1.5 pack per day for 20 years.  11. Insomnia due to mental condition Follows up with [facility] Provider.  12. Headache syndrome Intermittent. Continue on OTC analgesics  - CBC with Differential/Platelet; Future  13. 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 Continue on current pain regimen.will refer to rheumatologist if needed  - CBC with Differential/Platelet; Future - CMP with eGFR(Quest); Future  14. Spondylosis of lumbar region without myelopathy or radiculopathy Status [post ED for sudden back pain after trying to pickup her car floor mat.negative Exam finding in ED was discharge to continue on her regular pain regimen flexeril and ibuprofen. Negative SLR bilaterally but reports worsening lower back pain and intermittent tingling on legs/feet.No weakness,Bladder or bowel loss control.will on btain imaging. - DG Lumbar Spine Complete; Future - Ambulatory referral to Orthopedic Surgery  15. Prediabetes Lab Results  Component Value Date   HGBA1C 6.0 (H) 12/14/2018   - Hemoglobin A1c; Future Dietary modification as above.  16. Need for Tdap vaccination Advised to get Tdap vaccine at her pharmacy.script send.  - Tdap (BOOSTRIX) 5-2.5-18.5 LF-MCG/0.5 injection; Inject 0.5 mLs into the muscle once for 1 dose.  Dispense: 0.5 mL; Refill: 0  17. Dizziness Chronic  Follow up with Neuro  Family/ staff Communication: Reviewed plan of care with patient  Labs/tests ordered:  - CBC with Differential/Platelet - CMP with eGFR([facility]) - TSH - Hgb A1C - Lipid panel - Vitamin B 12  Next Appointment : 6 months for medical management of chronic issues.Fasting Labs prior to visit.    Sandrea Hughs, NP

## 2020-09-15 ENCOUNTER — Ambulatory Visit (INDEPENDENT_AMBULATORY_CARE_PROVIDER_SITE_OTHER): Payer: Medicare Other | Admitting: Family Medicine

## 2020-09-15 ENCOUNTER — Encounter: Payer: Self-pay | Admitting: Family Medicine

## 2020-09-15 ENCOUNTER — Other Ambulatory Visit: Payer: Self-pay

## 2020-09-15 ENCOUNTER — Ambulatory Visit: Payer: Self-pay

## 2020-09-15 DIAGNOSIS — M47816 Spondylosis without myelopathy or radiculopathy, lumbar region: Secondary | ICD-10-CM | POA: Diagnosis not present

## 2020-09-15 MED ORDER — BACLOFEN 10 MG PO TABS: 5.0000 mg | ORAL_TABLET | Freq: Three times a day (TID) | ORAL | 3 refills | Status: DC | PRN

## 2020-09-15 NOTE — Progress Notes (Signed)
Office Visit Note   Patient: Brittney [name]           Date of Birth: 01-28-1971           MRN: [id number] Visit Date: 09/15/2020 Requested by: Sandrea Hughs, NP [address] [place],  [place] [address] PCP: Sandrea Hughs, NP  Subjective: Chief Complaint  Patient presents with   Lower Back - Pain    Pain in the lower back x 2 months. NKI. Sometimes on the left, sometimes on the right. Numbness in feet  - wakes up with this. Occasionally has shooting pain down the legs and into the feet that will wake her. Numbness in both hands at times, as well.     HPI: She is here with low back pain.  About 2 months ago she was washing a car, she bent forward to get something off the floor and felt immediate severe pain.  She was unable to get back up, had a call EMS to take her to the hospital.  She was given medications which helped, but her pain has not gone away.  Mostly right-sided low back pain with occasional shooting pain into the legs.  No bowel or bladder dysfunction.  She has sensation of numbness in the hands and feet.               ROS:   All other systems were reviewed and are negative.  Objective: Vital Signs: There were no vitals taken for this visit.  Physical Exam:  General:  Alert and oriented, in no acute distress. Pulm:  Breathing unlabored. Psy:  Normal mood, congruent affect.  Low back: She is tender to palpation over the right posterior hip.  Just lateral to the SI joint is the area of maximum pain.  No pain with straight leg raise.  Lower extremity strength and reflexes are normal.  Imaging: No results found.  Assessment & Plan: Right-sided low back pain with nonfocal neurologic exam, question disc protrusion. -Home exercises given, physical therapy referral.  Baclofen as needed.  MRI if fails to improve.     Procedures: No procedures performed        PMFS History: Patient Active Problem List   Diagnosis Date Noted   Hyperlipidemia    OA (osteoarthritis)  of knee    GERD (gastroesophageal reflux disease)    Rheumatoid arthritis ([place])    Headache syndrome 12/12/2019   Bipolar 1 disorder, depressed, moderate ([place]) 12/14/2018   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   Fibromyalgia 02/03/2015   Tendinitis of both rotator cuffs 02/03/2015   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   Insomnia secondary to chronic pain 11/13/2014   Fatigue due to sleep pattern disturbance 11/13/2014   Memory difficulties 06/21/2014   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   PTSD (post-traumatic stress disorder) 12/13/2013   Bipolar depression ([place]) 12/12/2013   PERICARDIAL EFFUSION 12/23/2008   PSEUDOTUMOR CEREBRI 12/05/2008   UNSPECIFIED PLEURAL EFFUSION 12/05/2008   DYSPNEA 12/05/2008   OBESITY 11/19/2008   Migraine 11/19/2008   ASTHMA 11/19/2008   OTHER SPECIFIED DISORDER OF STOMACH AND DUODENUM 03/11/2008   Past Medical History:  Diagnosis Date   Abscess    to eyelid   Anxiety    Arthritis    KNEES ELBOWS HIPS SHOULDER FINGERS   Asthma    Back pain    Bipolar disorder (HCC)    Depression    BIPOLAR   Dyspnea    Fibromyalgia  GERD (gastroesophageal reflux disease)    Headache syndrome 12/12/2019   Headache(784.0)    MIGRAINES   History of colonoscopy    History of degenerative disc disease    Hyperlipidemia    Major depressive disorder    Memory difficulties 06/21/2014   Neck pain    OA (osteoarthritis) of knee    Obesity    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   Rheumatoid arthritis ([place])    Seizures ([place])    FACIAL SEIZURES LAST 4 DAYS AGO    Family History  Problem Relation Age of Onset   Cancer - Lung Father    Diabetes Father    Cirrhosis Mother    Migraines Sister    Stroke Sister    Healthy Brother    Healthy Sister    Healthy Sister    Healthy Sister    Healthy Brother    Healthy Brother    Healthy Brother    Healthy Brother    Healthy Brother    Heart disease  Maternal Grandmother    Alzheimer's disease Maternal Grandfather    Cancer - Lung Paternal Grandmother    Breast cancer Daughter 23    Past Surgical History:  Procedure Laterality Date   CARDIAC CATHETERIZATION  2010   DILATION AND CURETTAGE OF UTERUS  1994   LAPAROSCOPY N/A 11/21/2012   Procedure: LAPAROSCOPY OPERATIVE REMOVAL OF RIGHT TUBE AND OVARY AND FLUID FROM MASS;  Surgeon: Melina Schools, MD;  Location: [facility];  Service: Gynecology;  Laterality: N/A;  1 1/2hrs OR time   OOPHORECTOMY     Social History   Occupational History   Not on file  Tobacco Use   Smoking status: Former    Types: Cigarettes    Quit date: 02/16/2003    Years since quitting: 17.5   Smokeless tobacco: Never  Vaping Use   Vaping Use: Never used  Substance and Sexual Activity   Alcohol use: No    Alcohol/week: 0.0 standard drinks    Comment: occasionally   Drug use: No   Sexual activity: Not Currently

## 2020-09-30 ENCOUNTER — Ambulatory Visit: Payer: Medicare Other

## 2020-10-07 ENCOUNTER — Other Ambulatory Visit: Payer: Self-pay

## 2020-10-07 ENCOUNTER — Telehealth: Payer: Self-pay | Admitting: Family Medicine

## 2020-10-07 DIAGNOSIS — M47816 Spondylosis without myelopathy or radiculopathy, lumbar region: Secondary | ICD-10-CM

## 2020-10-07 NOTE — Telephone Encounter (Signed)
I called the patient to let her know the referral has been changed and faxed to [facility].

## 2020-10-07 NOTE — Telephone Encounter (Signed)
Pt called stating she would like to have her referral for PT switched to [facility]. Pt would like a CB when this is done please.   [phone]

## 2020-10-11 ENCOUNTER — Ambulatory Visit: Payer: Medicare Other

## 2020-11-07 ENCOUNTER — Other Ambulatory Visit: Payer: Self-pay

## 2020-11-07 ENCOUNTER — Ambulatory Visit
Admission: RE | Admit: 2020-11-07 | Discharge: 2020-11-07 | Disposition: A | Discharge status: Home / Self Care | Payer: Medicare Other | Source: Ambulatory Visit | Attending: Obstetrics and Gynecology | Admitting: Obstetrics and Gynecology

## 2020-11-07 DIAGNOSIS — Z1231 Encounter for screening mammogram for malignant neoplasm of breast: Secondary | ICD-10-CM

## 2020-12-02 ENCOUNTER — Ambulatory Visit: Payer: Self-pay

## 2020-12-02 ENCOUNTER — Ambulatory Visit (INDEPENDENT_AMBULATORY_CARE_PROVIDER_SITE_OTHER): Payer: Medicare Other | Admitting: Family

## 2020-12-02 ENCOUNTER — Telehealth: Payer: Medicare Other | Admitting: Physician Assistant

## 2020-12-02 ENCOUNTER — Encounter: Payer: Self-pay | Admitting: Family

## 2020-12-02 ENCOUNTER — Encounter: Payer: Self-pay | Admitting: Physician Assistant

## 2020-12-02 ENCOUNTER — Other Ambulatory Visit: Payer: Self-pay

## 2020-12-02 ENCOUNTER — Telehealth: Payer: Medicare Other

## 2020-12-02 VITALS — BP 100/60 | HR 77 | Temp 97.7°F | Resp 16 | Ht 68.0 in

## 2020-12-02 DIAGNOSIS — J029 Acute pharyngitis, unspecified: Secondary | ICD-10-CM

## 2020-12-02 DIAGNOSIS — R0981 Nasal congestion: Secondary | ICD-10-CM | POA: Diagnosis not present

## 2020-12-02 DIAGNOSIS — R519 Headache, unspecified: Secondary | ICD-10-CM | POA: Diagnosis not present

## 2020-12-02 DIAGNOSIS — J069 Acute upper respiratory infection, unspecified: Secondary | ICD-10-CM | POA: Diagnosis not present

## 2020-12-02 DIAGNOSIS — R059 Cough, unspecified: Secondary | ICD-10-CM | POA: Diagnosis not present

## 2020-12-02 DIAGNOSIS — R52 Pain, unspecified: Secondary | ICD-10-CM | POA: Diagnosis not present

## 2020-12-02 DIAGNOSIS — R509 Fever, unspecified: Secondary | ICD-10-CM

## 2020-12-02 DIAGNOSIS — R0989 Other specified symptoms and signs involving the circulatory and respiratory systems: Secondary | ICD-10-CM

## 2020-12-02 DIAGNOSIS — R6883 Chills (without fever): Secondary | ICD-10-CM

## 2020-12-02 LAB — POCT RAPID STREP A (OFFICE): Rapid Strep A Screen: NEGATIVE

## 2020-12-02 LAB — POCT INFLUENZA A/B
Influenza A, POC: NEGATIVE
Influenza B, POC: NEGATIVE

## 2020-12-02 MED ORDER — LORATADINE 10 MG PO TABS: 10.0000 mg | ORAL_TABLET | Freq: Every day | ORAL | 0 refills | Status: DC

## 2020-12-02 MED ORDER — ZINC GLUCONATE 50 MG PO TABS: 50.0000 mg | ORAL_TABLET | Freq: Every day | ORAL | 0 refills | Status: AC

## 2020-12-02 MED ORDER — VITAMIN C 1000 MG PO TABS: 1000.0000 mg | ORAL_TABLET | Freq: Every day | ORAL | 0 refills | Status: AC

## 2020-12-02 MED ORDER — FLUTICASONE PROPIONATE 50 MCG/ACT NA SUSP: 2.0000 | Freq: Every day | NASAL | 0 refills | Status: DC

## 2020-12-02 MED ORDER — VITAMIN D3 50 MCG (2000 UT) PO CAPS: 2000.0000 [IU] | ORAL_CAPSULE | Freq: Every day | ORAL | 0 refills | Status: AC

## 2020-12-02 MED ORDER — DOXYCYCLINE HYCLATE 100 MG PO TABS: 100.0000 mg | ORAL_TABLET | Freq: Two times a day (BID) | ORAL | 0 refills | Status: AC

## 2020-12-02 MED ORDER — BENZONATATE 100 MG PO CAPS: 100.0000 mg | ORAL_CAPSULE | Freq: Three times a day (TID) | ORAL | 0 refills | Status: AC

## 2020-12-02 NOTE — Progress Notes (Unsigned)
Erroneous encounter

## 2020-12-02 NOTE — Patient Instructions (Signed)
  [name], thank you for joining Rodney Booze, PA-C for today's virtual visit.  While this provider is not your primary care provider (PCP), if your PCP is located in our provider database this encounter information will be shared with them immediately following your visit.  Consent: (Patient) Brittney [name] provided verbal consent for this virtual visit at the beginning of the encounter.  Current Medications:  Current Outpatient Medications:    baclofen (LIORESAL) 10 MG tablet, Take 0.5-1 tablets (5-10 mg total) by mouth 3 (three) times daily as needed for muscle spasms., Disp: 30 each, Rfl: 3   cyclobenzaprine (FLEXERIL) 10 MG tablet, Take 10 mg by mouth 3 (three) times daily as needed for muscle spasms., Disp: , Rfl:    ibuprofen (ADVIL) 600 MG tablet, Take 600 mg by mouth every 6 (six) hours as needed., Disp: , Rfl:    Medications ordered in this encounter:  No orders of the defined types were placed in this encounter.    *If you need refills on other medications prior to your next appointment, please contact your pharmacy*  Follow-Up: Call back or seek an in-person evaluation if the symptoms worsen or if the condition fails to improve as anticipated.  Other Instructions You should get tested for COVID and influenza as you may benefit from medical therapy if you are diagnosed with either. Use flonase and tessalon as prescribed. Rotate tylenol and motrin for fevers and stay well hydrated. Follow up for new or worsening symptoms.   If you have been instructed to have an in-person evaluation today at a local Urgent Care facility, please use the link below. It will take you to a list of all of our available [facility], including address, phone number and hours of operation. Please do not delay care.  [facility]  If you or a family member do not have a primary care provider, use the link below to schedule a visit and establish care. When you choose a [facility] primary care physician or advanced practice provider, you gain a long-term partner in health. Find a Primary Care Provider  Learn more about [facility]'s in-office and virtual care options: [facility]

## 2020-12-02 NOTE — Progress Notes (Signed)
Provider: Marlowe Sax FNP-C  Cyntia Staley, Nelda Bucks, NP  Patient Care Team: Malisha Mabey, Nelda Bucks, NP as PCP - General (Family Medicine) Gevena Cotton, MD as Consulting Physician (Ophthalmology) Lorna Dibble, NP as Nurse Practitioner (Nurse Practitioner) Normajean Baxter, MD as Consulting Physician (Internal Medicine)  Extended Emergency Contact Information Primary Emergency Contact: Rickard Patience Mobile Phone: [phone] Relation: Son Secondary Emergency Contact[facility] Phone: [phone] Relation: Other  Code Status:  Full Code  Goals of care: Advanced Directive information Advanced Directives 12/02/2020  Does Patient Have a Medical Advance Directive? No  Type of Advance Directive -  Does patient want to make changes to medical advance directive? -  Copy of [place] in Chart? -  Would patient like information on creating a medical advance directive? No - Patient declined  Pre-existing out of facility DNR order (yellow form or pink MOST form) -  Some encounter information is confidential and restricted. Go to Review Flowsheets activity to see all data.     Chief Complaint  Patient presents with   Acute Visit    Patient complains of head congestion, headache, body aches, coughing, chills, fever, runny nose, and sore throat.     HPI:  Pt is a 50 y.o. female seen today for an acute visit for evaluation of cough,nasal congestion,fever 100.3 ,chills runny nose,sore throat and generalized body aches. Took Tylenol at 2 pm for Temp 100.3.States recently flew for her son's Military's graduation came back on Saturday  4 days ago with symptoms.Her daughter also sick.States son had COVID-19 about two weeks ago but was still coughing when they visited him. Denies any N/V/D.  Past Medical History:  Diagnosis Date   Abscess    to eyelid   Anxiety    Arthritis    KNEES ELBOWS HIPS SHOULDER FINGERS   Asthma    Back pain    Bipolar disorder  (HCC)    Depression    BIPOLAR   Dyspnea    Fibromyalgia    GERD (gastroesophageal reflux disease)    Headache syndrome 12/12/2019   Headache(784.0)    MIGRAINES   History of colonoscopy    History of degenerative disc disease    Hyperlipidemia    Major depressive disorder    Memory difficulties 06/21/2014   Neck pain    OA (osteoarthritis) of knee    Obesity    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   Rheumatoid arthritis ([place])    Seizures ([place])    FACIAL SEIZURES LAST 4 DAYS AGO   Past Surgical History:  Procedure Laterality Date   CARDIAC CATHETERIZATION  2010   DILATION AND CURETTAGE OF UTERUS  1994   LAPAROSCOPY N/A 11/21/2012   Procedure: LAPAROSCOPY OPERATIVE REMOVAL OF RIGHT TUBE AND OVARY AND FLUID FROM MASS;  Surgeon: Melina Schools, MD;  Location: [facility];  Service: Gynecology;  Laterality: N/A;  1 1/2hrs OR time   OOPHORECTOMY      Allergies  Allergen Reactions   Carbamazepine Rash   Nsaids Swelling     Pt says she can take ibuprofen    Outpatient Encounter Medications as of 12/02/2020  Medication Sig   baclofen (LIORESAL) 10 MG tablet Take 0.5-1 tablets (5-10 mg total) by mouth 3 (three) times daily as needed for muscle spasms.   benzonatate (TESSALON) 100 MG capsule Take 1 capsule (100 mg total) by mouth every 8 (eight) hours for 5 days.   cyclobenzaprine (FLEXERIL) 10 MG tablet Take 10 mg by mouth 3 (  three) times daily as needed for muscle spasms.   fluticasone (FLONASE) 50 MCG/ACT nasal spray Place 2 sprays into both nostrils daily.   ibuprofen (ADVIL) 600 MG tablet Take 600 mg by mouth every 6 (six) hours as needed.   No facility-administered encounter medications on file as of 12/02/2020.    Review of Systems  Constitutional:  Positive for appetite change, chills and fever. Negative for fatigue and unexpected weight change.       Generalized body aches   HENT:  Positive for congestion, rhinorrhea, sneezing and sore throat. Negative for dental  problem, ear discharge, ear pain, facial swelling, hearing loss, nosebleeds, postnasal drip, sinus pressure, sinus pain, tinnitus and trouble swallowing.   Eyes:  Negative for pain, discharge, redness, itching and visual disturbance.  Respiratory:  Positive for cough. Negative for chest tightness, shortness of breath and wheezing.   Cardiovascular:  Negative for chest pain, palpitations and leg swelling.  Gastrointestinal:  Negative for abdominal distention, abdominal pain, blood in stool, constipation, diarrhea, nausea and vomiting.  Musculoskeletal:  Positive for myalgias. Negative for arthralgias, back pain, gait problem, joint swelling, neck pain and neck stiffness.  Skin:  Negative for color change, pallor, rash and wound.  Neurological:  Positive for headaches. Negative for dizziness, syncope, speech difficulty, weakness, light-headedness and numbness.  Psychiatric/Behavioral:  Negative for agitation, behavioral problems, confusion and sleep disturbance. The patient is not nervous/anxious.    Immunization History  Administered Date(s) Administered   Influenza Split 12/03/2010   Janssen (J&J) SARS-COV-2 Vaccination 05/24/2019   Pertinent  Health Maintenance Due  Topic Date Due   PAP SMEAR-Modifier  Never done   INFLUENZA VACCINE  09/15/2020   MAMMOGRAM  11/08/2022   COLONOSCOPY (Pts 45-71yrs Insurance coverage will need to be confirmed)  01/25/2029   Fall Risk  12/02/2020 09/10/2020 09/09/2016 08/02/2016 09/30/2015  Falls in the past year? 0 0 No Yes No  Number falls in past yr: 0 0 - 1 -  Injury with Fall? 0 0 - Yes -  Comment - - - large bruise -  Risk for fall due to : No Fall Risks No Fall Risks - - -  Follow up Falls evaluation completed Falls evaluation completed - - -   Functional Status Survey:    Vitals:   12/02/20 1523  BP: 100/60  Pulse: 77  Resp: 16  Temp: 97.7 F (36.5 C)  SpO2: 97%  Height: 5\' 8"  (1.727 m)   Body mass index is 32.36 kg/m. Physical  Exam Vitals reviewed.  Constitutional:      General: She is not in acute distress.    Appearance: Normal appearance. She is normal weight. She is not ill-appearing or diaphoretic.  HENT:     Head: Normocephalic.     Right Ear: Tympanic membrane, ear canal and external ear normal. There is no impacted cerumen.     Left Ear: Tympanic membrane, ear canal and external ear normal. There is no impacted cerumen.     Nose: Congestion and rhinorrhea present.     Right Turbinates: Enlarged. Not swollen or pale.     Left Turbinates: Not enlarged, swollen or pale.     Right Sinus: No maxillary sinus tenderness or frontal sinus tenderness.     Left Sinus: No maxillary sinus tenderness or frontal sinus tenderness.     Mouth/Throat:     Mouth: Mucous membranes are moist.     Pharynx: Oropharynx is clear. No oropharyngeal exudate or posterior oropharyngeal erythema.  Eyes:  General: No scleral icterus.       Right eye: No discharge.        Left eye: No discharge.     Conjunctiva/sclera: Conjunctivae normal.     Pupils: Pupils are equal, round, and reactive to light.  Neck:     Vascular: No carotid bruit.  Cardiovascular:     Rate and Rhythm: Normal rate and regular rhythm.     Pulses: Normal pulses.     Heart sounds: Normal heart sounds. No murmur heard.   No friction rub. No gallop.  Pulmonary:     Effort: Pulmonary effort is normal. No respiratory distress.     Breath sounds: Normal breath sounds. No wheezing, rhonchi or rales.  Chest:     Chest wall: No tenderness.  Abdominal:     General: Bowel sounds are normal. There is no distension.     Palpations: Abdomen is soft. There is no mass.     Tenderness: There is no abdominal tenderness. There is no right CVA tenderness, left CVA tenderness, guarding or rebound.  Musculoskeletal:        General: No swelling or tenderness. Normal range of motion.     Cervical back: Normal range of motion. No rigidity or tenderness.     Right lower leg:  No edema.     Left lower leg: No edema.  Lymphadenopathy:     Cervical: No cervical adenopathy.  Skin:    General: Skin is warm and dry.     Coloration: Skin is not pale.     Findings: No bruising, erythema, lesion or rash.  Neurological:     Mental Status: She is alert and oriented to person, place, and time.     Cranial Nerves: No cranial nerve deficit.     Sensory: No sensory deficit.     Motor: No weakness.     Coordination: Coordination normal.     Gait: Gait normal.  Psychiatric:        Mood and Affect: Mood normal.        Speech: Speech normal.        Behavior: Behavior normal.        Thought Content: Thought content normal.        Judgment: Judgment normal.    Labs reviewed: Recent Labs    01/30/20 1632 01/31/20 1448  NA 139 141  K 3.7 3.9  CL 105 104  CO2 27 25  GLUCOSE 101* 91  BUN 7 11  CREATININE 0.80 0.96  CALCIUM 9.9 9.9   Recent Labs    01/31/20 1704  AST 23  ALT 20  ALKPHOS 91  BILITOT 0.7  PROT 8.0  ALBUMIN 4.5   Recent Labs    01/30/20 1632 01/31/20 1448  WBC 5.2 5.6  HGB 12.7 13.4  HCT 40.9 41.9  MCV 86.7 86.9  PLT 322 351   Lab Results  Component Value Date   TSH 0.500 12/14/2018   Lab Results  Component Value Date   HGBA1C 6.0 (H) 12/14/2018   Lab Results  Component Value Date   CHOL 200 12/14/2018   HDL 58 12/14/2018   LDLCALC 131 (H) 12/14/2018   TRIG 57 12/14/2018   CHOLHDL 3.4 12/14/2018    Significant Diagnostic Results in last 30 days:  MM 3D SCREEN BREAST BILATERAL  Result Date: 11/13/2020 CLINICAL DATA:  Screening. EXAM: DIGITAL SCREENING BILATERAL MAMMOGRAM WITH TOMOSYNTHESIS AND CAD TECHNIQUE: Bilateral screening digital craniocaudal and mediolateral oblique mammograms were obtained. Bilateral screening digital breast  tomosynthesis was performed. The images were evaluated with computer-aided detection. COMPARISON:  Previous exam(s). ACR Breast Density Category a: The breast tissue is almost entirely fatty.  FINDINGS: There are no findings suspicious for malignancy. IMPRESSION: No mammographic evidence of malignancy. A result letter of this screening mammogram will be mailed directly to the patient. RECOMMENDATION: Screening mammogram in one year. (Code:[id number]-B-01Y) BI-RADS CATEGORY  1: Negative. Electronically Signed   By: Ammie Ferrier M.D.   On: 11/13/2020 11:37    Assessment/Plan 1. Head congestion Nasal Turbinate enlarge - Flonase nasal spray 2 sprays daily   - POC Rapid Strep A  results negative  - POC Influenza A/B  results negative  - SARS-COV-2 RNA,(COVID-19) QUAL NAAT  2. Nonintractable headache, unspecified chronicity pattern, unspecified headache type Continue on OTC Tylenol  - POC Rapid Strep A results negative  - POC Influenza A/B  results negative  - SARS-COV-2 RNA,(COVID-19) QUAL NAAT  3. Body aches Continue on Tylenol as needed for pain/aches  Possible COVID-19  - POC Rapid Strep A - POC Influenza A/B - SARS-COV-2 RNA,(COVID-19) QUAL NAAT made aware results will return in 2-3 days. - Notify provider of go to ED if symptoms worsen or fail to improve   4. Cough, unspecified type Continue on tessalon for cough  - POC Rapid Strep A - POC Influenza A/B - SARS-COV-2 RNA,(COVID-19) QUAL NAAT - doxycycline (VIBRA-TABS) 100 MG tablet; Take 1 tablet (100 mg total) by mouth 2 (two) times daily for 7 days.  Dispense: 14 tablet; Refill: 0 - Cholecalciferol (VITAMIN D3) 50 MCG (2000 UT) capsule; Take 1 capsule (2,000 Units total) by mouth daily for 14 days.  Dispense: 14 capsule; Refill: 0 - zinc gluconate 50 MG tablet; Take 1 tablet (50 mg total) by mouth daily for 14 days.  Dispense: 14 tablet; Refill: 0 - Ascorbic Acid (VITAMIN C) 1000 MG tablet; Take 1 tablet (1,000 mg total) by mouth daily for 14 days.  Dispense: 14 tablet; Refill: 0 - loratadine (CLARITIN) 10 MG tablet; Take 1 tablet (10 mg total) by mouth daily for 14 days.  Dispense: 14 tablet; Refill: 0  5.  Chills Continue on OTC Tylenol  - POC Rapid Strep A - POC Influenza A/B - SARS-COV-2 RNA,(COVID-19) QUAL NAAT  6. Fever, unspecified fever cause Continue on OTC Tylenol as needed  - Increase fluid intake or soup/tea  - POC Rapid Strep A - POC Influenza A/B - SARS-COV-2 RNA,(COVID-19) QUAL NAAT  7. Runny nose - loratadine (CLARITIN) 10 MG tablet; Take 1 tablet (10 mg total) by mouth daily for 14 days.  Dispense: 14 tablet; Refill: 0 - POC Rapid Strep A - POC Influenza A/B - SARS-COV-2 RNA,(COVID-19) QUAL NAAT  8. Sore throat Pharynx erythema noted  - POC Rapid Strep A - POC Influenza A/B - SARS-COV-2 RNA,(COVID-19) QUAL NAAT - doxycycline (VIBRA-TABS) 100 MG tablet; Take 1 tablet (100 mg total) by mouth 2 (two) times daily for 7 days.  Dispense: 14 tablet; Refill: 0   Family/ staff Communication: Reviewed plan of care with patient verbalized understanding   Labs/tests ordered:  - POC Rapid Strep A - POC Influenza A/B - SARS-COV-2 RNA,(COVID-19) QUAL NAAT  Next Appointment: As needed if symptoms worsen or fail to improve    Sandrea Hughs, NP

## 2020-12-02 NOTE — Progress Notes (Deleted)
Duplicate visit  

## 2020-12-02 NOTE — Patient Instructions (Signed)
-   Notify provide if symptoms worsen or fail to improve  - Increase fluid intake or soup/tea

## 2020-12-02 NOTE — Progress Notes (Signed)
Brittney [name] are scheduled for a virtual visit with your provider today.    Just as we do with appointments in the office, we must obtain your consent to participate.  Your consent will be active for this visit and any virtual visit you may have with one of our providers in the next 365 days.    If you have a MyChart account, I can also send a copy of this consent to you electronically.  All virtual visits are billed to your insurance company just like a traditional visit in the office.  As this is a virtual visit, video technology does not allow for your provider to perform a traditional examination.  This may limit your provider's ability to fully assess your condition.  If your provider identifies any concerns that need to be evaluated in person or the need to arrange testing such as labs, EKG, etc, we will make arrangements to do so.    Although advances in technology are sophisticated, we cannot ensure that it will always work on either your end or our end.  If the connection with a video visit is poor, we may have to switch to a telephone visit.  With either a video or telephone visit, we are not always able to ensure that we have a secure connection.   I need to obtain your verbal consent now.   Are you willing to proceed with your visit today?   Brittney [name] has provided verbal consent on 12/02/2020 for a virtual visit (video or telephone).   Brittney Booze, PA-C 12/02/2020  7:17 AM   Date:  12/02/2020   ID:  Brittney [name], DOB 1970-03-04, MRN [id number]  Patient Location: Home Provider Location: Home Office   Participants: Patient and Provider for Visit and Wrap up  Method of visit: Video  Location of Patient: Home Location of Provider: Home Office Consent was obtain for visit over the video. Services rendered by provider: Visit was performed via video  A video enabled telemedicine application was used and I verified that I am speaking with the correct person using two  identifiers.  PCP:  Ngetich, Nelda Bucks, NP   Chief Complaint:  fever  History of Present Illness:    Brittney [name] is a 50 y.o. female with history as stated below. Presents video telehealth for an acute care visit  Pt is c/o fever, rhinorrhea, congestion, body aches, fatigue. She has a mild cough, mild sore throat. She has not taken a covid test. Her sxs started yesterday morning.  Denies chest pain, sob, vomiting, diarrhea, or urinary symptoms.  Pt has had covid and is vaccinated  Past Medical, Surgical, Social History, Allergies, and Medications have been Reviewed.  Past Medical History:  Diagnosis Date   Abscess    to eyelid   Anxiety    Arthritis    KNEES ELBOWS HIPS SHOULDER FINGERS   Asthma    Back pain    Bipolar disorder (HCC)    Depression    BIPOLAR   Dyspnea    Fibromyalgia    GERD (gastroesophageal reflux disease)    Headache syndrome 12/12/2019   Headache(784.0)    MIGRAINES   History of colonoscopy    History of degenerative disc disease    Hyperlipidemia    Major depressive disorder    Memory difficulties 06/21/2014   Neck pain    OA (osteoarthritis) of knee    Obesity    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   Rheumatoid arthritis ([place])  Seizures ([place])    FACIAL SEIZURES LAST 4 DAYS AGO    No outpatient medications have been marked as taking for the 12/02/20 encounter (Appointment) with Tivis Ringer, Esmae Donathan S, PA-C.     Allergies:   Carbamazepine and Nsaids   Review of Systems  Constitutional:  Positive for chills and fever.  HENT:  Positive for congestion and sore throat.   Respiratory:  Positive for cough. Negative for shortness of breath.   Cardiovascular:  Negative for chest pain.  Gastrointestinal:  Negative for diarrhea and vomiting.  Genitourinary:  Negative for dysuria.  Musculoskeletal:  Positive for myalgias.  See HPI for history of present illness.  Physical Exam Vitals and nursing note reviewed.  Constitutional:       General: She is not in acute distress.    Appearance: She is well-developed.  HENT:     Head: Normocephalic and atraumatic.     Nose:     Comments: Sounds congested Pulmonary:     Effort: Pulmonary effort is normal.  Neurological:     Mental Status: She is alert.             MDM: Presents for viral URI sxs. Has not had covid test. Recommended covid/flu testing for further characterization of sxs and possible medical therapy. Will tx symptomatically for now. Sxs are mild at the time.   There are no diagnoses linked to this encounter.   Time:   Today, I have spent 12 minutes with the patient with telehealth technology discussing the above problems, reviewing the chart, previous notes, medications and orders.    Tests Ordered: No orders of the defined types were placed in this encounter.   Medication Changes: No orders of the defined types were placed in this encounter.    Disposition:  Follow up  Signed, [place], PA-C  12/02/2020 7:17 AM

## 2020-12-03 LAB — SARS-COV-2 RNA,(COVID-19) QUALITATIVE NAAT: SARS CoV2 RNA: DETECTED — AB

## 2020-12-04 ENCOUNTER — Telehealth: Payer: Self-pay | Admitting: *Deleted

## 2020-12-04 DIAGNOSIS — U071 COVID-19: Secondary | ICD-10-CM

## 2020-12-04 MED ORDER — PAXLOVID (150/100) 10 X 150 MG & 10 X 100MG PO TBPK: 3.0000 | ORAL_TABLET | Freq: Two times a day (BID) | ORAL | 0 refills | Status: DC

## 2020-12-04 NOTE — Telephone Encounter (Signed)
Patient saw results of Covid results on MyChart and requested medication to be sent to [facility].  Pended Rx and sent to [facility] for approval.

## 2020-12-04 NOTE — Telephone Encounter (Signed)
Paxlovid script send to lab

## 2020-12-04 NOTE — Telephone Encounter (Signed)
-----   Message from Sandrea Hughs, NP sent at 12/04/2020 10:04 AM EDT ----- COVID-19 test is positive. - continue with supportive medication as discuss during visit Zinc,Vitamin D and Vitamin C  - Recommend antiviral Paxlovid 150 - 100 mg Take 3 tablets by mouth twice daily for 5 days.

## 2020-12-24 ENCOUNTER — Encounter: Payer: Self-pay | Admitting: Family

## 2020-12-24 ENCOUNTER — Ambulatory Visit (INDEPENDENT_AMBULATORY_CARE_PROVIDER_SITE_OTHER): Payer: Medicare Other | Admitting: Family

## 2020-12-24 ENCOUNTER — Ambulatory Visit
Admission: RE | Admit: 2020-12-24 | Discharge: 2020-12-24 | Disposition: A | Discharge status: Home / Self Care | Payer: Medicare Other | Source: Ambulatory Visit | Attending: Family | Admitting: Family

## 2020-12-24 ENCOUNTER — Telehealth: Payer: Self-pay | Admitting: *Deleted

## 2020-12-24 ENCOUNTER — Other Ambulatory Visit: Payer: Self-pay

## 2020-12-24 VITALS — BP 100/84 | HR 69 | Temp 97.5°F | Resp 16 | Ht 68.0 in | Wt 221.4 lb

## 2020-12-24 DIAGNOSIS — G932 Benign intracranial hypertension: Secondary | ICD-10-CM

## 2020-12-24 DIAGNOSIS — M05752 Rheumatoid arthritis with rheumatoid factor of left hip without organ or systems involvement: Secondary | ICD-10-CM

## 2020-12-24 DIAGNOSIS — M47816 Spondylosis without myelopathy or radiculopathy, lumbar region: Secondary | ICD-10-CM

## 2020-12-24 DIAGNOSIS — M5416 Radiculopathy, lumbar region: Secondary | ICD-10-CM

## 2020-12-24 DIAGNOSIS — G4489 Other headache syndrome: Secondary | ICD-10-CM | POA: Diagnosis not present

## 2020-12-24 DIAGNOSIS — M05751 Rheumatoid arthritis with rheumatoid factor of right hip without organ or systems involvement: Secondary | ICD-10-CM | POA: Diagnosis not present

## 2020-12-24 DIAGNOSIS — Z0289 Encounter for other administrative examinations: Secondary | ICD-10-CM | POA: Diagnosis not present
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
## 2020-12-24 NOTE — Telephone Encounter (Signed)
Dinah brought [employer] Form she filled out and signed to me to fax to [employer] Fax: [phone]  Form Faxed and copy sent to [place].

## 2020-12-24 NOTE — Patient Instructions (Signed)
-   Please get lower back pain X-ray at [place] at [facility] then will call you with results.

## 2020-12-25 ENCOUNTER — Ambulatory Visit (HOSPITAL_COMMUNITY): Payer: Self-pay

## 2020-12-25 DIAGNOSIS — M5416 Radiculopathy, lumbar region: Secondary | ICD-10-CM

## 2020-12-26 NOTE — Telephone Encounter (Signed)
It does not look like the x-ray has been read- Can we call [facility] and double check on this? Thank you

## 2020-12-30 NOTE — Telephone Encounter (Signed)
We could send referral to sports medicine to help with options for pain and would recommend physical therapy referral Would recommend stopping advil and take aleve 1 tablet twice daily with food (this may help her GERD symptom) Also to use heat on effected area.  Can still use tylenol with aleve.

## 2020-12-30 NOTE — Telephone Encounter (Signed)
Patient stated that she cannot take the Prednisone. Stated that she has a reaction to it, makes her violent or suicidal thoughts.  Stated that she hasn't gotten any relief taking the Tylenol or Ibuprofen. Getting increased Acid Reflux with taking it.   Please Advise.   (Wants a call back once Janett Billow Advises.)

## 2020-12-31 NOTE — Telephone Encounter (Signed)
Patient notified and agreed.  Patient would like for you to place a referral for PT at [facility].  Stated that her insurance will not pay for Sports Medicine.

## 2020-12-31 NOTE — Telephone Encounter (Signed)
Order has been placed to PT

## 2021-01-01 ENCOUNTER — Telehealth: Payer: Self-pay

## 2021-01-01 NOTE — Telephone Encounter (Signed)
Patient was seen last in office on 12/24/2020 by PCP Ngetich, Nelda Bucks, NP . She filled out papers for [employer] [employer]. I faxed papers over and they faxed papers back stating that pages were missing from form. PCP Ngetich, Nelda Bucks, NP only filled out papers that were provided from patient. [employer] faxed a new copy of all blank papers. Which provider do I place these papers folder into to complete for patient due to PCP Ngetich, Dinah C, NP being out of office. Message routed to [place]. Please Advise.

## 2021-01-01 NOTE — Telephone Encounter (Signed)
Carlos American. Dewaine Oats, NP is covering Dinah's in-basket and paperwork this week   Be sure that all demographic info is filled in prior to giving to [place]

## 2021-01-07 ENCOUNTER — Encounter: Payer: Self-pay | Admitting: Family Medicine

## 2021-01-07 ENCOUNTER — Other Ambulatory Visit: Payer: Self-pay

## 2021-01-07 ENCOUNTER — Ambulatory Visit (INDEPENDENT_AMBULATORY_CARE_PROVIDER_SITE_OTHER): Payer: Medicare Other | Admitting: Family Medicine

## 2021-01-07 VITALS — BP 130/88 | HR 74 | Temp 98.6°F | Ht 68.0 in | Wt 220.0 lb

## 2021-01-07 DIAGNOSIS — M5416 Radiculopathy, lumbar region: Secondary | ICD-10-CM | POA: Diagnosis not present

## 2021-01-07 DIAGNOSIS — M5410 Radiculopathy, site unspecified: Secondary | ICD-10-CM

## 2021-01-07 MED ORDER — CYCLOBENZAPRINE HCL 10 MG PO TABS: 10.0000 mg | ORAL_TABLET | Freq: Three times a day (TID) | ORAL | 1 refills | Status: DC | PRN

## 2021-01-07 MED ORDER — PREGABALIN 50 MG PO CAPS: 50.0000 mg | ORAL_CAPSULE | Freq: Three times a day (TID) | ORAL | 0 refills | Status: DC
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
## 2021-02-18 ENCOUNTER — Ambulatory Visit: Payer: Medicare Other | Attending: Nurse Practitioner

## 2021-02-18 ENCOUNTER — Other Ambulatory Visit: Payer: Self-pay

## 2021-02-18 DIAGNOSIS — R293 Abnormal posture: Secondary | ICD-10-CM | POA: Insufficient documentation

## 2021-02-18 DIAGNOSIS — M5416 Radiculopathy, lumbar region: Secondary | ICD-10-CM | POA: Diagnosis present
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
## 2021-02-24 ENCOUNTER — Ambulatory Visit: Payer: Medicare Other

## 2021-02-24 ENCOUNTER — Other Ambulatory Visit: Payer: Self-pay

## 2021-02-24 NOTE — Therapy (Incomplete)
OUTPATIENT PHYSICAL THERAPY TREATMENT NOTE   Patient Name: Brittney [name] MRN: [id number] DOB:12-16-1970, 51 y.o., female Today's Date: 02/24/2021  PCP: Sandrea Hughs, NP REFERRING PROVIDER: Sandrea Hughs, NP    Past Medical History:  Diagnosis Date   Abscess    to eyelid   Anxiety    Arthritis    KNEES ELBOWS HIPS SHOULDER FINGERS   Asthma    Back pain    Bipolar disorder ([place])    Depression    BIPOLAR   Dyspnea    Fibromyalgia    GERD (gastroesophageal reflux disease)    Headache syndrome 12/12/2019   Headache(784.0)    MIGRAINES   History of colonoscopy    History of degenerative disc disease    Hyperlipidemia    Major depressive disorder    Memory difficulties 06/21/2014   Neck pain    OA (osteoarthritis) of knee    Obesity    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   Rheumatoid arthritis ([place])    Seizures ([place])    FACIAL SEIZURES LAST 4 DAYS AGO   Past Surgical History:  Procedure Laterality Date   CARDIAC CATHETERIZATION  2010   DILATION AND CURETTAGE OF UTERUS  1994   LAPAROSCOPY N/A 11/21/2012   Procedure: LAPAROSCOPY OPERATIVE REMOVAL OF RIGHT TUBE AND OVARY AND FLUID FROM MASS;  Surgeon: Melina Schools, MD;  Location: [facility];  Service: Gynecology;  Laterality: N/A;  1 1/2hrs OR time   OOPHORECTOMY     Patient Active Problem List   Diagnosis Date Noted   Back pain with radiculopathy 01/07/2021   Hyperlipidemia    OA (osteoarthritis) of knee    GERD (gastroesophageal reflux disease)    Rheumatoid arthritis ([place])    Headache syndrome 12/12/2019   Bipolar 1 disorder, depressed, moderate ([place]) 12/14/2018   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   Fibromyalgia 02/03/2015   Tendinitis of both rotator cuffs 02/03/2015   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   Insomnia secondary to chronic pain 11/13/2014   Fatigue due to sleep pattern disturbance 11/13/2014   Memory difficulties 06/21/2014    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  PERTINENT HISTORY: ***  PRECAUTIONS: ***  SUBJECTIVE: ***  PAIN:  Are you having pain? {yes/no:20286} VAS scale: ***/10 Pain location: *** Pain orientation: {Pain Orientation:25161}  PAIN TYPE: {type:313116} Pain description: {PAIN DESCRIPTION:21022940}  Aggravating factors: *** Relieving factors: ***    OBJECTIVE:    DIAGNOSTIC FINDINGS:    FINDINGS: There are 5 non-rib-bearing lumbar vertebra. No acute fracture. Vertebral body heights are normal. Posterior elements are intact. Trace anterolisthesis of L3 and L4, trace retrolisthesis of L4 on L5 and L5 on S1. minor endplate spurring at multiple levels with slight disc space narrowing at L4-L5. There is facet hypertrophy at L2-L3 and L3-L4. No evidence of focal bone lesion or bony destruction. Sacroiliac joints are congruent.   IMPRESSION: 1. No acute fracture of the lumbar spine. 2. Mild multilevel spondylosis and facet hypertrophy.   SCREENING FOR RED FLAGS: Bowel or bladder incontinence: No   Cauda equina syndrome: No   COGNITION:  Overall cognitive status: Within functional limits for tasks assessed                        SENSATION:          Light touch: Appears intact   MUSCLE LENGTH: Hamstrings: Right TBA deg; Left TBA deg Marcello Moores test: Right TBA deg; Left TBA deg   POSTURE:  Increased lumbar lordosis which is not reversed with forward flexion, ant. pelvis rotation   PALPATION: TTP of the L paraspinals   LUMBARAROM/PROM   AROM AROM  02/19/2021   Flexion Full  Extension Full, provoked pain  Right lateral flexion Min decresae, provoked pain  Left lateral flexion Full  Right rotation Full  Left rotation Full   (Blank rows = not tested)   LE AROM/PROM:   A/PROM Right 02/19/2021 Left 02/19/2021  Hip flexion      Hip extension      Hip abduction      Hip adduction      Hip internal rotation      Hip external rotation      Knee flexion      Knee extension      Ankle dorsiflexion      Ankle plantarflexion      Ankle inversion      Ankle eversion       (Blank rows = not tested)     LE MMT:   MMT Right 02/19/2021 Left 02/19/2021  Hip flexion      Hip extension      Hip abduction      Hip adduction      Hip internal rotation      Hip external rotation      Knee flexion      Knee extension      Ankle dorsiflexion      Ankle plantarflexion      Ankle inversion      Ankle eversion       (Blank rows = not tested) - LE Myotomal assessment was negative   LUMBAR SPECIAL TESTS:  Slump test: Negative   FUNCTIONAL TESTS:  NA   GAIT: Distance walked: Pt tolerated ambulation within clinic Assistive device utilized: None Level of assistance: Complete Independence         TODAY'S TREATMENT  Seated trunk forward flexion c physioball x5, 20 sec Supine PPT x10, 3 sec     PATIENT EDUCATION:  Education details: Eval findings, POC, HEP, sleeping positions and support for comfort, to walk 20-30 mins 4-5x/week, change positions every 15-30 mins. Person educated: Patient Education method: Explanation, Demonstration, Tactile cues, Verbal cues, and Handouts Education comprehension: verbalized understanding, returned demonstration, verbal cues required, and tactile cues required     HOME EXERCISE PROGRAM: Access Code: [email or web address] URL: [email or web address] Date: 02/18/2021 Prepared by: Gar Ponto   Exercises Seated Flexion Stretch with Swiss Ball - 3 x daily - 7 x weekly - 5 reps - 20 hold Supine Posterior  Pelvic Tilt - 2 x daily - 7 x weekly - 3 sets - 10 reps - 3 hold     ASSESSMENT:   CLINICAL IMPRESSION: Patient is a 51 y.o. F who was seen today for physical therapy evaluation and treatment for Lumbar back pain with radiculopathy affecting lower extremity. Objective impairments include decreased activity tolerance, decreased strength, postural dysfunction, obesity, and pain and core weakness. Directional preference assessment revealed a flexion bias. Pt reported improved pain at the end of the session. These impairments are limiting  patient from cleaning, community activity, occupation, Medical sales representative, yard work, shopping, and sleep . Personal factors including OA, fibromyalgia, RA, HA, anxiety, obesity are also affecting patient's functional outcome. Patient will benefit from skilled PT to address above impairments and improve overall function.   REHAB POTENTIAL: Good   CLINICAL DECISION MAKING: Stable/uncomplicated   EVALUATION COMPLEXITY: Low     GOALS:   SHORT TERM GOALS:   STG Name Target Date Goal status  1 Pt will be Ind in an initial HEP Baseline: started on eval 03/12/21 INITIAL  2 Pt will voice understanding of measure to decrease and manage low back pain Baseline:  03/12/21 INITIAL    LONG TERM GOALS:    LTG Name Target Date Goal status  1 Pt will report a decrease in low back and L LE pain to 3/10 or less with daily activities Baseline:7/10 04/08/21 INITIAL  2 Pt will demonstrate full trunk ROM with low back and L LE pain reproduction of 2/10 or less Baseline: Min decrease R lat flexion. Significant pain provoked c R lat flexion and with ext 04/08/21 INITIAL  3 Pt will be able to walk and stand for 30 mins with 2/10 or less low back and L LE pain reproduction  Baseline: report of 15 mins 04/08/21 INITIAL  4 Pt will be Ind in a final HEP to maintain achieved level of function Baseline: 04/08/21 INITIAL    PLAN: PT FREQUENCY: 2x/week   PT DURATION: 6 weeks   PLANNED  INTERVENTIONS: Therapeutic exercises, Therapeutic activity, Neuro Muscular re-education, Gait training, Patient/Family education, Joint mobilization, Dry Needling, Electrical stimulation, Spinal mobilization, Moist heat, Taping, Traction, Ultrasound, Ionotophoresis 4mg /ml Dexamethasone, and Manual therapy   PLAN FOR NEXT SESSION: Assess pt's response to HEP, progress therex as indicated, use of modalities and manual therapy as indicate     Alezander Dimaano 02/24/2021, 6:10 AM

## 2021-02-26 ENCOUNTER — Ambulatory Visit: Payer: Medicare Other

## 2021-02-26 ENCOUNTER — Other Ambulatory Visit: Payer: Self-pay

## 2021-02-26 DIAGNOSIS — M5416 Radiculopathy, lumbar region: Secondary | ICD-10-CM

## 2021-02-26 DIAGNOSIS — R293 Abnormal posture: Secondary | ICD-10-CM

## 2021-02-26 NOTE — Therapy (Signed)
OUTPATIENT PHYSICAL THERAPY TREATMENT NOTE   Patient Name: Brittney [name] MRN: [id number] DOB:1970-05-27, 51 y.o., female Today's Date: 02/28/2021  PCP: Sandrea Hughs, NP REFERRING PROVIDER: Lauree Chandler, NP   PT End of Session - 02/28/21 0035     Visit Number 2    Number of Visits 13    Date for PT Re-Evaluation 04/08/21    Authorization Type [employer]ICARE; MEDICAID [place] ACCESS    Progress Note Due on Visit 10    PT Start Time 1504    PT Stop Time 1545    PT Time Calculation (min) 41 min    Activity Tolerance Patient tolerated treatment well    Behavior During Therapy WFL for tasks assessed/performed             Past Medical History:  Diagnosis Date   Abscess    to eyelid   Anxiety    Arthritis    KNEES ELBOWS HIPS SHOULDER FINGERS   Asthma    Back pain    Bipolar disorder (HCC)    Depression    BIPOLAR   Dyspnea    Fibromyalgia    GERD (gastroesophageal reflux disease)    Headache syndrome 12/12/2019   Headache(784.0)    MIGRAINES   History of colonoscopy    History of degenerative disc disease    Hyperlipidemia    Major depressive disorder    Memory difficulties 06/21/2014   Neck pain    OA (osteoarthritis) of knee    Obesity    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   Rheumatoid arthritis ([place])    Seizures ([place])    FACIAL SEIZURES LAST 4 DAYS AGO   Past Surgical History:  Procedure Laterality Date   CARDIAC CATHETERIZATION  2010   DILATION AND CURETTAGE OF UTERUS  1994   LAPAROSCOPY N/A 11/21/2012   Procedure: LAPAROSCOPY OPERATIVE REMOVAL OF RIGHT TUBE AND OVARY AND FLUID FROM MASS;  Surgeon: Melina Schools, MD;  Location: [facility];  Service: Gynecology;  Laterality: N/A;  1 1/2hrs OR time   OOPHORECTOMY     Patient Active Problem List   Diagnosis Date Noted   Back pain with radiculopathy 01/07/2021   Hyperlipidemia    OA (osteoarthritis) of knee    GERD (gastroesophageal reflux disease)    Rheumatoid arthritis ([place])     Headache syndrome 12/12/2019   Bipolar 1 disorder, depressed, moderate ([place]) 12/14/2018   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   Fibromyalgia 02/03/2015   Tendinitis of both rotator cuffs 02/03/2015   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   Insomnia secondary to chronic pain 11/13/2014   Fatigue due to sleep pattern disturbance 11/13/2014   Memory difficulties 06/21/2014   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   PTSD (post-traumatic stress disorder) 12/13/2013   Bipolar depression ([place]) 12/12/2013   PERICARDIAL EFFUSION 12/23/2008   PSEUDOTUMOR CEREBRI 12/05/2008   UNSPECIFIED PLEURAL EFFUSION 12/05/2008   DYSPNEA 12/05/2008   OBESITY 11/19/2008   Migraine 11/19/2008   ASTHMA 11/19/2008   OTHER SPECIFIED DISORDER OF STOMACH AND DUODENUM 03/11/2008    REFERRING DIAG: Lumbar back pain with radiculopathy affecting lower extremity   THERAPY DIAG:  Lumbar back pain with radiculopathy affecting lower extremity  Abnormal posture  PERTINENT HISTORY: OA, fibromyalgia, RA, HA, anxiety, obesity    PRECAUTIONS: none  SUBJECTIVE: Pt reports she is competing her exs. She notes she is having more good days than bad. She is still having low back pain which is waking her  up around 2pm.  PAIN:  Are you having pain? No currently. Yes when asleep NPRS scale: 0/10 Pain location: low back Pain orientation: Posterior and Lower  PAIN TYPE: aching, burning, sharp, and throbbing Pain description: intermittent  Aggravating factors: Too much activity, sitting, standing Relieving factors: Lying down, ice, heat      OBJECTIVE:    DIAGNOSTIC FINDINGS:    FINDINGS: There are 5 non-rib-bearing lumbar vertebra. No acute fracture. Vertebral body heights are normal. Posterior elements are intact. Trace anterolisthesis of L3 and L4, trace retrolisthesis of L4 on L5 and L5 on S1. minor endplate spurring at multiple levels with slight disc space  narrowing at L4-L5. There is facet hypertrophy at L2-L3 and L3-L4. No evidence of focal bone lesion or bony destruction. Sacroiliac joints are congruent.   IMPRESSION: 1. No acute fracture of the lumbar spine. 2. Mild multilevel spondylosis and facet hypertrophy.   SCREENING FOR RED FLAGS: Bowel or bladder incontinence: No   Cauda equina syndrome: No   COGNITION:          Overall cognitive status: Within functional limits for tasks assessed                        SENSATION:          Light touch: Appears intact   MUSCLE LENGTH: Hamstrings: Right TBA deg; Left TBA deg Marcello Moores test: Right TBA deg; Left TBA deg   POSTURE:  Increased lumbar lordosis which is not reversed with forward flexion, ant. pelvis rotation   PALPATION: TTP of the L paraspinals   LUMBARAROM/PROM   AROM AROM  02/19/2021  Flexion Full  Extension Full, provoked pain  Right lateral flexion Min decresae, provoked pain  Left lateral flexion Full  Right rotation Full  Left rotation Full   (Blank rows = not tested)   LE AROM/PROM:   A/PROM Right 02/19/2021 Left 02/19/2021  Hip flexion      Hip extension      Hip abduction      Hip adduction      Hip internal rotation      Hip external rotation      Knee flexion      Knee extension      Ankle dorsiflexion      Ankle plantarflexion      Ankle inversion      Ankle eversion       (Blank rows = not tested)     LE MMT:   MMT Right 02/19/2021 Left 02/19/2021  Hip flexion      Hip extension      Hip abduction      Hip adduction      Hip internal rotation      Hip external rotation      Knee flexion      Knee extension      Ankle dorsiflexion      Ankle plantarflexion      Ankle inversion      Ankle eversion       (Blank rows = not tested) - LE Myotomal assessment was negative   LUMBAR SPECIAL TESTS:  Slump test: Negative   FUNCTIONAL TESTS:  NA   GAIT: Distance walked: Pt tolerated ambulation within clinic Assistive device utilized:  None Level of assistance: Complete Independence  [facility] Adult PT Treatment:  DATE: 02/25/21 Therapeutic Exercise: PPT, 10x, 3" Marching c PPT, x10 each LE Piriformis stretch,x2, 15" Bridging 2x10, 5" HL clams, 2x10, GTB Hip add ball queeze Seated trunk forward flexion c physioball x5, 20 sec  Self Care: Updated HEP  Hamstring flexibility is WNLs bilat. Hip flexors are      TODAY'S TREATMENT : Eval Seated trunk forward flexion c physioball x5, 20 sec Supine PPT x10, 3 sec     PATIENT EDUCATION:  Education details: Eval findings, POC, HEP, sleeping positions and support for comfort, to walk 20-30 mins 4-5x/week, change positions every 15-30 mins. Person educated: Patient Education method: Explanation, Demonstration, Tactile cues, Verbal cues, and Handouts Education comprehension: verbalized understanding, returned demonstration, verbal cues required, and tactile cues required     HOME EXERCISE PROGRAM: Access Code: [email or web address] URL: [email or web address] Date: 02/18/2021 Prepared by: Gar Ponto   Exercises Seated Flexion Stretch with Swiss Ball - 3 x daily - 7 x weekly - 5 reps - 20 hold Supine Posterior Pelvic Tilt - 2 x daily - 7 x weekly - 3 sets - 10 reps - 3 hold     ASSESSMENT:   CLINICAL IMPRESSION: PT was completed for therex for lumbopelvic flexibility and strengthening. Therex were added to the pt's HEP. Pt returned proper demonstration of the HEP exs. Pt tolerated today's PT session without adverse effects.  REHAB POTENTIAL: Good   CLINICAL DECISION MAKING: Stable/uncomplicated   EVALUATION COMPLEXITY: Low     GOALS:   SHORT TERM GOALS:   STG Name Target Date Goal status  1 Pt will be Ind in an initial HEP Baseline: started on eval 03/12/21 INITIAL  2 Pt will voice understanding of measure to decrease and manage low back pain Baseline:  03/12/21 INITIAL    LONG TERM GOALS:    LTG Name  Target Date Goal status  1 Pt will report a decrease in low back and L LE pain to 3/10 or less with daily activities Baseline:7/10 04/08/21 INITIAL  2 Pt will demonstrate full trunk ROM with low back and L LE pain reproduction of 2/10 or less Baseline: Min decrease R lat flexion. Significant pain provoked c R lat flexion and with ext 04/08/21 INITIAL  3 Pt will be able to walk and stand for 30 mins with 2/10 or less low back and L LE pain reproduction  Baseline: report of 15 mins 04/08/21 INITIAL  4 Pt will be Ind in a final HEP to maintain achieved level of function Baseline: 04/08/21 INITIAL    PLAN: PT FREQUENCY: 2x/week   PT DURATION: 6 weeks   PLANNED INTERVENTIONS: Therapeutic exercises, Therapeutic activity, Neuro Muscular re-education, Gait training, Patient/Family education, Joint mobilization, Dry Needling, Electrical stimulation, Spinal mobilization, Moist heat, Taping, Traction, Ultrasound, Ionotophoresis 4mg /ml Dexamethasone, and Manual therapy   PLAN FOR NEXT SESSION: Assess pt's response to HEP, progress therex as indicated, use of modalities and manual therapy as indicated     [employer] MS, PT 02/28/21 12:52 AM

## 2021-03-03 ENCOUNTER — Ambulatory Visit: Payer: Medicare Other

## 2021-03-03 NOTE — Therapy (Incomplete)
OUTPATIENT PHYSICAL THERAPY TREATMENT NOTE   Patient Name: Brittney [name] MRN: [id number] DOB:1970-03-31, 51 y.o., female Today's Date: 03/03/2021  PCP: Sandrea Hughs, NP REFERRING PROVIDER: Lauree Chandler, NP     Past Medical History:  Diagnosis Date   Abscess    to eyelid   Anxiety    Arthritis    KNEES ELBOWS HIPS SHOULDER FINGERS   Asthma    Back pain    Bipolar disorder ([place])    Depression    BIPOLAR   Dyspnea    Fibromyalgia    GERD (gastroesophageal reflux disease)    Headache syndrome 12/12/2019   Headache(784.0)    MIGRAINES   History of colonoscopy    History of degenerative disc disease    Hyperlipidemia    Major depressive disorder    Memory difficulties 06/21/2014   Neck pain    OA (osteoarthritis) of knee    Obesity    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   Rheumatoid arthritis ([place])    Seizures ([place])    FACIAL SEIZURES LAST 4 DAYS AGO   Past Surgical History:  Procedure Laterality Date   CARDIAC CATHETERIZATION  2010   DILATION AND CURETTAGE OF UTERUS  1994   LAPAROSCOPY N/A 11/21/2012   Procedure: LAPAROSCOPY OPERATIVE REMOVAL OF RIGHT TUBE AND OVARY AND FLUID FROM MASS;  Surgeon: Melina Schools, MD;  Location: [facility];  Service: Gynecology;  Laterality: N/A;  1 1/2hrs OR time   OOPHORECTOMY     Patient Active Problem List   Diagnosis Date Noted   Back pain with radiculopathy 01/07/2021   Hyperlipidemia    OA (osteoarthritis) of knee    GERD (gastroesophageal reflux disease)    Rheumatoid arthritis ([place])    Headache syndrome 12/12/2019   Bipolar 1 disorder, depressed, moderate ([place]) 12/14/2018   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   Fibromyalgia 02/03/2015   Tendinitis of both rotator cuffs 02/03/2015   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   Insomnia secondary to chronic pain 11/13/2014   Fatigue due to sleep pattern disturbance 11/13/2014   Memory difficulties 06/21/2014    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   PTSD (post-traumatic stress disorder) 12/13/2013   Bipolar depression ([place]) 12/12/2013   PERICARDIAL EFFUSION 12/23/2008   PSEUDOTUMOR CEREBRI 12/05/2008   UNSPECIFIED PLEURAL EFFUSION 12/05/2008   DYSPNEA 12/05/2008   OBESITY 11/19/2008   Migraine 11/19/2008   ASTHMA 11/19/2008   OTHER SPECIFIED DISORDER OF STOMACH AND DUODENUM 03/11/2008    REFERRING DIAG: Lumbar back pain with radiculopathy affecting lower extremity   THERAPY DIAG:  No diagnosis found.  PERTINENT HISTORY: OA, fibromyalgia, RA, HA, anxiety, obesity    PRECAUTIONS: none  SUBJECTIVE: Pt reports she is competing her exs. She notes she is having more good days than bad. She is still having low back pain which is waking her up around 2pm.  PAIN:  Are you having pain? No currently. Yes when asleep NPRS scale: 0/10 Pain location: low back Pain orientation: Posterior and Lower  PAIN TYPE: aching, burning, sharp, and throbbing Pain description: intermittent  Aggravating factors: Too much activity, sitting, standing Relieving factors: Lying down, ice, heat      OBJECTIVE:    DIAGNOSTIC FINDINGS:    FINDINGS: There are 5 non-rib-bearing lumbar vertebra. No acute fracture. Vertebral body heights are normal. Posterior elements are intact. Trace anterolisthesis of L3 and L4, trace retrolisthesis of L4 on L5 and L5 on S1. minor endplate spurring at multiple levels with slight  disc space narrowing at L4-L5. There is facet hypertrophy at L2-L3 and L3-L4. No evidence of focal bone lesion or bony destruction. Sacroiliac joints are congruent.   IMPRESSION: 1. No acute fracture of the lumbar spine. 2. Mild multilevel spondylosis and facet hypertrophy.   SCREENING FOR RED FLAGS: Bowel or bladder incontinence: No   Cauda equina syndrome: No   COGNITION:          Overall cognitive status: Within functional limits for tasks assessed                         SENSATION:          Light touch: Appears intact   MUSCLE LENGTH: Hamstrings: Right TBA deg; Left TBA deg Marcello Moores test: Right TBA deg; Left TBA deg   POSTURE:  Increased lumbar lordosis which is not reversed with forward flexion, ant. pelvis rotation   PALPATION: TTP of the L paraspinals   LUMBARAROM/PROM   AROM AROM  02/19/2021  Flexion Full  Extension Full, provoked pain  Right lateral flexion Min decresae, provoked pain  Left lateral flexion Full  Right rotation Full  Left rotation Full   (Blank rows = not tested)   LE AROM/PROM:   A/PROM Right 02/19/2021 Left 02/19/2021  Hip flexion      Hip extension      Hip abduction      Hip adduction      Hip internal rotation      Hip external rotation      Knee flexion      Knee extension      Ankle dorsiflexion      Ankle plantarflexion      Ankle inversion      Ankle eversion       (Blank rows = not tested)     LE MMT:   MMT Right 02/19/2021 Left 02/19/2021  Hip flexion      Hip extension      Hip abduction      Hip adduction      Hip internal rotation      Hip external rotation      Knee flexion      Knee extension      Ankle dorsiflexion      Ankle plantarflexion      Ankle inversion      Ankle eversion       (Blank rows = not tested) - LE Myotomal assessment was negative   LUMBAR SPECIAL TESTS:  Slump test: Negative   FUNCTIONAL TESTS:  NA   GAIT: Distance walked: Pt tolerated ambulation within clinic Assistive device utilized: None Level of assistance: Complete Independence  [facility] Adult PT Treatment:                                                DATE: 03/03/21  Therapeutic Exercise: *** Manual Therapy: *** Neuromuscular re-ed: *** Therapeutic Activity: *** Modalities: *** Self Care: ***   [facility] Adult PT Treatment:                                                DATE: 02/25/21 Therapeutic Exercise: PPT, 10x, 3" Marching c PPT, x10 each LE Piriformis stretch,x2, 15"  Bridging 2x10,  5" HL clams, 2x10, GTB Hip add ball queeze Seated trunk forward flexion c physioball x5, 20 sec  Self Care: Updated HEP  Hamstring flexibility is WNLs bilat. Hip flexors are      TODAY'S TREATMENT : Eval Seated trunk forward flexion c physioball x5, 20 sec Supine PPT x10, 3 sec     PATIENT EDUCATION:  Education details: Eval findings, POC, HEP, sleeping positions and support for comfort, to walk 20-30 mins 4-5x/week, change positions every 15-30 mins. Person educated: Patient Education method: Explanation, Demonstration, Tactile cues, Verbal cues, and Handouts Education comprehension: verbalized understanding, returned demonstration, verbal cues required, and tactile cues required     HOME EXERCISE PROGRAM: Access Code: [email or web address] URL: [email or web address] Date: 02/18/2021 Prepared by: Gar Ponto   Exercises Seated Flexion Stretch with Swiss Ball - 3 x daily - 7 x weekly - 5 reps - 20 hold Supine Posterior Pelvic Tilt - 2 x daily - 7 x weekly - 3 sets - 10 reps - 3 hold     ASSESSMENT:   CLINICAL IMPRESSION: PT was completed for therex for lumbopelvic flexibility and strengthening. Therex were added to the pt's HEP. Pt returned proper demonstration of the HEP exs. Pt tolerated today's PT session without adverse effects.  REHAB POTENTIAL: Good   CLINICAL DECISION MAKING: Stable/uncomplicated   EVALUATION COMPLEXITY: Low     GOALS:   SHORT TERM GOALS:   STG Name Target Date Goal status  1 Pt will be Ind in an initial HEP Baseline: started on eval 03/12/21 INITIAL  2 Pt will voice understanding of measure to decrease and manage low back pain Baseline:  03/12/21 INITIAL    LONG TERM GOALS:    LTG Name Target Date Goal status  1 Pt will report a decrease in low back and L LE pain to 3/10 or less with daily activities Baseline:7/10 04/08/21 INITIAL  2 Pt will demonstrate full trunk ROM with low back and L LE pain reproduction of 2/10 or  less Baseline: Min decrease R lat flexion. Significant pain provoked c R lat flexion and with ext 04/08/21 INITIAL  3 Pt will be able to walk and stand for 30 mins with 2/10 or less low back and L LE pain reproduction  Baseline: report of 15 mins 04/08/21 INITIAL  4 Pt will be Ind in a final HEP to maintain achieved level of function Baseline: 04/08/21 INITIAL    PLAN: PT FREQUENCY: 2x/week   PT DURATION: 6 weeks   PLANNED INTERVENTIONS: Therapeutic exercises, Therapeutic activity, Neuro Muscular re-education, Gait training, Patient/Family education, Joint mobilization, Dry Needling, Electrical stimulation, Spinal mobilization, Moist heat, Taping, Traction, Ultrasound, Ionotophoresis 4mg /ml Dexamethasone, and Manual therapy   PLAN FOR NEXT SESSION: Assess pt's response to HEP, progress therex as indicated, use of modalities and manual therapy as indicated     [employer] MS, PT 03/03/21 7:56 AM

## 2021-03-05 ENCOUNTER — Ambulatory Visit: Payer: Medicare Other

## 2021-03-05 ENCOUNTER — Other Ambulatory Visit: Payer: Self-pay

## 2021-03-05 DIAGNOSIS — M5416 Radiculopathy, lumbar region: Secondary | ICD-10-CM

## 2021-03-05 DIAGNOSIS — R293 Abnormal posture: Secondary | ICD-10-CM

## 2021-03-05 NOTE — Therapy (Signed)
OUTPATIENT PHYSICAL THERAPY TREATMENT NOTE   Patient Name: Brittney [name] MRN: [id number] DOB:Dec 15, 1970, 50 y.o., female Today's Date: 03/05/2021  PCP: Sandrea Hughs, NP REFERRING PROVIDER: Eunice Blase, MD   PT End of Session - 03/05/21 1452     Visit Number 3    Number of Visits 13    Date for PT Re-Evaluation 04/08/21    Authorization Type HE[employer]Y BLUE MEDICARE; MEDICAID  ACCESS    Progress Note Due on Visit 10    PT Start Time 1500    PT Stop Time 1542    PT Time Calculation (min) 42 min    Activity Tolerance Patient tolerated treatment well    Behavior During Therapy WFL for tasks assessed/performed              Past Medical History:  Diagnosis Date   Abscess    to eyelid   Anxiety    Arthritis    KNEES ELBOWS HIPS SHOULDER FINGERS   Asthma    Back pain    Bipolar disorder (HCC)    Depression    BIPOLAR   Dyspnea    Fibromyalgia    GERD (gastroesophageal reflux disease)    Headache syndrome 12/12/2019   Headache(784.0)    MIGRAINES   History of colonoscopy    History of degenerative disc disease    Hyperlipidemia    Major depressive disorder    Memory difficulties 06/21/2014   Neck pain    OA (osteoarthritis) of knee    Obesity    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   Rheumatoid arthritis ([place])    Seizures ([place])    FACIAL SEIZURES LAST 4 DAYS AGO   Past Surgical History:  Procedure Laterality Date   CARDIAC CATHETERIZATION  2010   DILATION AND CURETTAGE OF UTERUS  1994   LAPAROSCOPY N/A 11/21/2012   Procedure: LAPAROSCOPY OPERATIVE REMOVAL OF RIGHT TUBE AND OVARY AND FLUID FROM MASS;  Surgeon: Melina Schools, MD;  Location: [facility];  Service: Gynecology;  Laterality: N/A;  1 1/2hrs OR time   OOPHORECTOMY     Patient Active Problem List   Diagnosis Date Noted   Back pain with radiculopathy 01/07/2021   Hyperlipidemia    OA (osteoarthritis) of knee    GERD (gastroesophageal reflux disease)    Rheumatoid arthritis ([place])     Headache syndrome 12/12/2019   Bipolar 1 disorder, depressed, moderate ([place]) 12/14/2018   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   Fibromyalgia 02/03/2015   Tendinitis of both rotator cuffs 02/03/2015   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   Insomnia secondary to chronic pain 11/13/2014   Fatigue due to sleep pattern disturbance 11/13/2014   Memory difficulties 06/21/2014   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   PTSD (post-traumatic stress disorder) 12/13/2013   Bipolar depression ([place]) 12/12/2013   PERICARDIAL EFFUSION 12/23/2008   PSEUDOTUMOR CEREBRI 12/05/2008   UNSPECIFIED PLEURAL EFFUSION 12/05/2008   DYSPNEA 12/05/2008   OBESITY 11/19/2008   Migraine 11/19/2008   ASTHMA 11/19/2008   OTHER SPECIFIED DISORDER OF STOMACH AND DUODENUM 03/11/2008    REFERRING DIAG: Lumbar back pain with radiculopathy affecting lower extremity   THERAPY DIAG:  Lumbar back pain with radiculopathy affecting lower extremity  Abnormal posture  PERTINENT HISTORY: OA, fibromyalgia, RA, HA, anxiety, obesity    PRECAUTIONS: none  SUBJECTIVE: Low back pain is better. Pt notes some hip soreness with therex which resolves.  PAIN:  Are you having pain? Not currently. 7/10 yesterday NPRS scale: 0/10  Pain location: low back Pain orientation: Posterior and Lower  PAIN TYPE: aching, burning, sharp, and throbbing Pain description: intermittent  Aggravating factors: Too much activity, sitting, standing Relieving factors: Lying down, ice, heat  OBJECTIVE:    DIAGNOSTIC FINDINGS:    FINDINGS: There are 5 non-rib-bearing lumbar vertebra. No acute fracture. Vertebral body heights are normal. Posterior elements are intact. Trace anterolisthesis of L3 and L4, trace retrolisthesis of L4 on L5 and L5 on S1. minor endplate spurring at multiple levels with slight disc space narrowing at L4-L5. There is facet hypertrophy at L2-L3 and L3-L4. No evidence of  focal bone lesion or bony destruction. Sacroiliac joints are congruent.   IMPRESSION: 1. No acute fracture of the lumbar spine. 2. Mild multilevel spondylosis and facet hypertrophy.   SCREENING FOR RED FLAGS: Bowel or bladder incontinence: No   Cauda equina syndrome: No   COGNITION:          Overall cognitive status: Within functional limits for tasks assessed                        SENSATION:          Light touch: Appears intact   MUSCLE LENGTH: Hamstrings: Right TBA deg; Left TBA deg Marcello Moores test: Right TBA deg; Left TBA deg   POSTURE:  Increased lumbar lordosis which is not reversed with forward flexion, ant. pelvis rotation   PALPATION: TTP of the L paraspinals   LUMBARAROM/PROM   AROM AROM  02/19/2021  Flexion Full  Extension Full, provoked pain  Right lateral flexion Min decresae, provoked pain  Left lateral flexion Full  Right rotation Full  Left rotation Full   (Blank rows = not tested)   LE AROM/PROM:   A/PROM Right 02/19/2021 Left 02/19/2021  Hip flexion      Hip extension      Hip abduction      Hip adduction      Hip internal rotation      Hip external rotation      Knee flexion      Knee extension      Ankle dorsiflexion      Ankle plantarflexion      Ankle inversion      Ankle eversion       (Blank rows = not tested)     LE MMT:   MMT Right 02/19/2021 Left 02/19/2021  Hip flexion      Hip extension      Hip abduction      Hip adduction      Hip internal rotation      Hip external rotation      Knee flexion      Knee extension      Ankle dorsiflexion      Ankle plantarflexion      Ankle inversion      Ankle eversion       (Blank rows = not tested) - LE Myotomal assessment was negative   LUMBAR SPECIAL TESTS:  Slump test: Negative   FUNCTIONAL TESTS:  NA   GAIT: Distance walked: Pt tolerated ambulation within clinic Assistive device utilized: None Level of assistance: Complete Independence  [facility] Adult PT Treatment:                                                 DATE:  03/05/21  Therapeutic Exercise: NuStep L4, 3 mins, UE/LE. Stopped with pt reporting 3/10 l knee pain Seated trunk forward flexion c physioball x5, 20 sec  PPT, 10x, 3" Marching c PPT, x10 each LE Piriformis stretch,x2, 15" Bridging 2x10, 5" HL clams, 2x10, GTB Hip add ball squeeze,x15, 5" STS 2x5, with use of hands  Self Care: Discussed hurt vs. Harm with pt experiencing occasional pain with hips, knees, and low back with therex   [facility] Adult PT Treatment:                                                DATE: 02/25/21 Therapeutic Exercise: PPT, 10x, 3" Marching c PPT, x10 each LE Piriformis stretch,x2, 15" Bridging 2x10, 5" HL clams, 2x10, GTB Hip add ball squeeze x15 Seated trunk forward flexion c physioball x5, 20 sec   Self Care: Updated HEP  Hamstring flexibility is WNLs bilat. Hip flexors are WNLs bilat      TODAY'S TREATMENT : Eval 02/18/21 Seated trunk forward flexion c physioball x5, 20 sec Supine PPT x10, 3 sec     PATIENT EDUCATION:  Education details: Eval findings, POC, HEP, sleeping positions and support for comfort, to walk 20-30 mins 4-5x/week, change positions every 15-30 mins. Person educated: Patient Education method: Explanation, Demonstration, Tactile cues, Verbal cues, and Handouts Education comprehension: verbalized understanding, returned demonstration, verbal cues required, and tactile cues required     HOME EXERCISE PROGRAM: Access Code: [email or web address] URL: [email or web address] Date: 02/18/2021 Prepared by: Gar Ponto   Exercises Seated Flexion Stretch with Swiss Ball - 3 x daily - 7 x weekly - 5 reps - 20 hold Supine Posterior Pelvic Tilt - 2 x daily - 7 x weekly - 3 sets - 10 reps - 3 hold     ASSESSMENT:   CLINICAL IMPRESSION: Pt is responding appropriately to PT with decrease in low back pain. PT was completed for lumbopelvic/hip/core flexibility and strengthening. Pt tolerated PT  today without adverse effects. Pt will continue to benefit from skilled PT to address deficits to improve function without adverse effects.  REHAB POTENTIAL: Good   CLINICAL DECISION MAKING: Stable/uncomplicated   EVALUATION COMPLEXITY: Low     GOALS:   SHORT TERM GOALS:   STG Name Target Date Goal status  1 Pt will be Ind in an initial HEP Baseline: started on eval 03/12/21 INITIAL  2 Pt will voice understanding of measure to decrease and manage low back pain Baseline:  03/12/21 INITIAL    LONG TERM GOALS:    LTG Name Target Date Goal status  1 Pt will report a decrease in low back and L LE pain to 3/10 or less with daily activities Baseline:7/10 04/08/21 INITIAL  2 Pt will demonstrate full trunk ROM with low back and L LE pain reproduction of 2/10 or less Baseline: Min decrease R lat flexion. Significant pain provoked c R lat flexion and with ext 04/08/21 INITIAL  3 Pt will be able to walk and stand for 30 mins with 2/10 or less low back and L LE pain reproduction  Baseline: report of 15 mins 04/08/21 INITIAL  4 Pt will be Ind in a final HEP to maintain achieved level of function Baseline: 04/08/21 INITIAL    PLAN: PT FREQUENCY: 2x/week   PT DURATION: 6 weeks   PLANNED INTERVENTIONS: Therapeutic exercises, Therapeutic activity, Neuro Muscular re-education, Gait  training, Patient/Family education, Joint mobilization, Dry Needling, Electrical stimulation, Spinal mobilization, Moist heat, Taping, Traction, Ultrasound, Ionotophoresis 4mg /ml Dexamethasone, and Manual therapy   PLAN FOR NEXT SESSION: Assess pt's response to HEP, progress therex as indicated, use of modalities and manual therapy as indicated, assess STGs     Santoria Chason MS, PT 03/05/21 6:32 PM

## 2021-03-09 ENCOUNTER — Other Ambulatory Visit: Payer: Medicare Other

## 2021-03-10 ENCOUNTER — Other Ambulatory Visit: Payer: Medicare Other

## 2021-03-10 ENCOUNTER — Other Ambulatory Visit: Payer: Self-pay

## 2021-03-10 ENCOUNTER — Ambulatory Visit: Payer: Medicare Other

## 2021-03-10 DIAGNOSIS — M5416 Radiculopathy, lumbar region: Secondary | ICD-10-CM

## 2021-03-10 DIAGNOSIS — R293 Abnormal posture: Secondary | ICD-10-CM

## 2021-03-10 NOTE — Therapy (Signed)
OUTPATIENT PHYSICAL THERAPY TREATMENT NOTE   Patient Name: ANAMARIE HUNN MRN: [id number] DOB:August 24, 1970, 51 y.o., female Today's Date: 03/10/2021  PCP: Sandrea Hughs, NP REFERRING PROVIDER: Sandrea Hughs, NP   PT End of Session - 03/10/21 1513     Visit Number 4    Number of Visits 13    Date for PT Re-Evaluation 04/08/21    Authorization Type HEALTHY BLUE MEDICARE; MEDICAID [place] ACCESS    Progress Note Due on Visit 10    PT Start Time 1507    PT Stop Time 1550    PT Time Calculation (min) 43 min    Activity Tolerance Patient tolerated treatment well    Behavior During Therapy WFL for tasks assessed/performed               Past Medical History:  Diagnosis Date   Abscess    to eyelid   Anxiety    Arthritis    KNEES ELBOWS HIPS SHOULDER FINGERS   Asthma    Back pain    Bipolar disorder (HCC)    Depression    BIPOLAR   Dyspnea    Fibromyalgia    GERD (gastroesophageal reflux disease)    Headache syndrome 12/12/2019   Headache(784.0)    MIGRAINES   History of colonoscopy    History of degenerative disc disease    Hyperlipidemia    Major depressive disorder    Memory difficulties 06/21/2014   Neck pain    OA (osteoarthritis) of knee    Obesity    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   Rheumatoid arthritis ([place])    Seizures ([place])    FACIAL SEIZURES LAST 4 DAYS AGO   Past Surgical History:  Procedure Laterality Date   CARDIAC CATHETERIZATION  2010   DILATION AND CURETTAGE OF UTERUS  1994   LAPAROSCOPY N/A 11/21/2012   Procedure: LAPAROSCOPY OPERATIVE REMOVAL OF RIGHT TUBE AND OVARY AND FLUID FROM MASS;  Surgeon: Melina Schools, MD;  Location: [facility];  Service: Gynecology;  Laterality: N/A;  1 1/2hrs OR time   OOPHORECTOMY     Patient Active Problem List   Diagnosis Date Noted   Back pain with radiculopathy 01/07/2021   Hyperlipidemia    OA (osteoarthritis) of knee    GERD (gastroesophageal reflux disease)    Rheumatoid arthritis ([place])     Headache syndrome 12/12/2019   Bipolar 1 disorder, depressed, moderate ([place]) 12/14/2018   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   Fibromyalgia 02/03/2015   Tendinitis of both rotator cuffs 02/03/2015   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   Insomnia secondary to chronic pain 11/13/2014   Fatigue due to sleep pattern disturbance 11/13/2014   Memory difficulties 06/21/2014   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   PTSD (post-traumatic stress disorder) 12/13/2013   Bipolar depression ([place]) 12/12/2013   PERICARDIAL EFFUSION 12/23/2008   PSEUDOTUMOR CEREBRI 12/05/2008   UNSPECIFIED PLEURAL EFFUSION 12/05/2008   DYSPNEA 12/05/2008   OBESITY 11/19/2008   Migraine 11/19/2008   ASTHMA 11/19/2008   OTHER SPECIFIED DISORDER OF STOMACH AND DUODENUM 03/11/2008    REFERRING DIAG: Lumbar back pain with radiculopathy affecting lower extremity   THERAPY DIAG:  Lumbar back pain with radiculopathy affecting lower extremity  Abnormal posture  PERTINENT HISTORY: OA, fibromyalgia, RA, HA, anxiety, obesity    PRECAUTIONS: none  SUBJECTIVE: Pt reports overall improved LBP. Notes pain yesterday after sitting in an uncomfortable chair. PAIN:  Are you having pain? Not currently. 6/10 yesterday NPRS scale:  0/10 Pain location: low back Pain orientation: Posterior and Lower  PAIN TYPE: aching, burning, sharp, and throbbing Pain description: intermittent  Aggravating factors: Too much activity, sitting, standing Relieving factors: Lying down, ice, heat  OBJECTIVE:    DIAGNOSTIC FINDINGS:    FINDINGS: There are 5 non-rib-bearing lumbar vertebra. No acute fracture. Vertebral body heights are normal. Posterior elements are intact. Trace anterolisthesis of L3 and L4, trace retrolisthesis of L4 on L5 and L5 on S1. minor endplate spurring at multiple levels with slight disc space narrowing at L4-L5. There is facet hypertrophy at L2-L3 and L3-L4. No  evidence of focal bone lesion or bony destruction. Sacroiliac joints are congruent.   IMPRESSION: 1. No acute fracture of the lumbar spine. 2. Mild multilevel spondylosis and facet hypertrophy.   SCREENING FOR RED FLAGS: Bowel or bladder incontinence: No   Cauda equina syndrome: No   COGNITION:          Overall cognitive status: Within functional limits for tasks assessed                        SENSATION:          Light touch: Appears intact   MUSCLE LENGTH: Hamstrings: Right TBA deg; Left TBA deg Marcello Moores test: Right TBA deg; Left TBA deg   POSTURE:  Increased lumbar lordosis which is not reversed with forward flexion, ant. pelvis rotation   PALPATION: TTP of the L paraspinals   LUMBARAROM/PROM   AROM AROM  02/19/2021  Flexion Full  Extension Full, provoked pain  Right lateral flexion Min decresae, provoked pain  Left lateral flexion Full  Right rotation Full  Left rotation Full   (Blank rows = not tested)   LE AROM/PROM:   A/PROM Right 02/19/2021 Left 02/19/2021  Hip flexion      Hip extension      Hip abduction      Hip adduction      Hip internal rotation      Hip external rotation      Knee flexion      Knee extension      Ankle dorsiflexion      Ankle plantarflexion      Ankle inversion      Ankle eversion       (Blank rows = not tested)     LE MMT:   MMT Right 02/19/2021 Left 02/19/2021  Hip flexion      Hip extension      Hip abduction      Hip adduction      Hip internal rotation      Hip external rotation      Knee flexion      Knee extension      Ankle dorsiflexion      Ankle plantarflexion      Ankle inversion      Ankle eversion       (Blank rows = not tested) - LE Myotomal assessment was negative   LUMBAR SPECIAL TESTS:  Slump test: Negative   FUNCTIONAL TESTS:  NA   GAIT: Distance walked: Pt tolerated ambulation within clinic Assistive device utilized: None Level of assistance: Complete Independence   [facility] Adult PT  Treatment:  DATE: 03/10/21 Therapeutic Exercise: NuStep L4, 5 mins, UE/LE. Pt reporting 6/10 R knee pain afterward Seated trunk forward flexion c physioball x5, 20 sec Hamstring stretch, x2, 20"  Marching c PPT, 2x10 each LE 90/90, march, x10 each LE Bridging 2x10, 5" HL alternating clams, 2x10, GTB, each LE Hip add ball squeeze, x15, 5" STS 2x5, with use of hands due to R knee pain during ex  Self Care: Seated PPT for lumbar movement to decrease pain with prolonged sitting and also to assist pt in finding a comfortable sitting position Recommended use of a seat cushion or lumbar support for situations where she knows she may encounter uncomfortable sitting situations    [facility] Adult PT Treatment:                                                DATE: 03/05/21  Therapeutic Exercise: NuStep L4, 3 mins, UE/LE. Stopped with pt reporting 3/10 l knee pain Seated trunk forward flexion c physioball x5, 20 sec  PPT, 10x, 3" Marching c PPT, x10 each LE Piriformis stretch,x2, 15" Bridging 2x10, 5" HL clams, 2x10, GTB Hip add ball squeeze,x15, 5" STS 2x5, with use of hands  Self Care: Discussed hurt vs. Harm with pt experiencing occasional pain with hips, knees, and low back with therex   [facility] Adult PT Treatment:                                                DATE: 02/25/21 Therapeutic Exercise: PPT, 10x, 3" Marching c PPT, x10 each LE Piriformis stretch,x2, 15" Bridging 2x10, 5" HL clams, 2x10, GTB Hip add ball squeeze x15 Seated trunk forward flexion c physioball x5, 20 sec   Self Care: Updated HEP  Hamstring flexibility is WNLs bilat. Hip flexors are WNLs bilat      TODAY'S TREATMENT : Eval 02/18/21 Seated trunk forward flexion c physioball x5, 20 sec Supine PPT x10, 3 sec     PATIENT EDUCATION:  Education details: Eval findings, POC, HEP, sleeping positions and support for comfort, to walk 20-30 mins 4-5x/week, change positions  every 15-30 mins. Person educated: Patient Education method: Explanation, Demonstration, Tactile cues, Verbal cues, and Handouts Education comprehension: verbalized understanding, returned demonstration, verbal cues required, and tactile cues required     HOME EXERCISE PROGRAM: Access Code: [email or web address] URL: [email or web address] Date: 02/18/2021 Prepared by: Gar Ponto   Exercises Seated Flexion Stretch with Swiss Ball - 3 x daily - 7 x weekly - 5 reps - 20 hold Supine Posterior Pelvic Tilt - 2 x daily - 7 x weekly - 3 sets - 10 reps - 3 hold     ASSESSMENT:   CLINICAL IMPRESSION: PT was completed for back/LE flexibility and strengthening and for abdominal strengthening. 90/90 LE abdominal bracing was attempted, but pt was not able to maintain a posterior pelvic tilt. 90/90 SL marching was substituted. Pt has R knee pain intermittently during the PT session, but without overall increase. Pt reported no R knee pain at the end of the session. Instructed in PPT's in sitting to for lumbar motion c prolonged sitting to to find a comfortable sitting position. Pt tolerated the session without adverse effects.  REHAB POTENTIAL: Good   CLINICAL DECISION MAKING:  Stable/uncomplicated   EVALUATION COMPLEXITY: Low     GOALS:   SHORT TERM GOALS:   STG Name Target Date Goal status  1 Pt will be Ind in an initial HEP Baseline: started on eval 03/12/21 INITIAL  2 Pt will voice understanding of measure to decrease and manage low back pain Baseline:  03/12/21 INITIAL    LONG TERM GOALS:    LTG Name Target Date Goal status  1 Pt will report a decrease in low back and L LE pain to 3/10 or less with daily activities Baseline:7/10 04/08/21 INITIAL  2 Pt will demonstrate full trunk ROM with low back and L LE pain reproduction of 2/10 or less Baseline: Min decrease R lat flexion. Significant pain provoked c R lat flexion and with ext 04/08/21 INITIAL  3 Pt will be able to walk and stand  for 30 mins with 2/10 or less low back and L LE pain reproduction  Baseline: report of 15 mins 04/08/21 INITIAL  4 Pt will be Ind in a final HEP to maintain achieved level of function Baseline: 04/08/21 INITIAL    PLAN: PT FREQUENCY: 2x/week   PT DURATION: 6 weeks   PLANNED INTERVENTIONS: Therapeutic exercises, Therapeutic activity, Neuro Muscular re-education, Gait training, Patient/Family education, Joint mobilization, Dry Needling, Electrical stimulation, Spinal mobilization, Moist heat, Taping, Traction, Ultrasound, Ionotophoresis 4mg /ml Dexamethasone, and Manual therapy   PLAN FOR NEXT SESSION: Assess pt's response to HEP, progress therex as indicated, use of modalities and manual therapy as indicated, assess STGs    Wylma Tatem MS, PT 03/10/21 4:19 PM

## 2021-03-12 ENCOUNTER — Other Ambulatory Visit: Payer: Self-pay

## 2021-03-12 ENCOUNTER — Ambulatory Visit: Payer: Medicare Other

## 2021-03-12 DIAGNOSIS — M5416 Radiculopathy, lumbar region: Secondary | ICD-10-CM | POA: Diagnosis not present

## 2021-03-12 DIAGNOSIS — R293 Abnormal posture: Secondary | ICD-10-CM

## 2021-03-12 NOTE — Therapy (Signed)
OUTPATIENT PHYSICAL THERAPY TREATMENT NOTE   Patient Name: Brittney [name] MRN: [id number] DOB:1970-12-06, 51 y.o., female Today's Date: 03/12/2021  PCP: Sandrea Hughs, NP REFERRING PROVIDER: Eunice Blase, MD   PT End of Session - 03/12/21 1509     Visit Number 5    Number of Visits 13    Date for PT Re-Evaluation 04/08/21    Authorization Type [employer]UE MEDICARE; MEDICAID [place] ACCESS    Progress Note Due on Visit 10    PT Start Time 1508    PT Stop Time 1552    PT Time Calculation (min) 44 min    Activity Tolerance Patient tolerated treatment well    Behavior During Therapy WFL for tasks assessed/performed               Past Medical History:  Diagnosis Date   Abscess    to eyelid   Anxiety    Arthritis    KNEES ELBOWS HIPS SHOULDER FINGERS   Asthma    Back pain    Bipolar disorder (HCC)    Depression    BIPOLAR   Dyspnea    Fibromyalgia    GERD (gastroesophageal reflux disease)    Headache syndrome 12/12/2019   Headache(784.0)    MIGRAINES   History of colonoscopy    History of degenerative disc disease    Hyperlipidemia    Major depressive disorder    Memory difficulties 06/21/2014   Neck pain    OA (osteoarthritis) of knee    Obesity    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   Rheumatoid arthritis ([place])    Seizures ([place])    FACIAL SEIZURES LAST 4 DAYS AGO   Past Surgical History:  Procedure Laterality Date   CARDIAC CATHETERIZATION  2010   DILATION AND CURETTAGE OF UTERUS  1994   LAPAROSCOPY N/A 11/21/2012   Procedure: LAPAROSCOPY OPERATIVE REMOVAL OF RIGHT TUBE AND OVARY AND FLUID FROM MASS;  Surgeon: Melina Schools, MD;  Location: [facility];  Service: Gynecology;  Laterality: N/A;  1 1/2hrs OR time   OOPHORECTOMY     Patient Active Problem List   Diagnosis Date Noted   Back pain with radiculopathy 01/07/2021   Hyperlipidemia    OA (osteoarthritis) of knee    GERD (gastroesophageal reflux disease)    Rheumatoid arthritis ([place])     Headache syndrome 12/12/2019   Bipolar 1 disorder, depressed, moderate ([place]) 12/14/2018   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   Fibromyalgia 02/03/2015   Tendinitis of both rotator cuffs 02/03/2015   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   Insomnia secondary to chronic pain 11/13/2014   Fatigue due to sleep pattern disturbance 11/13/2014   Memory difficulties 06/21/2014   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   PTSD (post-traumatic stress disorder) 12/13/2013   Bipolar depression ([place]) 12/12/2013   PERICARDIAL EFFUSION 12/23/2008   PSEUDOTUMOR CEREBRI 12/05/2008   UNSPECIFIED PLEURAL EFFUSION 12/05/2008   DYSPNEA 12/05/2008   OBESITY 11/19/2008   Migraine 11/19/2008   ASTHMA 11/19/2008   OTHER SPECIFIED DISORDER OF STOMACH AND DUODENUM 03/11/2008    REFERRING DIAG: Lumbar back pain with radiculopathy affecting lower extremity   THERAPY DIAG:  Lumbar back pain with radiculopathy affecting lower extremity  Abnormal posture  PERTINENT HISTORY: OA, fibromyalgia, RA, HA, anxiety, obesity    PRECAUTIONS: none  SUBJECTIVE: Pt's reports her low back is doing well with no pain today or yesterday. She notes the whole body night pain she has been having is improved  as well.  PAIN:  Are you having pain? Not currently. NPRS scale: 0/10 Pain location: low back Pain orientation: Posterior and Lower  PAIN TYPE: aching, burning, sharp, and throbbing Pain description: intermittent  Aggravating factors: Too much activity, sitting, standing Relieving factors: Lying down, ice, heat  OBJECTIVE:    DIAGNOSTIC FINDINGS:    FINDINGS: There are 5 non-rib-bearing lumbar vertebra. No acute fracture. Vertebral body heights are normal. Posterior elements are intact. Trace anterolisthesis of L3 and L4, trace retrolisthesis of L4 on L5 and L5 on S1. minor endplate spurring at multiple levels with slight disc space narrowing at L4-L5. There is  facet hypertrophy at L2-L3 and L3-L4. No evidence of focal bone lesion or bony destruction. Sacroiliac joints are congruent.   IMPRESSION: 1. No acute fracture of the lumbar spine. 2. Mild multilevel spondylosis and facet hypertrophy.   SCREENING FOR RED FLAGS: Bowel or bladder incontinence: No   Cauda equina syndrome: No   COGNITION:          Overall cognitive status: Within functional limits for tasks assessed                        SENSATION:          Light touch: Appears intact   MUSCLE LENGTH: Hamstrings: Right TBA deg; Left TBA deg Marcello Moores test: Right TBA deg; Left TBA deg   POSTURE:  Increased lumbar lordosis which is not reversed with forward flexion, ant. pelvis rotation   PALPATION: TTP of the L paraspinals   LUMBARAROM/PROM   AROM AROM  02/19/2021  Flexion Full  Extension Full, provoked pain  Right lateral flexion Min decresae, provoked pain  Left lateral flexion Full  Right rotation Full  Left rotation Full   (Blank rows = not tested)   LE AROM/PROM:   A/PROM Right 02/19/2021 Left 02/19/2021  Hip flexion      Hip extension      Hip abduction      Hip adduction      Hip internal rotation      Hip external rotation      Knee flexion      Knee extension      Ankle dorsiflexion      Ankle plantarflexion      Ankle inversion      Ankle eversion       (Blank rows = not tested)     LE MMT:   MMT Right 02/19/2021 Left 02/19/2021  Hip flexion      Hip extension      Hip abduction      Hip adduction      Hip internal rotation      Hip external rotation      Knee flexion      Knee extension      Ankle dorsiflexion      Ankle plantarflexion      Ankle inversion      Ankle eversion       (Blank rows = not tested) - LE Myotomal assessment was negative   LUMBAR SPECIAL TESTS:  Slump test: Negative   FUNCTIONAL TESTS:  NA   GAIT: Distance walked: Pt tolerated ambulation within clinic Assistive device utilized: None Level of assistance:  Complete Independence    [facility] Adult PT Treatment:  DATE: 1/26//23 Therapeutic Exercise: NuStep L4, 5 mins, UE/LE.  Seated trunk forward flexion c physioball x5, 20 sec Hamstring stretch, x2, 20"  PPT x10, 3 sec Marching c PPT, 2x10 each LE Bridging 2x10, 5" SLR c quad sets prior, x10, each LE HL alternating clams, 2x10, GTB, each LE Hip add ball squeeze, x15, 5" STS 2x5, with use of hands due to R knee pain during ex  Self Care: Use of abdominal engagement with sit to/from standing and sit to/from supine for lumbar stability and to manage pain    [facility] Adult PT Treatment:                                                DATE: 03/10/21 Therapeutic Exercise: NuStep L4, 5 mins, UE/LE.  Seated trunk forward flexion c physioball x5, 20 sec Hamstring stretch, x2, 20"  Marching c PPT, 2x10 each LE 90/90, march, x10 each LE Bridging 2x10, 5" HL alternating clams, 2x10, GTB, each LE Hip add ball squeeze, x15, 5" STS 2x5, with use of hands due to R knee pain during ex  Self Care: Seated PPT for lumbar movement to decrease pain with prolonged sitting and also to assist pt in finding a comfortable sitting position Recommended use of a seat cushion or lumbar support for situations where she knows she may encounter uncomfortable sitting situations    [facility] Adult PT Treatment:                                                DATE: 03/05/21  Therapeutic Exercise: NuStep L4, 3 mins, UE/LE. Seated trunk forward flexion c physioball x5, 20 sec  PPT, 10x, 3" Marching c PPT, x10 each LE Piriformis stretch,x2, 15" Bridging 2x10, 5" HL clams, 2x10, GTB Hip add ball squeeze,x15, 5" STS 2x5, without use of hands  Self Care: Discussed hurt vs. Harm with pt experiencing occasional pain with hips, knees, and low back with therex     PATIENT EDUCATION:  Education details: Eval findings, POC, HEP, sleeping positions and support for comfort, to walk  20-30 mins 4-5x/week, change positions every 15-30 mins. Person educated: Patient Education method: Explanation, Demonstration, Tactile cues, Verbal cues, and Handouts Education comprehension: verbalized understanding, returned demonstration, verbal cues required, and tactile cues required     HOME EXERCISE PROGRAM: Access Code: [email or web address] URL: [email or web address] Date: 03/12/2021 Prepared by: Gar Ponto  Exercises Seated Flexion Stretch with Swiss Ball - 3 x daily - 7 x weekly - 5 reps - 20 hold Supine Piriformis Stretch - 2 x daily - 7 x weekly - 1 sets - 10 reps - 15 hold Supine Posterior Pelvic Tilt - 2 x daily - 7 x weekly - 3 sets - 10 reps - 3 hold Supine March with Posterior Pelvic Tilt - 2 x daily - 7 x weekly - 1 sets - 10 reps Hooklying Clamshell with Resistance - 2 x daily - 7 x weekly - 1 sets - 10 reps - 3 hold Supine Hip Adduction Isometric with Ball - 2 x daily - 7 x weekly - 1 sets - 10 reps - 5 hold Supine Bridge - 2 x daily - 7 x weekly - 1 sets - 10 reps - 5 hold  Active Straight Leg Raise with Quad Set - 2 x daily - 7 x weekly - 3 sets - 10 reps - 3 hold    ASSESSMENT:   CLINICAL IMPRESSION: Pt is making appropriate progress with her low back pain and the whole body pain she was experiencing at night approved. STGs for an initial HEP and for better understanding of managing her symptoms met. PT was continued for lumbopelvic flexibility and strengthening. SLRs were added for quad/hip/abdominal strengthening. Pt tolerated today's session without adverse effects. Pt will continue to benefit from PT to optimize function and QOL c minimized pain.     REHAB POTENTIAL: Good   CLINICAL DECISION MAKING: Stable/uncomplicated   EVALUATION COMPLEXITY: Low     GOALS:   SHORT TERM GOALS:   STG Name Target Date Goal status  1 Pt will be Ind in an initial HEP Baseline: started on eval 03/12/21 Met 03/12/21  2 Pt will voice understanding of measure to  decrease and manage low back pain. 03/12/21- pt uses changing positions, recommended sleeping position, and HEP to manage pain Baseline:  03/12/21 Met 03/12/21    LONG TERM GOALS:    LTG Name Target Date Goal status  1 Pt will report a decrease in low back and L LE pain to 3/10 or less with daily activities Baseline:7/10 04/08/21 INITIAL  2 Pt will demonstrate full trunk ROM with low back and L LE pain reproduction of 2/10 or less Baseline: Min decrease R lat flexion. Significant pain provoked c R lat flexion and with ext 04/08/21 INITIAL  3 Pt will be able to walk and stand for 30 mins with 2/10 or less low back and L LE pain reproduction  Baseline: report of 15 mins 04/08/21 INITIAL  4 Pt will be Ind in a final HEP to maintain achieved level of function Baseline: 04/08/21 INITIAL    PLAN: PT FREQUENCY: 2x/week   PT DURATION: 6 weeks   PLANNED INTERVENTIONS: Therapeutic exercises, Therapeutic activity, Neuro Muscular re-education, Gait training, Patient/Family education, Joint mobilization, Dry Needling, Electrical stimulation, Spinal mobilization, Moist heat, Taping, Traction, Ultrasound, Ionotophoresis 61m/ml Dexamethasone, and Manual therapy   PLAN FOR NEXT SESSION: Assess pt's response to HEP, progress therex as indicated, use of modalities and manual therapy as indicated    ALiberty MutualMS, PT 03/12/21 4:34 PM

## 2021-03-13 ENCOUNTER — Ambulatory Visit: Payer: Medicare Other | Admitting: Family

## 2021-03-17 ENCOUNTER — Ambulatory Visit: Payer: Medicare Other

## 2021-03-17 NOTE — Therapy (Incomplete)
OUTPATIENT PHYSICAL THERAPY TREATMENT NOTE   Patient Name: Brittney [name] MRN: [id number] DOB:[birth year], 51 y.o., female Today's Date: 03/17/2021  PCP: Sandrea Hughs, NP REFERRING PROVIDER: Sandrea Hughs, NP       Past Medical History:  Diagnosis Date   Abscess    to eyelid   Anxiety    Arthritis    KNEES ELBOWS HIPS SHOULDER FINGERS   Asthma    Back pain    Bipolar disorder ([place])    Depression    BIPOLAR   Dyspnea    Fibromyalgia    GERD (gastroesophageal reflux disease)    Headache syndrome 12/12/2019   Headache(784.0)    MIGRAINES   History of colonoscopy    History of degenerative disc disease    Hyperlipidemia    Major depressive disorder    Memory difficulties 06/21/2014   Neck pain    OA (osteoarthritis) of knee    Obesity    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   Rheumatoid arthritis ([place])    Seizures ([place])    FACIAL SEIZURES LAST 4 DAYS AGO   Past Surgical History:  Procedure Laterality Date   CARDIAC CATHETERIZATION  2010   DILATION AND CURETTAGE OF UTERUS  1994   LAPAROSCOPY N/A 11/21/2012   Procedure: LAPAROSCOPY OPERATIVE REMOVAL OF RIGHT TUBE AND OVARY AND FLUID FROM MASS;  Surgeon: Melina Schools, MD;  Location: [facility];  Service: Gynecology;  Laterality: N/A;  1 1/2hrs OR time   OOPHORECTOMY     Patient Active Problem List   Diagnosis Date Noted   Back pain with radiculopathy 01/07/2021   Hyperlipidemia    OA (osteoarthritis) of knee    GERD (gastroesophageal reflux disease)    Rheumatoid arthritis ([place])    Headache syndrome 12/12/2019   Bipolar 1 disorder, depressed, moderate ([place]) 12/14/2018   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   Fibromyalgia 02/03/2015   Tendinitis of both rotator cuffs 02/03/2015   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   Insomnia secondary to chronic pain 11/13/2014   Fatigue due to sleep pattern disturbance 11/13/2014   Memory difficulties 06/21/2014    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   PTSD (post-traumatic stress disorder) 12/13/2013   Bipolar depression ([place]) 12/12/2013   PERICARDIAL EFFUSION 12/23/2008   PSEUDOTUMOR CEREBRI 12/05/2008   UNSPECIFIED PLEURAL EFFUSION 12/05/2008   DYSPNEA 12/05/2008   OBESITY 11/19/2008   Migraine 11/19/2008   ASTHMA 11/19/2008   OTHER SPECIFIED DISORDER OF STOMACH AND DUODENUM 03/11/2008    REFERRING DIAG: Lumbar back pain with radiculopathy affecting lower extremity   THERAPY DIAG:  No diagnosis found.  PERTINENT HISTORY: OA, fibromyalgia, RA, HA, anxiety, obesity    PRECAUTIONS: none  SUBJECTIVE: Pt's reports her low back is doing well with no pain today or yesterday. She notes the whole body night pain she has been having is improved as well.  PAIN:  Are you having pain? Not currently. NPRS scale: 0/10 Pain location: low back Pain orientation: Posterior and Lower  PAIN TYPE: aching, burning, sharp, and throbbing Pain description: intermittent  Aggravating factors: Too much activity, sitting, standing Relieving factors: Lying down, ice, heat  OBJECTIVE:    DIAGNOSTIC FINDINGS:    FINDINGS: There are 5 non-rib-bearing lumbar vertebra. No acute fracture. Vertebral body heights are normal. Posterior elements are intact. Trace anterolisthesis of L3 and L4, trace retrolisthesis of L4 on L5 and L5 on S1. minor endplate spurring at multiple levels with slight disc space narrowing at L4-L5. There is  facet hypertrophy at L2-L3 and L3-L4. No evidence of focal bone lesion or bony destruction. Sacroiliac joints are congruent.   IMPRESSION: 1. No acute fracture of the lumbar spine. 2. Mild multilevel spondylosis and facet hypertrophy.   SCREENING FOR RED FLAGS: Bowel or bladder incontinence: No   Cauda equina syndrome: No   COGNITION:          Overall cognitive status: Within functional limits for tasks assessed                        SENSATION:          Light touch:  Appears intact   MUSCLE LENGTH: Hamstrings: Right TBA deg; Left TBA deg Marcello Moores test: Right TBA deg; Left TBA deg   POSTURE:  Increased lumbar lordosis which is not reversed with forward flexion, ant. pelvis rotation   PALPATION: TTP of the L paraspinals   LUMBARAROM/PROM   AROM AROM  02/19/2021  Flexion Full  Extension Full, provoked pain  Right lateral flexion Min decresae, provoked pain  Left lateral flexion Full  Right rotation Full  Left rotation Full   (Blank rows = not tested)   LE AROM/PROM:   A/PROM Right 02/19/2021 Left 02/19/2021  Hip flexion      Hip extension      Hip abduction      Hip adduction      Hip internal rotation      Hip external rotation      Knee flexion      Knee extension      Ankle dorsiflexion      Ankle plantarflexion      Ankle inversion      Ankle eversion       (Blank rows = not tested)     LE MMT:   MMT Right 02/19/2021 Left 02/19/2021  Hip flexion      Hip extension      Hip abduction      Hip adduction      Hip internal rotation      Hip external rotation      Knee flexion      Knee extension      Ankle dorsiflexion      Ankle plantarflexion      Ankle inversion      Ankle eversion       (Blank rows = not tested) - LE Myotomal assessment was negative   LUMBAR SPECIAL TESTS:  Slump test: Negative   FUNCTIONAL TESTS:  NA   GAIT: Distance walked: Pt tolerated ambulation within clinic Assistive device utilized: None Level of assistance: Complete Independence  [facility] Adult PT Treatment:                                                DATE: 03/17/21 Therapeutic Exercise: NuStep L4, 5 mins, UE/LE.  Seated trunk forward flexion c physioball x5, 20 sec Hamstring stretch, x2, 20"  PPT x10, 3 sec Marching c PPT, 2x10 each LE Bridging 2x10, 5" SLR c quad sets prior, x10, each LE HL alternating clams, 2x10, GTB, each LE Hip add ball squeeze, x15, 5" STS 2x5, with use of hands due to R knee pain during ex Manual  Therapy: *** Neuromuscular re-ed: *** Therapeutic Activity: *** Modalities: *** Self Care: ***   [facility] Adult PT Treatment:  DATE: 1/26//23 Therapeutic Exercise: NuStep L4, 5 mins, UE/LE.  Seated trunk forward flexion c physioball x5, 20 sec Hamstring stretch, x2, 20"  PPT x10, 3 sec Marching c PPT, 2x10 each LE Bridging 2x10, 5" SLR c quad sets prior, x10, each LE HL alternating clams, 2x10, GTB, each LE Hip add ball squeeze, x15, 5" STS 2x5, with use of hands due to R knee pain during ex  Self Care: Use of abdominal engagement with sit to/from standing and sit to/from supine for lumbar stability and to manage pain    [facility] Adult PT Treatment:                                                DATE: 03/10/21 Therapeutic Exercise: NuStep L4, 5 mins, UE/LE.  Seated trunk forward flexion c physioball x5, 20 sec Hamstring stretch, x2, 20"  Marching c PPT, 2x10 each LE 90/90, march, x10 each LE Bridging 2x10, 5" HL alternating clams, 2x10, GTB, each LE Hip add ball squeeze, x15, 5" STS 2x5, with use of hands due to R knee pain during ex  Self Care: Seated PPT for lumbar movement to decrease pain with prolonged sitting and also to assist pt in finding a comfortable sitting position Recommended use of a seat cushion or lumbar support for situations where she knows she may encounter uncomfortable sitting situations    [facility] Adult PT Treatment:                                                DATE: 03/05/21  Therapeutic Exercise: NuStep L4, 3 mins, UE/LE. Seated trunk forward flexion c physioball x5, 20 sec  PPT, 10x, 3" Marching c PPT, x10 each LE Piriformis stretch,x2, 15" Bridging 2x10, 5" HL clams, 2x10, GTB Hip add ball squeeze,x15, 5" STS 2x5, without use of hands  Self Care: Discussed hurt vs. Harm with pt experiencing occasional pain with hips, knees, and low back with therex     PATIENT EDUCATION:  Education details:  Eval findings, POC, HEP, sleeping positions and support for comfort, to walk 20-30 mins 4-5x/week, change positions every 15-30 mins. Person educated: Patient Education method: Explanation, Demonstration, Tactile cues, Verbal cues, and Handouts Education comprehension: verbalized understanding, returned demonstration, verbal cues required, and tactile cues required     HOME EXERCISE PROGRAM: Access Code: [email or web address] URL: [email or web address] Date: 03/12/2021 Prepared by: Gar Ponto  Exercises Seated Flexion Stretch with Swiss Ball - 3 x daily - 7 x weekly - 5 reps - 20 hold Supine Piriformis Stretch - 2 x daily - 7 x weekly - 1 sets - 10 reps - 15 hold Supine Posterior Pelvic Tilt - 2 x daily - 7 x weekly - 3 sets - 10 reps - 3 hold Supine March with Posterior Pelvic Tilt - 2 x daily - 7 x weekly - 1 sets - 10 reps Hooklying Clamshell with Resistance - 2 x daily - 7 x weekly - 1 sets - 10 reps - 3 hold Supine Hip Adduction Isometric with Ball - 2 x daily - 7 x weekly - 1 sets - 10 reps - 5 hold Supine Bridge - 2 x daily - 7 x weekly - 1 sets - 10 reps - 5 hold  Active Straight Leg Raise with Quad Set - 2 x daily - 7 x weekly - 3 sets - 10 reps - 3 hold    ASSESSMENT:   CLINICAL IMPRESSION: Pt is making appropriate progress with her low back pain and the whole body pain she was experiencing at night approved. STGs for an initial HEP and for better understanding of managing her symptoms met. PT was continued for lumbopelvic flexibility and strengthening. SLRs were added for quad/hip/abdominal strengthening. Pt tolerated today's session without adverse effects. Pt will continue to benefit from PT to optimize function and QOL c minimized pain.     REHAB POTENTIAL: Good   CLINICAL DECISION MAKING: Stable/uncomplicated   EVALUATION COMPLEXITY: Low     GOALS:   SHORT TERM GOALS:   STG Name Target Date Goal status  1 Pt will be Ind in an initial HEP Baseline: started on  eval 03/12/21 Met 03/12/21  2 Pt will voice understanding of measure to decrease and manage low back pain. 03/12/21- pt uses changing positions, recommended sleeping position, and HEP to manage pain Baseline:  03/12/21 Met 03/12/21    LONG TERM GOALS:    LTG Name Target Date Goal status  1 Pt will report a decrease in low back and L LE pain to 3/10 or less with daily activities Baseline:7/10 04/08/21 INITIAL  2 Pt will demonstrate full trunk ROM with low back and L LE pain reproduction of 2/10 or less Baseline: Min decrease R lat flexion. Significant pain provoked c R lat flexion and with ext 04/08/21 INITIAL  3 Pt will be able to walk and stand for 30 mins with 2/10 or less low back and L LE pain reproduction  Baseline: report of 15 mins 04/08/21 INITIAL  4 Pt will be Ind in a final HEP to maintain achieved level of function Baseline: 04/08/21 INITIAL    PLAN: PT FREQUENCY: 2x/week   PT DURATION: 6 weeks   PLANNED INTERVENTIONS: Therapeutic exercises, Therapeutic activity, Neuro Muscular re-education, Gait training, Patient/Family education, Joint mobilization, Dry Needling, Electrical stimulation, Spinal mobilization, Moist heat, Taping, Traction, Ultrasound, Ionotophoresis 34m/ml Dexamethasone, and Manual therapy   PLAN FOR NEXT SESSION: Assess pt's response to HEP, progress therex as indicated, use of modalities and manual therapy as indicated    [employer]MS, PT 03/17/21 6:05 AM

## 2021-03-18 NOTE — Therapy (Signed)
OUTPATIENT PHYSICAL THERAPY TREATMENT NOTE   Patient Name: Brittney [name] MRN: [id number] DOB:1970/08/10, 51 y.o., female Today's Date: 03/19/2021  PCP: Sandrea Hughs, NP REFERRING PROVIDER: Eunice Blase, MD   PT End of Session - 03/19/21 1515     Visit Number 6    Number of Visits 13    Date for PT Re-Evaluation 04/08/21    Authorization Type [employer]IC[employer]; MEDICAID [place] ACCESS    Progress Note Due on Visit 10    PT Start Time 1503    PT Stop Time 1548    PT Time Calculation (min) 45 min    Activity Tolerance Patient tolerated treatment well    Behavior During Therapy WFL for tasks assessed/performed                Past Medical History:  Diagnosis Date   Abscess    to eyelid   Anxiety    Arthritis    KNEES ELBOWS HIPS SHOULDER FINGERS   Asthma    Back pain    Bipolar disorder (HCC)    Depression    BIPOLAR   Dyspnea    Fibromyalgia    GERD (gastroesophageal reflux disease)    Headache syndrome 12/12/2019   Headache(784.0)    MIGRAINES   History of colonoscopy    History of degenerative disc disease    Hyperlipidemia    Major depressive disorder    Memory difficulties 06/21/2014   Neck pain    OA (osteoarthritis) of knee    Obesity    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   Rheumatoid arthritis ([place])    Seizures ([place])    FACIAL SEIZURES LAST 4 DAYS AGO   Past Surgical History:  Procedure Laterality Date   CARDIAC CATHETERIZATION  2010   DILATION AND CURETTAGE OF UTERUS  1994   LAPAROSCOPY N/A 11/21/2012   Procedure: LAPAROSCOPY OPERATIVE REMOVAL OF RIGHT TUBE AND OVARY AND FLUID FROM MASS;  Surgeon: Melina Schools, MD;  Location: [facility];  Service: Gynecology;  Laterality: N/A;  1 1/2hrs OR time   OOPHORECTOMY     Patient Active Problem List   Diagnosis Date Noted   Back pain with radiculopathy 01/07/2021   Hyperlipidemia    OA (osteoarthritis) of knee    GERD (gastroesophageal reflux disease)    Rheumatoid arthritis ([place])     Headache syndrome 12/12/2019   Bipolar 1 disorder, depressed, moderate ([place]) 12/14/2018   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   Fibromyalgia 02/03/2015   Tendinitis of both rotator cuffs 02/03/2015   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   Insomnia secondary to chronic pain 11/13/2014   Fatigue due to sleep pattern disturbance 11/13/2014   Memory difficulties 06/21/2014   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   PTSD (post-traumatic stress disorder) 12/13/2013   Bipolar depression ([place]) 12/12/2013   PERICARDIAL EFFUSION 12/23/2008   PSEUDOTUMOR CEREBRI 12/05/2008   UNSPECIFIED PLEURAL EFFUSION 12/05/2008   DYSPNEA 12/05/2008   OBESITY 11/19/2008   Migraine 11/19/2008   ASTHMA 11/19/2008   OTHER SPECIFIED DISORDER OF STOMACH AND DUODENUM 03/11/2008    REFERRING DIAG: Lumbar back pain with radiculopathy affecting lower extremity   THERAPY DIAG:  Lumbar back pain with radiculopathy affecting lower extremity  Abnormal posture  PERTINENT HISTORY: OA, fibromyalgia, RA, HA, anxiety, obesity    PRECAUTIONS: none  SUBJECTIVE: Pt's reports her low back is doing well with no pain today or yesterday. She notes the whole body night pain she has been having  is improved as well.  PAIN:  Are you having pain? Not currently. NPRS scale: 0/10 Pain location: low back Pain orientation: Posterior and Lower  PAIN TYPE: aching, burning, sharp, and throbbing Pain description: intermittent  Aggravating factors: Too much activity, sitting, standing Relieving factors: Lying down, ice, heat  OBJECTIVE:    DIAGNOSTIC FINDINGS:    FINDINGS: There are 5 non-rib-bearing lumbar vertebra. No acute fracture. Vertebral body heights are normal. Posterior elements are intact. Trace anterolisthesis of L3 and L4, trace retrolisthesis of L4 on L5 and L5 on S1. minor endplate spurring at multiple levels with slight disc space narrowing at L4-L5. There is  facet hypertrophy at L2-L3 and L3-L4. No evidence of focal bone lesion or bony destruction. Sacroiliac joints are congruent.   IMPRESSION: 1. No acute fracture of the lumbar spine. 2. Mild multilevel spondylosis and facet hypertrophy.   SCREENING FOR RED FLAGS: Bowel or bladder incontinence: No   Cauda equina syndrome: No   COGNITION:          Overall cognitive status: Within functional limits for tasks assessed                        SENSATION:          Light touch: Appears intact   MUSCLE LENGTH: Hamstrings: Right TBA deg; Left TBA deg Marcello Moores test: Right TBA deg; Left TBA deg   POSTURE:  Increased lumbar lordosis which is not reversed with forward flexion, ant. pelvis rotation   PALPATION: TTP of the L paraspinals   LUMBARAROM/PROM   AROM AROM  02/19/2021  Flexion Full  Extension Full, provoked pain  Right lateral flexion Min decresae, provoked pain  Left lateral flexion Full  Right rotation Full  Left rotation Full   (Blank rows = not tested)   LE AROM/PROM:   A/PROM Right 02/19/2021 Left 02/19/2021  Hip flexion      Hip extension      Hip abduction      Hip adduction      Hip internal rotation      Hip external rotation      Knee flexion      Knee extension      Ankle dorsiflexion      Ankle plantarflexion      Ankle inversion      Ankle eversion       (Blank rows = not tested)     LE MMT:   MMT Right 02/19/2021 Left 02/19/2021  Hip flexion      Hip extension      Hip abduction      Hip adduction      Hip internal rotation      Hip external rotation      Knee flexion      Knee extension      Ankle dorsiflexion      Ankle plantarflexion      Ankle inversion      Ankle eversion       (Blank rows = not tested) - LE Myotomal assessment was negative   LUMBAR SPECIAL TESTS:  Slump test: Negative   FUNCTIONAL TESTS:  NA   GAIT: Distance walked: Pt tolerated ambulation within clinic Assistive device utilized: None Level of assistance:  Complete Independence   [facility] Adult PT Treatment:  DATE: 03/19/21 Therapeutic Exercise: NuStep L4, 5 mins, UE/LE.  Seated trunk forward flexion c physioball x5, 20 sec Seated abdominal presses 2x10 Hamstring stretch, x2, 20"  Bridging 2x10, 5" SLR c quad sets prior, x10, 4#, each LE SL hip abd, x12, each LE SL clam, x12, each LE Shoulder row, 2x10, GTB Shoulder ext, 2x10, GTB  Self care: Updated HEP. Education regarding progression of walking and jogging in pool.    [facility] Adult PT Treatment:                                                DATE: 1/26//23 Therapeutic Exercise: NuStep L4, 5 mins, UE/LE.  Seated trunk forward flexion c physioball x5, 20 sec Hamstring stretch, x2, 20"  PPT x10, 3 sec Marching c PPT, 2x10 each LE Bridging 2x10, 5" SLR c quad sets prior, x10, each LE HL alternating clams, 2x10, GTB, each LE Hip add ball squeeze, x15, 5" STS 2x5, with use of hands due to R knee pain during ex  Self Care: Use of abdominal engagement with sit to/from standing and sit to/from supine for lumbar stability and to manage pain    [facility] Adult PT Treatment:                                                DATE: 03/10/21 Therapeutic Exercise: NuStep L4, 5 mins, UE/LE.  Seated trunk forward flexion c physioball x5, 20 sec Hamstring stretch, x2, 20"  Marching c PPT, 2x10 each LE 90/90, march, x10 each LE Bridging 2x10, 5" HL alternating clams, 2x10, GTB, each LE Hip add ball squeeze, x15, 5" STS 2x5, with use of hands due to R knee pain during ex  Self Care: Seated PPT for lumbar movement to decrease pain with prolonged sitting and also to assist pt in finding a comfortable sitting position Recommended use of a seat cushion or lumbar support for situations where she knows she may encounter uncomfortable sitting situations       PATIENT EDUCATION:  Education details: Eval findings, POC, HEP, sleeping positions and support for  comfort, to walk 20-30 mins 4-5x/week, change positions every 15-30 mins. Person educated: Patient Education method: Explanation, Demonstration, Tactile cues, Verbal cues, and Handouts Education comprehension: verbalized understanding, returned demonstration, verbal cues required, and tactile cues required     HOME EXERCISE PROGRAM: Access Code: [email or web address] URL: [email or web address] Date: 03/19/2021 Prepared by: Gar Ponto  Exercises Seated Flexion Stretch with Swiss Ball - 3 x daily - 7 x weekly - 5 reps - 20 hold Supine Piriformis Stretch - 2 x daily - 7 x weekly - 1 sets - 10 reps - 15 hold Supine Posterior Pelvic Tilt - 2 x daily - 7 x weekly - 3 sets - 10 reps - 3 hold Supine March with Posterior Pelvic Tilt - 2 x daily - 7 x weekly - 2 sets - 10 reps Hooklying Clamshell with Resistance - 2 x daily - 7 x weekly - 2 sets - 10 reps - 3 hold Supine Hip Adduction Isometric with Ball - 2 x daily - 7 x weekly - 2 sets - 10 reps - 5 hold Supine Bridge - 2 x daily - 7 x weekly - 2 sets -  10 reps - 5 hold Active Straight Leg Raise with Quad Set - 2 x daily - 7 x weekly - 3 sets - 10 reps - 3 hold Standing Shoulder Row with Anchored Resistance - 1 x daily - 7 x weekly - 2 sets - 10 reps - 3 hold Shoulder extension with resistance - Neutral - 1 x daily - 7 x weekly - 2 sets - 10 reps - 3 hold     ASSESSMENT:   CLINICAL IMPRESSION: PT was completed for core and LE strengthening. Therex were progressed for greater physical demand. Following the session, pt's low back pain decreased from 4/10 to 0/10. Provided education re: progression with walking and jogging program in a pool. Pt voiced understanding.     REHAB POTENTIAL: Good   CLINICAL DECISION MAKING: Stable/uncomplicated   EVALUATION COMPLEXITY: Low     GOALS:   SHORT TERM GOALS:   STG Name Target Date Goal status  1 Pt will be Ind in an initial HEP Baseline: started on eval 03/12/21 Met 03/12/21  2 Pt will  voice understanding of measure to decrease and manage low back pain. 03/12/21- pt uses changing positions, recommended sleeping position, and HEP to manage pain Baseline:  03/12/21 Met 03/12/21    LONG TERM GOALS:    LTG Name Target Date Goal status  1 Pt will report a decrease in low back and L LE pain to 3/10 or less with daily activities Baseline:7/10 04/08/21 INITIAL  2 Pt will demonstrate full trunk ROM with low back and L LE pain reproduction of 2/10 or less Baseline: Min decrease R lat flexion. Significant pain provoked c R lat flexion and with ext 04/08/21 INITIAL  3 Pt will be able to walk and stand for 30 mins with 2/10 or less low back and L LE pain reproduction  Baseline: report of 15 mins 04/08/21 INITIAL  4 Pt will be Ind in a final HEP to maintain achieved level of function Baseline: 04/08/21 INITIAL    PLAN: PT FREQUENCY: 2x/week   PT DURATION: 6 weeks   PLANNED INTERVENTIONS: Therapeutic exercises, Therapeutic activity, Neuro Muscular re-education, Gait training, Patient/Family education, Joint mobilization, Dry Needling, Electrical stimulation, Spinal mobilization, Moist heat, Taping, Traction, Ultrasound, Ionotophoresis 59m/ml Dexamethasone, and Manual therapy   PLAN FOR NEXT SESSION: Assess pt's response to HEP, progress therex as indicated, use of modalities and manual therapy as indicated. Reassess LTGs.   Sallie Staron MS, PT 03/19/21 4:30 PM

## 2021-03-19 ENCOUNTER — Ambulatory Visit: Payer: Medicare Other | Attending: Family Medicine

## 2021-03-19 ENCOUNTER — Other Ambulatory Visit: Payer: Self-pay

## 2021-03-19 DIAGNOSIS — M5416 Radiculopathy, lumbar region: Secondary | ICD-10-CM | POA: Insufficient documentation

## 2021-03-19 DIAGNOSIS — R293 Abnormal posture: Secondary | ICD-10-CM | POA: Insufficient documentation

## 2021-03-24 ENCOUNTER — Ambulatory Visit: Payer: Medicare Other

## 2021-03-24 ENCOUNTER — Other Ambulatory Visit: Payer: Self-pay

## 2021-03-24 DIAGNOSIS — M5416 Radiculopathy, lumbar region: Secondary | ICD-10-CM | POA: Diagnosis not present

## 2021-03-24 DIAGNOSIS — R293 Abnormal posture: Secondary | ICD-10-CM
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
## 2021-03-26 ENCOUNTER — Other Ambulatory Visit: Payer: Self-pay

## 2021-03-26 ENCOUNTER — Ambulatory Visit: Payer: Medicare Other

## 2021-03-26 DIAGNOSIS — R293 Abnormal posture: Secondary | ICD-10-CM

## 2021-03-26 DIAGNOSIS — M5416 Radiculopathy, lumbar region: Secondary | ICD-10-CM | POA: Diagnosis not present
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
## 2021-03-31 ENCOUNTER — Other Ambulatory Visit: Payer: Self-pay

## 2021-03-31 ENCOUNTER — Ambulatory Visit: Payer: Medicare Other

## 2021-03-31 DIAGNOSIS — M5416 Radiculopathy, lumbar region: Secondary | ICD-10-CM

## 2021-03-31 DIAGNOSIS — R293 Abnormal posture: Secondary | ICD-10-CM
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
## 2021-04-02 ENCOUNTER — Ambulatory Visit (HOSPITAL_COMMUNITY)
Admission: RE | Admit: 2021-04-02 | Discharge: 2021-04-02 | Disposition: A | Discharge status: Home / Self Care | Payer: Medicare Other | Source: Ambulatory Visit | Attending: Physician Assistant | Admitting: Physician Assistant

## 2021-04-02 ENCOUNTER — Ambulatory Visit: Payer: Medicare Other

## 2021-04-02 ENCOUNTER — Encounter (HOSPITAL_COMMUNITY): Payer: Self-pay

## 2021-04-02 ENCOUNTER — Other Ambulatory Visit: Payer: Self-pay

## 2021-04-02 ENCOUNTER — Ambulatory Visit (INDEPENDENT_AMBULATORY_CARE_PROVIDER_SITE_OTHER): Payer: Medicare Other

## 2021-04-02 VITALS — BP 122/87 | HR 68 | Temp 98.6°F

## 2021-04-02 DIAGNOSIS — M5442 Lumbago with sciatica, left side: Secondary | ICD-10-CM

## 2021-04-02 DIAGNOSIS — M5441 Lumbago with sciatica, right side: Secondary | ICD-10-CM

## 2021-04-02 DIAGNOSIS — M5416 Radiculopathy, lumbar region: Secondary | ICD-10-CM

## 2021-04-02 DIAGNOSIS — M545 Low back pain, unspecified: Secondary | ICD-10-CM | POA: Diagnosis not present

## 2021-04-02 MED ORDER — CYCLOBENZAPRINE HCL 10 MG PO TABS: 10.0000 mg | ORAL_TABLET | Freq: Three times a day (TID) | ORAL | 0 refills | Status: DC | PRN

## 2021-04-02 MED ORDER — LIDOCAINE 4 % EX PTCH: MEDICATED_PATCH | CUTANEOUS | 0 refills | Status: DC
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
## 2021-04-02 NOTE — Discharge Instructions (Addendum)
Your x-ray was normal.  I do recommend that you follow-up with a specialist as you may need an MRI.  Take Flexeril as needed for pain.  This can make you sleepy so do not drive or drink alcohol with taking it.  Use lidocaine patches.  You should wear this for 12 hours then remove it for 12 hours before placing another 1.  Only use 1 patch per 24 hours.  Use heat, rest, stretch for additional symptom relief.  If you develop any severe symptoms including including severe pain, with difficulty walking, numbness of your legs, bowel/bladder continence you need to go to the emergency room.

## 2021-04-03 ENCOUNTER — Ambulatory Visit: Payer: Medicare Other

## 2021-04-03 DIAGNOSIS — M5416 Radiculopathy, lumbar region: Secondary | ICD-10-CM | POA: Diagnosis not present

## 2021-04-03 DIAGNOSIS — R293 Abnormal posture: Secondary | ICD-10-CM
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
## 2021-04-09 ENCOUNTER — Ambulatory Visit: Payer: Medicare Other

## 2021-04-09 ENCOUNTER — Other Ambulatory Visit: Payer: Self-pay

## 2021-04-09 DIAGNOSIS — R293 Abnormal posture: Secondary | ICD-10-CM

## 2021-04-09 DIAGNOSIS — M5416 Radiculopathy, lumbar region: Secondary | ICD-10-CM | POA: Diagnosis not present

## 2021-04-15 ENCOUNTER — Ambulatory Visit: Payer: Medicare Other
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
## 2021-04-17 ENCOUNTER — Ambulatory Visit: Payer: Medicare Other

## 2021-04-24 ENCOUNTER — Ambulatory Visit: Payer: Medicare Other

## 2021-04-29 ENCOUNTER — Ambulatory Visit: Payer: Medicare Other

## 2021-05-01 ENCOUNTER — Ambulatory Visit: Payer: Medicare Other

## 2021-06-21 NOTE — Progress Notes (Signed)
? ? ?PATIENT: Brittney [name] ?DOB: 09/18/1970 ? ?REASON FOR VISIT: follow up ?HISTORY FROM: patient ?PRIMARY NEUROLOGIST: Dr. Jannifer Franklin ? ?Chief Complaint  ?Patient presents with  ? Migraine  ?  Rm 4, alone. Last seen 2021. Migraines started again one months ago.  (2-3 x week).  Tylenol which does not help. Dizzness- prior to getting migraines. (Lightheadedness/balance off). Nauseaous.    ? ? ? ? ? ? ?HISTORY OF PRESENT ILLNESS: ?Today 06/21/21: ? ?Brittney [name] is a 51 year old female  ?Headaches started back about 1 month ago. Usually starts with mild dizziness. Has events 2-3 times a week. Pain 8/10. Location varies. Photophobia and phonophobia. Occasionally has nausea typically no vomiting. No numbness and tingling in the extremities.  ? ?MRI WO contrast in 2016:IMPRESSION:  This MRI of the brain without contrast shows a mildly enlarged sella turcica with thinned pituitary tissue. Although nonspecific this is commonly seen with pseudotumor cerebri. There are no acute findings. ? ?LP showed opening pressure 21 ? ?Dr. Jannifer Franklin did not think it was consistent with pseudotumor cerebri. ? ? ?Reports that she is having all over body pain. Saw orthopedist.  In the past was told she was told she had fibromyalgia. Not currrently being treated for that. Unable to tolerate lyrica.  ? ?Patient feels that her migraines and is left-sided body pain may be interrelated.  I have not seen notes from orthopedist. ? ? ?HISTORY Brittney [name] is a 51 year old right-handed black female with a history of fibromyalgia and rheumatoid arthritis.  The patient was seen through this office in 2016.  She returns today with very similar complaints.  The patient has a history of what was felt to be pseudotumor cerebri, but a work-up in 2016 indicated that she did not have this clinical syndrome.  She has had headaches off and on for many years, over the last 2 months her headaches have been daily in nature.  The headaches are all over the head and may be  associated with some blurring of vision in the occasional nausea without vomiting.  She indicates that the headaches are usually better in the morning and worse as the day goes on.  She reports a chronic issue with cognitive dysfunction, she was complaining of some memory problems back in 2016.  A Mini-Mental Status Examination at that time was 30/30.  The patient apparently contracted the Covid virus in April 2021.  She has come off of all of her medications since that time.  She was on methotrexate and Enbrel for her rheumatoid arthritis, but she has not taken these medications in over 6 months.  The patient does try to stay active, she engages in yoga and water aerobics.  If she overdoes, she will have a "fibroflare".  She does have some joint pains in the hips and knees associated with a rheumatoid arthritis.  The patient reports some numbness and tingling in the fingers, she feels that the hands are weak.  She does have some balance issues, she may stumble or fall on occasion.  She denies issues controlling the bowels or the bladder.  She did have the diagnosis of irritable bowel syndrome but this improved with an alteration in her diet.  The patient was given Cymbalta and Lyrica for the fibromyalgia symptoms through her primary doctor, but she never took these medications.  The patient is not clear that she wants to go on any medications at this time.  She reports that she may lose her train of thought  at times when she is conversing with someone.  The patient is still working, she is able to manage her day-to-day affairs fairly well.  She comes to the office today for an evaluation. ? Brittney [name] is a 51 year old right-handed black female with a history of fibromyalgia and rheumatoid arthritis.  The patient was seen through this office in 2016.  She returns today with very similar complaints.  The patient has a history of what was felt to be pseudotumor cerebri, but a work-up in 2016 indicated that she did not  have this clinical syndrome.  She has had headaches off and on for many years, over the last 2 months her headaches have been daily in nature.  The headaches are all over the head and may be associated with some blurring of vision in the occasional nausea without vomiting.  She indicates that the headaches are usually better in the morning and worse as the day goes on.  She reports a chronic issue with cognitive dysfunction, she was complaining of some memory problems back in 2016.  A Mini-Mental Status Examination at that time was 30/30.  The patient apparently contracted the Covid virus in April 2021.  She has come off of all of her medications since that time.  She was on methotrexate and Enbrel for her rheumatoid arthritis, but she has not taken these medications in over 6 months.  The patient does try to stay active, she engages in yoga and water aerobics.  If she overdoes, she will have a "fibroflare".  She does have some joint pains in the hips and knees associated with a rheumatoid arthritis.  The patient reports some numbness and tingling in the fingers, she feels that the hands are weak.  She does have some balance issues, she may stumble or fall on occasion.  She denies issues controlling the bowels or the bladder.  She did have the diagnosis of irritable bowel syndrome but this improved with an alteration in her diet.  The patient was given Cymbalta and Lyrica for the fibromyalgia symptoms through her primary doctor, but she never took these medications.  The patient is not clear that she wants to go on any medications at this time.  She reports that she may lose her train of thought at times when she is conversing with someone.  The patient is still working, she is able to manage her day-to-day affairs fairly well.  She comes to the office today for an evaluation. ?  ? ?REVIEW OF SYSTEMS: Out of a complete 14 system review of symptoms, the patient complains only of the following symptoms, and all other  reviewed systems are negative. ? ?ALLERGIES: ?Allergies  ?Allergen Reactions  ? Carbamazepine Rash  ? Nsaids Swelling  ?   Pt says she can take ibuprofen  ? ? ?HOME MEDICATIONS: ?Outpatient Medications Prior to Visit  ?Medication Sig Dispense Refill  ? cyclobenzaprine (FLEXERIL) 10 MG tablet Take 1 tablet (10 mg total) by mouth 3 (three) times daily as needed for muscle spasms. 30 tablet 0  ? fluticasone (FLONASE) 50 MCG/ACT nasal spray Place 2 sprays into both nostrils daily. 16 g 0  ? ibuprofen (ADVIL) 600 MG tablet Take 600 mg by mouth every 6 (six) hours as needed.    ? Lidocaine (HM LIDOCAINE PATCH) 4 % PTCH Place lidocaine patch over affected area and leave on for up to 12 hours.  Remove after 12 hours and do not place another patch for 12 hours; 1 patch per 24  hours. 15 patch 0  ? pregabalin (LYRICA) 50 MG capsule Take 1 capsule (50 mg total) by mouth 3 (three) times daily. 30 capsule 0  ? ?No facility-administered medications prior to visit.  ? ? ?PAST MEDICAL HISTORY: ?Past Medical History:  ?Diagnosis Date  ? Abscess   ? to eyelid  ? Anxiety   ? Arthritis   ? KNEES ELBOWS HIPS SHOULDER FINGERS  ? Asthma   ? Back pain   ? Bipolar disorder ([place])   ? Depression   ? BIPOLAR  ? Dyspnea   ? Fibromyalgia   ? GERD (gastroesophageal reflux disease)   ? Headache syndrome 12/12/2019  ? Headache(784.0)   ? MIGRAINES  ? History of colonoscopy   ? History of degenerative disc disease   ? Hyperlipidemia   ? Major depressive disorder   ? Memory difficulties 06/21/2014  ? Neck pain   ? OA (osteoarthritis) of knee   ? Obesity   ? 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  ? Rheumatoid arthritis ([place])   ? Seizures ([place])   ? FACIAL SEIZURES LAST 4 DAYS AGO  ? ? ?PAST SURGICAL HISTORY: ?Past Surgical History:  ?Procedure Laterality Date  ? CARDIAC CATHETERIZATION  2010  ? DILATION AND CURETTAGE OF UTERUS  1994  ? LAPAROSCOPY N/A 11/21/2012  ? Procedure: LAPAROSCOPY OPERATIVE REMOVAL OF RIGHT TUBE AND OVARY AND FLUID FROM MASS;   Surgeon: Melina Schools, MD;  Location: [facility];  Service: Gynecology;  Laterality: N/A;  1 1/2hrs OR time  ? OOPHORECTOMY    ? ? ?FAMILY HISTORY: ?Family History  ?Problem Relation Age of Onset  ? Cancer -

## 2021-06-22 ENCOUNTER — Ambulatory Visit: Payer: Medicare Other | Admitting: Adult Health

## 2021-06-22 ENCOUNTER — Encounter: Payer: Self-pay | Admitting: Adult Health

## 2021-06-22 VITALS — BP 104/71 | HR 60 | Ht 68.0 in | Wt 220.8 lb

## 2021-06-22 DIAGNOSIS — G43009 Migraine without aura, not intractable, without status migrainosus: Secondary | ICD-10-CM

## 2021-06-22 NOTE — Patient Instructions (Addendum)
Your Plan: ? ?Consider Topamax or gabapentin ?Consider repeating MRI brain if you want ?If your symptoms worsen or you develop new symptoms please let us know.  ? ?Thank you for coming to see [place] at [facility]. I hope we have been able to provide you high quality care today. ? ?You may receive a patient satisfaction survey over the next few weeks. We would appreciate your feedback and comments so that we may continue to improve ourselves and the health of our patients. ? ?

## 2021-07-03 ENCOUNTER — Encounter: Payer: Self-pay | Admitting: Adult Health

## 2021-07-03 DIAGNOSIS — G43009 Migraine without aura, not intractable, without status migrainosus: Secondary | ICD-10-CM

## 2021-07-03 DIAGNOSIS — G4489 Other headache syndrome: Secondary | ICD-10-CM

## 2021-07-07 ENCOUNTER — Telehealth: Payer: Self-pay | Admitting: Adult Health

## 2021-07-07 NOTE — Telephone Encounter (Signed)
medicaid NPR sent to GI they will call the patient to schedule

## 2021-07-15 ENCOUNTER — Other Ambulatory Visit: Payer: Self-pay | Admitting: Family Medicine

## 2021-07-15 ENCOUNTER — Ambulatory Visit (INDEPENDENT_AMBULATORY_CARE_PROVIDER_SITE_OTHER): Payer: Medicare Other | Admitting: Orthopaedic Surgery

## 2021-07-15 DIAGNOSIS — M5416 Radiculopathy, lumbar region: Secondary | ICD-10-CM

## 2021-07-15 DIAGNOSIS — R52 Pain, unspecified: Secondary | ICD-10-CM | POA: Diagnosis not present

## 2021-07-15 NOTE — Telephone Encounter (Signed)
Patient has request refill on medication Cyclobenzaprine. Patient last refill dated 04/02/2021. Patient only had 30 tablets to take 3 times daily as needed for muscle spasms. I'm unsure if this medication is long term for patient. Patient medication pend and sent to PCP Ngetich, Nelda Bucks, NP for approval.

## 2021-07-15 NOTE — Progress Notes (Signed)
Office Visit Note   Patient: Brittney [name]           Date of Birth: [birth year]           MRN: [id number] Visit Date: 07/15/2021              Requested by: Sandrea Hughs, NP [address] [place],  [place] [address] PCP: Sandrea Hughs, NP   Assessment & Plan: Visit Diagnoses:  1. Total body pain     Plan: Impression is intermittent total body pain that feels like an electric shock.  This pain does not sound orthopedic related.  I have discussed with Dr. Erlinda Hong that it would be best if the patient return to see her rheumatologist.  She will follow-up with [place] as needed.  Follow-Up Instructions: Return if symptoms worsen or fail to improve.   Orders:  No orders of the defined types were placed in this encounter.  No orders of the defined types were placed in this encounter.     Procedures: No procedures performed   Clinical Data: No additional findings.   Subjective: Chief Complaint  Patient presents with   Lower Back - Pain    HPI patient is a pleasant 51 year old female who comes in today with intermittent pain that goes throughout the entire body.  She has been noticing this since September.  She notes that she has an electric shock sensation that goes throughout the entire body which occurs every day between 4 and 5 AM as well as when she stands up from sitting longer than 45 minutes.  She has an underlying history of low back pain with radiculopathy but this is improved after physical therapy.  She also has a history of rheumatoid arthritis for which she is treated by physician in [place].  She was initially put on methotrexate and Enbrel but has not been on medication since just prior to [place].  Review of Systems as detailed in HPI.  All others reviewed and are negative.   Objective: Vital Signs: There were no vitals taken for this visit.  Physical Exam well-developed well-nourished female no acute distress.  Alert and oriented x3.  Ortho Exam upper and lower  extremity exams are unremarkable and nonfocal  Specialty Comments:  No specialty comments available.  Imaging: No new imaging   PMFS History: Patient Active Problem List   Diagnosis Date Noted   Back pain with radiculopathy 01/07/2021   Hyperlipidemia    OA (osteoarthritis) of knee    GERD (gastroesophageal reflux disease)    Rheumatoid arthritis ([place])    Headache syndrome 12/12/2019   Bipolar 1 disorder, depressed, moderate ([place]) 12/14/2018   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   Fibromyalgia 02/03/2015   Tendinitis of both rotator cuffs 02/03/2015   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   Insomnia secondary to chronic pain 11/13/2014   Fatigue due to sleep pattern disturbance 11/13/2014   Memory difficulties 06/21/2014   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   PTSD (post-traumatic stress disorder) 12/13/2013   Bipolar depression ([place]) 12/12/2013   PERICARDIAL EFFUSION 12/23/2008   PSEUDOTUMOR CEREBRI 12/05/2008   UNSPECIFIED PLEURAL EFFUSION 12/05/2008   DYSPNEA 12/05/2008   OBESITY 11/19/2008   Migraine 11/19/2008   ASTHMA 11/19/2008   OTHER SPECIFIED DISORDER OF STOMACH AND DUODENUM 03/11/2008   Past Medical History:  Diagnosis Date   Abscess    to eyelid   Anxiety    Arthritis    KNEES ELBOWS HIPS SHOULDER  FINGERS   Asthma    Back pain    Bipolar disorder (H[facility])    Depression    BIPOLAR   Dyspnea    Fibromyalgia    GERD (gastroesophageal reflux disease)    Headache syndrome 12/12/2019   Headache(784.0)    MIGRAINES   History of colonoscopy    History of degenerative disc disease    Hyperlipidemia    Major depressive disorder    Memory difficulties 06/21/2014   Neck pain    OA (osteoarthritis) of knee    Obesity    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   Rheumatoid arthritis ([place])    Seizures ([place])    FACIAL SEIZURES LAST 4 DAYS AGO    Family History  Problem Relation Age of Onset   Cancer -  Lung Father    Diabetes Father    Cirrhosis Mother    Migraines Sister    Stroke Sister    Healthy Brother    Healthy Sister    Healthy Sister    Healthy Sister    Healthy Brother    Healthy Brother    Healthy Brother    Healthy Brother    Healthy Brother    Heart disease Maternal Grandmother    Alzheimer's disease Maternal Grandfather    Cancer - Lung Paternal Grandmother    Breast cancer Daughter 23    Past Surgical History:  Procedure Laterality Date   CARDIAC CATHETERIZATION  2010   DILATION AND CURETTAGE OF UTERUS  1994   LAPAROSCOPY N/A 11/21/2012   Procedure: LAPAROSCOPY OPERATIVE REMOVAL OF RIGHT TUBE AND OVARY AND FLUID FROM MASS;  Surgeon: Melina Schools, MD;  Location: [facility];  Service: Gynecology;  Laterality: N/A;  1 1/2hrs OR time   OOPHORECTOMY     Social History   Occupational History   Not on file  Tobacco Use   Smoking status: Former    Types: Cigarettes    Quit date: 02/16/2003    Years since quitting: 18.4   Smokeless tobacco: Never  Vaping Use   Vaping Use: Never used  Substance and Sexual Activity   Alcohol use: No    Alcohol/week: 0.0 standard drinks    Comment: occasionally   Drug use: No   Sexual activity: Not Currently

## 2021-07-20 ENCOUNTER — Other Ambulatory Visit: Payer: Medicare Other

## 2021-07-22 ENCOUNTER — Ambulatory Visit (HOSPITAL_COMMUNITY)
Admission: RE | Admit: 2021-07-22 | Discharge: 2021-07-22 | Disposition: A | Discharge status: Home / Self Care | Payer: Medicare Other | Source: Ambulatory Visit | Attending: Physician Assistant | Admitting: Physician Assistant

## 2021-07-22 ENCOUNTER — Encounter (HOSPITAL_COMMUNITY): Payer: Self-pay

## 2021-07-22 VITALS — BP 113/74 | HR 70 | Temp 99.4°F | Resp 16

## 2021-07-22 DIAGNOSIS — J029 Acute pharyngitis, unspecified: Secondary | ICD-10-CM | POA: Insufficient documentation

## 2021-07-22 DIAGNOSIS — R051 Acute cough: Secondary | ICD-10-CM | POA: Insufficient documentation

## 2021-07-22 DIAGNOSIS — Z20822 Contact with and (suspected) exposure to covid-19: Secondary | ICD-10-CM | POA: Diagnosis not present

## 2021-07-22 DIAGNOSIS — B349 Viral infection, unspecified: Secondary | ICD-10-CM | POA: Insufficient documentation

## 2021-07-22 DIAGNOSIS — R3911 Hesitancy of micturition: Secondary | ICD-10-CM | POA: Diagnosis not present

## 2021-07-22 DIAGNOSIS — K219 Gastro-esophageal reflux disease without esophagitis: Secondary | ICD-10-CM | POA: Insufficient documentation

## 2021-07-22 DIAGNOSIS — R509 Fever, unspecified: Secondary | ICD-10-CM | POA: Diagnosis present

## 2021-07-22 LAB — POC INFLUENZA A AND B ANTIGEN (URGENT CARE ONLY)
INFLUENZA A ANTIGEN, POC: NEGATIVE
INFLUENZA B ANTIGEN, POC: NEGATIVE

## 2021-07-22 LAB — POCT URINALYSIS DIPSTICK, ED / UC
Bilirubin Urine: NEGATIVE
Glucose, UA: NEGATIVE mg/dL
Ketones, ur: NEGATIVE mg/dL
Leukocytes,Ua: NEGATIVE
Nitrite: NEGATIVE
Protein, ur: NEGATIVE mg/dL
Specific Gravity, Urine: 1.015 (ref 1.005–1.030)
Urobilinogen, UA: 0.2 mg/dL (ref 0.0–1.0)
pH: 7 (ref 5.0–8.0)

## 2021-07-22 MED ORDER — LIDOCAINE VISCOUS HCL 2 % MT SOLN
15.0000 mL | Freq: Once | OROMUCOSAL | Status: AC
  Administered 2021-07-22: 15 mL via ORAL

## 2021-07-22 MED ORDER — ALUM & MAG HYDROXIDE-SIMETH 200-200-20 MG/5ML PO SUSP
ORAL | Status: AC
  Filled 2021-07-22: qty 30

## 2021-07-22 MED ORDER — LIDOCAINE VISCOUS HCL 2 % MT SOLN
OROMUCOSAL | Status: AC
  Filled 2021-07-22: qty 15

## 2021-07-22 MED ORDER — ALUM & MAG HYDROXIDE-SIMETH 200-200-20 MG/5ML PO SUSP
30.0000 mL | Freq: Once | ORAL | Status: AC
  Administered 2021-07-22: 30 mL via ORAL

## 2021-07-22 NOTE — ED Provider Notes (Signed)
[facility]    CSN: [id number] Arrival date & time: 07/22/21  1516      History   Chief Complaint Chief Complaint  Patient presents with   Fever    Exhaustion, winded - Entered by patient   Generalized Body Aches   urinary hesitancy     HPI ARABELLE BOLLIG is a 51 y.o. female.   Patient presents today with a several day history of urinary symptoms including frequency and urgency.  She denies any abdominal pain, pelvic pain, vaginal discharge, dysuria.  She does report an episode of nausea and vomiting when symptoms first occurred but has not had ongoing symptoms.  She reports developing a fever earlier today with Tmax of 100.4.  She has been taking Tylenol without improvement of symptoms.  She denies any recent antibiotic use.  She does report a mild sore throat as well as acid reflux symptoms.  She took Mylanta which improved this but did not resolve it.  She denies history of recurrent urinary tract infections.  Denies history of diabetes does not take SGLT2 inhibitor.  She reports widespread body aches as well as a headache.  Reports she is feeling very poorly.   Past Medical History:  Diagnosis Date   Abscess    to eyelid   Anxiety    Arthritis    KNEES ELBOWS HIPS SHOULDER FINGERS   Asthma    Back pain    Bipolar disorder (HCC)    Depression    BIPOLAR   Dyspnea    Fibromyalgia    GERD (gastroesophageal reflux disease)    Headache syndrome 12/12/2019   Headache(784.0)    MIGRAINES   History of colonoscopy    History of degenerative disc disease    Hyperlipidemia    Major depressive disorder    Memory difficulties 06/21/2014   Neck pain    OA (osteoarthritis) of knee    Obesity    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   Rheumatoid arthritis ([place])    Seizures ([place])    FACIAL SEIZURES LAST 4 DAYS AGO    Patient Active Problem List   Diagnosis Date Noted   Back pain with radiculopathy 01/07/2021   Hyperlipidemia    OA (osteoarthritis) of knee     GERD (gastroesophageal reflux disease)    Rheumatoid arthritis ([place])    Headache syndrome 12/12/2019   Bipolar 1 disorder, depressed, moderate ([place]) 12/14/2018   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   Fibromyalgia 02/03/2015   Tendinitis of both rotator cuffs 02/03/2015   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   Insomnia secondary to chronic pain 11/13/2014   Fatigue due to sleep pattern disturbance 11/13/2014   Memory difficulties 06/21/2014   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   PTSD (post-traumatic stress disorder) 12/13/2013   Bipolar depression ([place]) 12/12/2013   PERICARDIAL EFFUSION 12/23/2008   PSEUDOTUMOR CEREBRI 12/05/2008   UNSPECIFIED PLEURAL EFFUSION 12/05/2008   DYSPNEA 12/05/2008   OBESITY 11/19/2008   Migraine 11/19/2008   ASTHMA 11/19/2008   OTHER SPECIFIED DISORDER OF STOMACH AND DUODENUM 03/11/2008    Past Surgical History:  Procedure Laterality Date   CARDIAC CATHETERIZATION  2010   DILATION AND CURETTAGE OF UTERUS  1994   LAPAROSCOPY N/A 11/21/2012   Procedure: LAPAROSCOPY OPERATIVE REMOVAL OF RIGHT TUBE AND OVARY AND FLUID FROM MASS;  Surgeon: Melina Schools, MD;  Location: [facility];  Service: Gynecology;  Laterality: N/A;  1 1/2hrs OR time   OOPHORECTOMY  OB History   No obstetric history on file.      Home Medications    Prior to Admission medications   Medication Sig Start Date End Date Taking? Authorizing Provider  cyclobenzaprine (FLEXERIL) 10 MG tablet TAKE ONE TABLET BY MOUTH THREE TIMES DAILY AS NEEDED muscle SPASMS 07/15/21   Ngetich, Dinah C, NP  ibuprofen (ADVIL) 600 MG tablet Take 600 mg by mouth every 6 (six) hours as needed.    [provider]    Family History Family History  Problem Relation Age of Onset   Cancer - Lung Father    Diabetes Father    Cirrhosis Mother    Migraines Sister    Stroke Sister    Healthy Brother    Healthy Sister    Healthy Sister     Healthy Sister    Healthy Brother    Healthy Brother    Healthy Brother    Healthy Brother    Healthy Brother    Heart disease Maternal Grandmother    Alzheimer's disease Maternal Grandfather    Cancer - Lung Paternal Grandmother    Breast cancer Daughter 50    Social History Social History   Tobacco Use   Smoking status: Former    Types: Cigarettes    Quit date: 02/16/2003    Years since quitting: 18.4   Smokeless tobacco: Never  Vaping Use   Vaping Use: Never used  Substance Use Topics   Alcohol use: No    Alcohol/week: 0.0 standard drinks    Comment: occasionally   Drug use: No     Allergies   Carbamazepine and Nsaids   Review of Systems Review of Systems  Constitutional:  Positive for activity change, fatigue and fever. Negative for appetite change.  HENT:  Positive for sore throat. Negative for congestion.   Respiratory:  Negative for cough and shortness of breath.   Cardiovascular:  Negative for chest pain.  Gastrointestinal:  Positive for abdominal pain. Negative for diarrhea, nausea (Resolved) and vomiting (Resolved).  Genitourinary:  Positive for frequency and urgency. Negative for dysuria, pelvic pain, vaginal bleeding, vaginal discharge and vaginal pain.  Musculoskeletal:  Positive for arthralgias and myalgias.  Neurological:  Positive for headaches. Negative for dizziness and light-headedness.    Physical Exam Triage Vital Signs ED Triage Vitals  Enc Vitals Group     BP 07/22/21 1637 113/74     Pulse Rate 07/22/21 1637 70     Resp 07/22/21 1637 16     Temp 07/22/21 1637 99.4 F (37.4 C)     Temp Source 07/22/21 1637 Oral     SpO2 07/22/21 1637 100 %     Weight --      Height --      Head Circumference --      Peak Flow --      Pain Score 07/22/21 1635 8     Pain Loc --      Pain Edu? --      Excl. in [place]? --    No data found.  Updated Vital Signs BP 113/74 (BP Location: Right Arm)   Pulse 70   Temp 99.4 F (37.4 C) (Oral)   Resp 16    SpO2 100%   Visual Acuity Right Eye Distance:   Left Eye Distance:   Bilateral Distance:    Right Eye Near:   Left Eye Near:    Bilateral Near:     Physical Exam Vitals reviewed.  Constitutional:  General: She is awake. She is not in acute distress.    Appearance: Normal appearance. She is well-developed. She is not ill-appearing.     Comments: Very pleasant female appears stated age in no acute distress sitting comfortably in exam room  HENT:     Head: Normocephalic and atraumatic.     Mouth/Throat:     Pharynx: Uvula midline. Posterior oropharyngeal erythema present. No oropharyngeal exudate.     Tonsils: No tonsillar exudate or tonsillar abscesses. 2+ on the right. 2+ on the left.     Comments: Small tonsil stone noted right tonsil. Cardiovascular:     Rate and Rhythm: Normal rate and regular rhythm.     Heart sounds: Normal heart sounds, S1 normal and S2 normal. No murmur heard. Pulmonary:     Effort: Pulmonary effort is normal.     Breath sounds: Normal breath sounds. No wheezing, rhonchi or rales.     Comments: Clear to auscultation bilaterally Abdominal:     General: Bowel sounds are normal.     Palpations: Abdomen is soft.     Tenderness: There is no abdominal tenderness. There is no right CVA tenderness, left CVA tenderness, guarding or rebound.     Comments: Benign abdominal exam  Psychiatric:        Behavior: Behavior is cooperative.     UC Treatments / Results  Labs (all labs ordered are listed, but only abnormal results are displayed) Labs Reviewed  POCT URINALYSIS DIPSTICK, ED / UC - Abnormal; Notable for the following components:      Result Value   Hgb urine dipstick TRACE (*)    All other components within normal limits  SARS CORONAVIRUS 2 (TAT 6-24 HRS)  URINE CULTURE  POC INFLUENZA A AND B ANTIGEN (URGENT CARE ONLY)    EKG   Radiology No results found.  Procedures Procedures (including critical care time)  Medications Ordered in  UC Medications  alum & mag hydroxide-simeth (MAALOX/MYLANTA) 200-200-20 MG/5ML suspension 30 mL (30 mLs Oral Given 07/22/21 1731)    And  lidocaine (XYLOCAINE) 2 % viscous mouth solution 15 mL (15 mLs Oral Given 07/22/21 1732)    Initial Impression / Assessment and Plan / UC Course  I have reviewed the triage vital signs and the nursing notes.  Pertinent labs & imaging results that were available during my care of the patient were reviewed by me and considered in my medical decision making (see chart for details).     Urine was obtained that showed hemoglobin but is otherwise normal.  Given she has had some urinary symptoms as well as fever will send this off for culture but defer antibiotics until culture results are available.  Discussed that given she is also having URI symptoms it is likely that she has a viral illness causing her symptoms.  Flu testing was obtained and was negative.  COVID test is pending.  Discussed that sore throat is likely related to GI/GERD symptoms.  She was given GI cocktail with improvement of symptoms in clinic today.  Patient had to leave before we were able to complete visit and prescribe any medication.  Prior to her leaving we had discussed that we will contact her for COVID test is positive.  Final Clinical Impressions(s) / UC Diagnoses   Final diagnoses:  Viral illness  Fever, unspecified  Acute cough   Discharge Instructions   None    ED Prescriptions   None    PDMP not reviewed this encounter.   Gustave Lindeman,  Derry Skill, PA-C 07/22/21 1810

## 2021-07-22 NOTE — ED Triage Notes (Signed)
Pt reports fever since this morning, generalized body aches and urinary hesitancy x 2 days. States feels winded and exhausted. Tried tylenol for fever.

## 2021-07-23 LAB — URINE CULTURE: Culture: NO GROWTH

## 2021-07-23 LAB — SARS CORONAVIRUS 2 (TAT 6-24 HRS): SARS Coronavirus 2: NEGATIVE

## 2021-08-31 ENCOUNTER — Encounter: Payer: Self-pay | Admitting: Family

## 2021-08-31 ENCOUNTER — Ambulatory Visit (INDEPENDENT_AMBULATORY_CARE_PROVIDER_SITE_OTHER): Payer: Medicare Other | Admitting: Family

## 2021-08-31 VITALS — BP 132/74 | HR 87 | Temp 97.3°F | Resp 18 | Ht 68.0 in

## 2021-08-31 DIAGNOSIS — M05752 Rheumatoid arthritis with rheumatoid factor of left hip without organ or systems involvement: Secondary | ICD-10-CM

## 2021-08-31 DIAGNOSIS — R29898 Other symptoms and signs involving the musculoskeletal system: Secondary | ICD-10-CM

## 2021-08-31 DIAGNOSIS — M05751 Rheumatoid arthritis with rheumatoid factor of right hip without organ or systems involvement: Secondary | ICD-10-CM

## 2021-08-31 DIAGNOSIS — F3132 Bipolar disorder, current episode depressed, moderate: Secondary | ICD-10-CM

## 2021-08-31 DIAGNOSIS — M7918 Myalgia, other site: Secondary | ICD-10-CM

## 2021-08-31 NOTE — Assessment & Plan Note (Signed)
Suspect multifactorial from depression, rheumatoid arthritis and fibromyalgia.  Declined gabapentin, Lyrica and Cymbalta.  Also recommended pain management but declined.  Referral to [facility] neurology second opinion.

## 2021-08-31 NOTE — Assessment & Plan Note (Signed)
Currently off medication -Continue to follow-up with rheumatologist

## 2021-08-31 NOTE — Progress Notes (Signed)
Provider: Marlowe Sax FNP-C  Evangelene Vora, Nelda Bucks, NP  Patient Care Team: Toya Palacios, Nelda Bucks, NP as PCP - General (Family Medicine) Gevena Cotton, MD as Consulting Physician (Ophthalmology) Lorna Dibble, NP as Nurse Practitioner (Nurse Practitioner) Normajean Baxter, MD as Consulting Physician (Internal Medicine)  Extended Emergency Contact Information Primary Emergency Contact: Rickard Patience Mobile Phone: [phone] Relation: Son Secondary Emergency Contact[facility] Home Phone: [phone] Relation: Other  Code Status:  Full Code  Goals of care: Advanced Directive information    08/31/2021    3:31 PM  Advanced Directives  Does Patient Have a Medical Advance Directive? No  Would patient like information on creating a medical advance directive? No - Patient declined     Chief Complaint  Patient presents with   Acute Visit    Patient is here for generalized weakness and a follow up for a new plan of action for unresolved medical concerns    HPI:  Pt is a 51 y.o. female seen today for an acute visit for evaluation of generalized body pain.  Has a significant medical history of bipolar, major depression disorder, generalized anxiety disorder, migraine fibromyalgia, rheumatoid arthritis, osteoarthritis of knee, obesity currently off medication.  She is prescribed Cymbalta and Lyrica but did not take medication. Patient states was seen by neurologist stated was told to follow-up with the orthopedic does not think pain is related to anything that can be addressed by orthopedic.Neurologist recommended Topamax or gabapentin but wanted to wait to read side effects. MRI of the brain was ordered but patient states has not been called for the MRI.  She was advised to follow-up in 6 months or sooner if needed.  She requests another follow-up neurologist. Pain management referral recommended mild decline. State fell recently while getting out of the house scraped her  left knee.  Knee has healed. She denies any acute illnesses.   Past Medical History:  Diagnosis Date   Abscess    to eyelid   Anxiety    Arthritis    KNEES ELBOWS HIPS SHOULDER FINGERS   Asthma    Back pain    Bipolar disorder (HCC)    Depression    BIPOLAR   Dyspnea    Fibromyalgia    GERD (gastroesophageal reflux disease)    Headache syndrome 12/12/2019   Headache(784.0)    MIGRAINES   History of colonoscopy    History of degenerative disc disease    Hyperlipidemia    Major depressive disorder    Memory difficulties 06/21/2014   Neck pain    OA (osteoarthritis) of knee    Obesity    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   Rheumatoid arthritis ([place])    Seizures ([place])    FACIAL SEIZURES LAST 4 DAYS AGO   Past Surgical History:  Procedure Laterality Date   CARDIAC CATHETERIZATION  2010   DILATION AND CURETTAGE OF UTERUS  1994   LAPAROSCOPY N/A 11/21/2012   Procedure: LAPAROSCOPY OPERATIVE REMOVAL OF RIGHT TUBE AND OVARY AND FLUID FROM MASS;  Surgeon: Melina Schools, MD;  Location: [facility];  Service: Gynecology;  Laterality: N/A;  1 1/2hrs OR time   OOPHORECTOMY      Allergies  Allergen Reactions   Carbamazepine Rash   Nsaids Swelling     Pt says she can take ibuprofen    Outpatient Encounter Medications as of 08/31/2021  Medication Sig   cyclobenzaprine (FLEXERIL) 10 MG tablet TAKE ONE TABLET BY MOUTH THREE TIMES DAILY AS NEEDED muscle SPASMS  ibuprofen (ADVIL) 600 MG tablet Take 600 mg by mouth every 6 (six) hours as needed.   No facility-administered encounter medications on file as of 08/31/2021.    Review of Systems  Constitutional:  Negative for appetite change, chills, fatigue, fever and unexpected weight change.  HENT:  Negative for congestion, ear discharge, ear pain, hearing loss, nosebleeds, postnasal drip, rhinorrhea, sinus pressure, sinus pain, sneezing and sore throat.   Eyes:  Negative for pain, discharge, redness, itching and visual  disturbance.  Respiratory:  Negative for cough, chest tightness, shortness of breath and wheezing.   Cardiovascular:  Negative for chest pain, palpitations and leg swelling.  Gastrointestinal:  Negative for abdominal distention, abdominal pain, constipation, nausea and vomiting.  Endocrine: Negative for cold intolerance, heat intolerance, polydipsia, polyphagia and polyuria.  Genitourinary:  Negative for difficulty urinating, dysuria, flank pain, frequency and urgency.  Musculoskeletal:  Positive for arthralgias and myalgias. Negative for back pain, gait problem, joint swelling, neck pain and neck stiffness.  Skin:  Negative for color change, pallor, rash and wound.  Neurological:  Negative for dizziness, syncope, speech difficulty, weakness, light-headedness, numbness and headaches.  Hematological:  Does not bruise/bleed easily.  Psychiatric/Behavioral:  Negative for agitation, behavioral problems, confusion, hallucinations and sleep disturbance. The patient is not nervous/anxious.     Immunization History  Administered Date(s) Administered   Influenza Split 12/03/2010   Janssen (J&J) SARS-COV-2 Vaccination 05/24/2019   Pertinent  Health Maintenance Due  Topic Date Due   PAP SMEAR-Modifier  Never done   INFLUENZA VACCINE  09/15/2021   MAMMOGRAM  11/08/2022   COLONOSCOPY (Pts 45-19yr Insurance coverage will need to be confirmed)  01/25/2029      12/02/2020    3:24 PM 12/24/2020   10:43 AM 04/02/2021    6:40 PM 07/22/2021    4:37 PM 08/31/2021    3:31 PM  Fall Risk  Falls in the past year? 0 0   1  Was there an injury with Fall? 0 0   0  Fall Risk Category Calculator 0 0   1  Fall Risk Category Low Low   Low  Patient Fall Risk Level Low fall risk Low fall risk Low fall risk Low fall risk Low fall risk  Patient at Risk for Falls Due to No Fall Risks No Fall Risks   Impaired balance/gait  Fall risk Follow up Falls evaluation completed Falls evaluation completed   Falls evaluation  completed;Education provided;Falls prevention discussed   Functional Status Survey:    Vitals:   08/31/21 1533  BP: 132/74  Pulse: 87  Resp: 18  Temp: (!) 97.3 F (36.3 C)  SpO2: 97%  Height: '5\' 8"'$  (1.727 m)   Body mass index is 33.57 kg/m. Physical Exam Vitals reviewed.  Constitutional:      General: She is not in acute distress.    Appearance: Normal appearance. She is normal weight. She is not ill-appearing or diaphoretic.  HENT:     Head: Normocephalic.     Right Ear: Tympanic membrane, ear canal and external ear normal. There is no impacted cerumen.     Left Ear: Tympanic membrane, ear canal and external ear normal. There is no impacted cerumen.     Nose: Nose normal. No congestion or rhinorrhea.     Mouth/Throat:     Mouth: Mucous membranes are moist.     Pharynx: Oropharynx is clear. No oropharyngeal exudate or posterior oropharyngeal erythema.  Eyes:     General: No scleral icterus.  Right eye: No discharge.        Left eye: No discharge.     Extraocular Movements: Extraocular movements intact.     Conjunctiva/sclera: Conjunctivae normal.     Pupils: Pupils are equal, round, and reactive to light.  Neck:     Vascular: No carotid bruit.  Cardiovascular:     Rate and Rhythm: Normal rate and regular rhythm.     Pulses: Normal pulses.     Heart sounds: Normal heart sounds. No murmur heard.    No friction rub. No gallop.  Pulmonary:     Effort: Pulmonary effort is normal. No respiratory distress.     Breath sounds: Normal breath sounds. No wheezing, rhonchi or rales.  Chest:     Chest wall: No tenderness.  Abdominal:     General: Bowel sounds are normal. There is no distension.     Palpations: Abdomen is soft. There is no mass.     Tenderness: There is no abdominal tenderness. There is no right CVA tenderness, left CVA tenderness, guarding or rebound.  Musculoskeletal:        General: No swelling or tenderness. Normal range of motion.     Cervical back:  Normal range of motion. No rigidity or tenderness.     Right lower leg: No edema.     Left lower leg: No edema.  Lymphadenopathy:     Cervical: No cervical adenopathy.  Skin:    General: Skin is warm and dry.     Coloration: Skin is not pale.     Findings: No bruising, erythema, lesion or rash.     Comments: Small abrasion on right knee scabbed   Neurological:     Mental Status: She is alert and oriented to person, place, and time.     Cranial Nerves: No cranial nerve deficit.     Sensory: No sensory deficit.     Motor: No weakness.     Coordination: Coordination normal.     Gait: Gait normal.  Psychiatric:        Mood and Affect: Mood normal.        Speech: Speech normal.        Behavior: Behavior normal.        Thought Content: Thought content normal.        Judgment: Judgment normal.     Labs reviewed: No results for input(s): "NA", "K", "CL", "CO2", "GLUCOSE", "BUN", "CREATININE", "CALCIUM", "MG", "PHOS" in the last 8760 hours. No results for input(s): "AST", "ALT", "ALKPHOS", "BILITOT", "PROT", "ALBUMIN" in the last 8760 hours. No results for input(s): "WBC", "NEUTROABS", "HGB", "HCT", "MCV", "PLT" in the last 8760 hours. Lab Results  Component Value Date   TSH 0.500 12/14/2018   Lab Results  Component Value Date   HGBA1C 6.0 (H) 12/14/2018   Lab Results  Component Value Date   CHOL 200 12/14/2018   HDL 58 12/14/2018   LDLCALC 131 (H) 12/14/2018   TRIG 57 12/14/2018   CHOLHDL 3.4 12/14/2018    Significant Diagnostic Results in last 30 days:  No results found.  Assessment/Pla  Problem List Items Addressed This Visit       Musculoskeletal and Integument   Rheumatoid arthritis ([place])    Currently off medication -Continue to follow-up with rheumatologist        Other   Weakness of both legs - Primary    Reports fall while getting out of the house sustaining small abrasion to left knee which is scabbed  -Referral to  [place] neurology for second  opinion       Relevant Orders   Ambulatory referral to Neurology   Myalgia, multiple sites    Suspect multifactorial from depression, rheumatoid arthritis and fibromyalgia.  Declined gabapentin, Lyrica and Cymbalta.  Also recommended pain management but declined.  Referral to [facility] neurology second opinion.      Relevant Orders   Ambulatory referral to Neurology   Bipolar 1 disorder, depressed, moderate ([place])    - Currently not on any medication declines Cymbalta -Continue to follow-up with therapist and holistic medicine      Weeks  Family/ staff Communication: Reviewed plan of care with patient verbalized understanding  Labs/tests ordered: None   Next Appointment: Return if symptoms worsen or fail to improve.  All Sandrea Hughs, NP

## 2021-08-31 NOTE — Assessment & Plan Note (Signed)
Reports fall while getting out of the house sustaining small abrasion to left knee which is scabbed  -Referral to [facility] neurology for second opinion

## 2021-08-31 NOTE — Assessment & Plan Note (Signed)
-   Currently not on any medication declines Cymbalta -Continue to follow-up with therapist and holistic medicine

## 2021-10-21 ENCOUNTER — Telehealth: Payer: Self-pay

## 2021-10-21 NOTE — Telephone Encounter (Signed)
This patient was transferred over to the new patient line from the phone room to establish with a new MD after Dr Jannifer Franklin leaving. She was added to Dr Jaynee Eagles schedule as she has the soonest available appointment, which is November 17, 2021. The patient was not happy with that date and asked why she was being pushed out so far as she is an established patient. I advised her that that was the soonest available appointment we currently had and that I could add her to the cancellation list. She continually questioned why she was being scheduled so far out, I advised that I scheduled new patient appointments and that I was doing my best to get her scheduled for a follow up appointment, and that was the soonest availability with an MD I could find.  I advised that I could place her on the cancellation list, or schedule her for a sooner appointment with Ward Givens NP to bridge that gap between now and her appointment with the MD. She again expressed frustration with the "system we used" and that it does not make a patient feel like they are being cared for. Again, I advised her that I have her added to a cancellation list for all of the providers that can see her at the office, and that she was added as high priority, and that we could give her a call if anything opened up sooner.  She asked for a call back from a manager to discuss this concern.

## 2021-10-22 ENCOUNTER — Encounter (HOSPITAL_COMMUNITY): Payer: Self-pay

## 2021-10-22 ENCOUNTER — Ambulatory Visit (INDEPENDENT_AMBULATORY_CARE_PROVIDER_SITE_OTHER): Payer: Medicare Other

## 2021-10-22 ENCOUNTER — Ambulatory Visit (HOSPITAL_COMMUNITY)
Admission: RE | Admit: 2021-10-22 | Discharge: 2021-10-22 | Disposition: A | Discharge status: Home / Self Care | Payer: Medicare Other | Source: Ambulatory Visit | Attending: Family Medicine | Admitting: Family Medicine

## 2021-10-22 VITALS — BP 119/80 | HR 73 | Temp 98.8°F | Resp 16

## 2021-10-22 DIAGNOSIS — M545 Low back pain, unspecified: Secondary | ICD-10-CM | POA: Diagnosis present

## 2021-10-22 DIAGNOSIS — R202 Paresthesia of skin: Secondary | ICD-10-CM | POA: Diagnosis present

## 2021-10-22 DIAGNOSIS — R0600 Dyspnea, unspecified: Secondary | ICD-10-CM

## 2021-10-22 LAB — BASIC METABOLIC PANEL
Anion gap: 11 (ref 5–15)
BUN: 13 mg/dL (ref 6–20)
CO2: 24 mmol/L (ref 22–32)
Calcium: 9.6 mg/dL (ref 8.9–10.3)
Chloride: 105 mmol/L (ref 98–111)
Creatinine, Ser: 0.88 mg/dL (ref 0.44–1.00)
GFR, Estimated: >60 mL/min (ref >60)
Glucose, Bld: 84 mg/dL (ref 70–99)
Potassium: 4.5 mmol/L (ref 3.5–5.1)
Sodium: 140 mmol/L (ref 135–145)

## 2021-10-22 LAB — VITAMIN B12: Vitamin B-12: 1857 pg/mL — ABNORMAL HIGH (ref 180–914)

## 2021-10-22 LAB — CBC
HCT: 41.9 % (ref 36.0–46.0)
Hemoglobin: 13.4 g/dL (ref 12.0–15.0)
MCH: 27.5 pg (ref 26.0–34.0)
MCHC: 32 g/dL (ref 30.0–36.0)
MCV: 85.9 fL (ref 80.0–100.0)
Platelets: 314 10*3/uL (ref 150–400)
RBC: 4.88 MIL/uL (ref 3.87–5.11)
RDW: 12.9 % (ref 11.5–15.5)
WBC: 5 10*3/uL (ref 4.0–10.5)
nRBC: 0 % (ref 0.0–0.2)

## 2021-10-22 MED ORDER — KETOROLAC TROMETHAMINE 30 MG/ML IJ SOLN
INTRAMUSCULAR | Status: AC
  Filled 2021-10-22: qty 1

## 2021-10-22 MED ORDER — KETOROLAC TROMETHAMINE 30 MG/ML IJ SOLN: 30.0000 mg | Freq: Once | INTRAMUSCULAR | Status: DC

## 2021-10-22 MED ORDER — TRAMADOL HCL 50 MG PO TABS: 50.0000 mg | ORAL_TABLET | Freq: Four times a day (QID) | ORAL | 0 refills | Status: DC | PRN

## 2021-10-22 NOTE — ED Provider Notes (Signed)
[facility]    CSN: [id number] Arrival date & time: 10/22/21  1505      History   Chief Complaint Chief Complaint  Patient presents with   Back Pain    Numbness and tingling going down my extremities to my hands and feet. - Entered by patient   Numbness    HPI Brittney [name] is a 51 y.o. female.    Back Pain  Here for low back pain that is been bothering her in the last 2 days and she has numbness and tingling in her hands and in her feet.  No recent fever or chills or cold symptoms.  No recent trauma except she did fall on her concrete driveway about a month ago.  No dysuria and no hematuria.  She has tried Advil and Tylenol and they have not helped.  She does also have some ongoing left body pain that bothers her in the middle the night.  She is seeing neurology and will not be able to see them about this issue until October.  Also in the last months she has felt short of breath when moving around and even felt short of breath when she was doing yoga today.  Past Medical History:  Diagnosis Date   Abscess    to eyelid   Anxiety    Arthritis    KNEES ELBOWS HIPS SHOULDER FINGERS   Asthma    Back pain    Bipolar disorder (HCC)    Depression    BIPOLAR   Dyspnea    Fibromyalgia    GERD (gastroesophageal reflux disease)    Headache syndrome 12/12/2019   Headache(784.0)    MIGRAINES   History of colonoscopy    History of degenerative disc disease    Hyperlipidemia    Major depressive disorder    Memory difficulties 06/21/2014   Neck pain    OA (osteoarthritis) of knee    Obesity    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   Rheumatoid arthritis ([place])    Seizures ([place])    FACIAL SEIZURES LAST 4 DAYS AGO    Patient Active Problem List   Diagnosis Date Noted   Weakness of both legs 08/31/2021   Myalgia, multiple sites 08/31/2021   Back pain with radiculopathy 01/07/2021   Hyperlipidemia    OA (osteoarthritis) of knee    GERD (gastroesophageal  reflux disease)    Rheumatoid arthritis ([place])    Headache syndrome 12/12/2019   Bipolar 1 disorder, depressed, moderate ([place]) 12/14/2018   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   Fibromyalgia 02/03/2015   Tendinitis of both rotator cuffs 02/03/2015   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   Insomnia secondary to chronic pain 11/13/2014   Fatigue due to sleep pattern disturbance 11/13/2014   Memory difficulties 06/21/2014   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   PTSD (post-traumatic stress disorder) 12/13/2013   Bipolar depression ([place]) 12/12/2013   PERICARDIAL EFFUSION 12/23/2008   PSEUDOTUMOR CEREBRI 12/05/2008   UNSPECIFIED PLEURAL EFFUSION 12/05/2008   DYSPNEA 12/05/2008   OBESITY 11/19/2008   Migraine 11/19/2008   ASTHMA 11/19/2008   OTHER SPECIFIED DISORDER OF STOMACH AND DUODENUM 03/11/2008    Past Surgical History:  Procedure Laterality Date   CARDIAC CATHETERIZATION  2010   DILATION AND CURETTAGE OF UTERUS  1994   LAPAROSCOPY N/A 11/21/2012   Procedure: LAPAROSCOPY OPERATIVE REMOVAL OF RIGHT TUBE AND OVARY AND FLUID FROM MASS;  Surgeon: Melina Schools, MD;  Location: [facility];  Service:  Gynecology;  Laterality: N/A;  1 1/2hrs OR time   OOPHORECTOMY      OB History   No obstetric history on file.      Home Medications    Prior to Admission medications   Medication Sig Start Date End Date Taking? Authorizing Provider  traMADol (ULTRAM) 50 MG tablet Take 1 tablet (50 mg total) by mouth every 6 (six) hours as needed (pain). 10/22/21  Yes Alvin Rubano, Gwenlyn Perking, MD  cyclobenzaprine (FLEXERIL) 10 MG tablet TAKE ONE TABLET BY MOUTH THREE TIMES DAILY AS NEEDED muscle SPASMS 07/15/21   Ngetich, Nelda Bucks, NP    Family History Family History  Problem Relation Age of Onset   Cancer - Lung Father    Diabetes Father    Cirrhosis Mother    Migraines Sister    Stroke Sister    Healthy Brother    Healthy Sister    Healthy Sister     Healthy Sister    Healthy Brother    Healthy Brother    Healthy Brother    Healthy Brother    Healthy Brother    Heart disease Maternal Grandmother    Alzheimer's disease Maternal Grandfather    Cancer - Lung Paternal Grandmother    Breast cancer Daughter 22    Social History Social History   Tobacco Use   Smoking status: Former    Types: Cigarettes    Quit date: 02/16/2003    Years since quitting: 18.6   Smokeless tobacco: Never  Vaping Use   Vaping Use: Never used  Substance Use Topics   Alcohol use: No    Alcohol/week: 0.0 standard drinks of alcohol    Comment: occasionally   Drug use: No     Allergies   Carbamazepine and Nsaids   Review of Systems Review of Systems  Musculoskeletal:  Positive for back pain.     Physical Exam Triage Vital Signs ED Triage Vitals  Enc Vitals Group     BP 10/22/21 1532 119/80     Pulse Rate 10/22/21 1532 73     Resp 10/22/21 1532 16     Temp 10/22/21 1532 98.8 F (37.1 C)     Temp Source 10/22/21 1532 Oral     SpO2 10/22/21 1532 97 %     Weight --      Height --      Head Circumference --      Peak Flow --      Pain Score 10/22/21 1530 9     Pain Loc --      Pain Edu? --      Excl. in [place]? --    No data found.  Updated Vital Signs BP 119/80 (BP Location: Left Arm)   Pulse 73   Temp 98.8 F (37.1 C) (Oral)   Resp 16   SpO2 97%   Visual Acuity Right Eye Distance:   Left Eye Distance:   Bilateral Distance:    Right Eye Near:   Left Eye Near:    Bilateral Near:     Physical Exam Vitals reviewed.  Constitutional:      General: She is not in acute distress.    Appearance: She is not ill-appearing, toxic-appearing or diaphoretic.  HENT:     Mouth/Throat:     Mouth: Mucous membranes are moist.  Eyes:     Extraocular Movements: Extraocular movements intact.     Conjunctiva/sclera: Conjunctivae normal.     Pupils: Pupils are equal, round, and reactive to light.  Cardiovascular:     Rate and Rhythm:  Normal rate and regular rhythm.     Heart sounds: No murmur heard. Pulmonary:     Effort: Pulmonary effort is normal.     Breath sounds: Normal breath sounds. No stridor. No wheezing, rhonchi or rales.  Musculoskeletal:        General: No swelling or tenderness.     Cervical back: Neck supple.  Lymphadenopathy:     Cervical: No cervical adenopathy.  Skin:    Coloration: Skin is not jaundiced or pale.     Findings: No rash.  Neurological:     General: No focal deficit present.     Mental Status: She is alert and oriented to person, place, and time.  Psychiatric:        Behavior: Behavior normal.      UC Treatments / Results  Labs (all labs ordered are listed, but only abnormal results are displayed) Labs Reviewed  CBC  BASIC METABOLIC PANEL  VITAMIN K53    EKG   Radiology DG Chest 2 View  Result Date: 10/22/2021 CLINICAL DATA:  Dyspnea on exertion and low back pain. EXAM: CHEST - 2 VIEW COMPARISON:  Chest radiograph 01/31/2020 FINDINGS: Cardiomediastinal silhouette is normal. There is no focal consolidation or pulmonary edema. There is no pleural effusion or pneumothorax There is no acute osseous abnormality. IMPRESSION: No radiographic evidence of acute cardiopulmonary process. Electronically Signed   By: Valetta Mole M.D.   On: 10/22/2021 16:32   DG Lumbar Spine 2-3 Views  Result Date: 10/22/2021 CLINICAL DATA:  Low back pain. EXAM: LUMBAR SPINE - 2-3 VIEW COMPARISON:  04/02/2021 FINDINGS: Five non-rib-bearing lumbar vertebra. Trace anterolisthesis of L3 on L4. Otherwise normal alignment. Vertebral body heights are normal. Moderate L3-L4 and mild L4-L5 facet hypertrophy. Disc spaces are preserved. No fracture or evidence of focal bone abnormality. Sacroiliac joints are symmetric and normal. IMPRESSION: Facet hypertrophy at L3-L4 and L4-L5 with trace anterolisthesis of L3 on L4. Electronically Signed   By: Keith Rake M.D.   On: 10/22/2021 16:25    Procedures Procedures  (including critical care time)  Medications Ordered in UC Medications  ketorolac (TORADOL) 30 MG/ML injection 30 mg (has no administration in time range)    Initial Impression / Assessment and Plan / UC Course  I have reviewed the triage vital signs and the nursing notes.  Pertinent labs & imaging results that were available during my care of the patient were reviewed by me and considered in my medical decision making (see chart for details).        Chest x-ray is clear.  L-spine films do not reveal any bony abnormality.  She is given a shot of Toradol here and other pain relief for home.  NSAIDs give her a lot of reflux, so a small amount of tramadol is sent for her to take as needed for pain.  CBC and B12 are drawn today to evaluate her symptoms further Final Clinical Impressions(s) / UC Diagnoses   Final diagnoses:  Acute bilateral low back pain without sciatica  Paresthesias     Discharge Instructions      Your chest x-ray was clear and did not show pneumonia or fluid X-ray of your low back did not show any bony problem  You have been given a shot of Toradol 30 mg today.  Take tramadol 50 mg-- 1 tablet every 6 hours as needed for pain.  This medication can make you sleepy or dizzy  We have  drawn blood to check a blood count, a B12 level, and sugar and electrolytes.  Our staff will call you if there is anything significantly abnormal  Please follow-up with your primary care also about this issue     ED Prescriptions     Medication Sig Dispense Auth. Provider   traMADol (ULTRAM) 50 MG tablet Take 1 tablet (50 mg total) by mouth every 6 (six) hours as needed (pain). 12 tablet Bryce Kimble, Gwenlyn Perking, MD      I have reviewed the PDMP during this encounter.   Barrett Henle, MD 10/22/21 2022

## 2021-10-22 NOTE — ED Triage Notes (Signed)
Pt reports for a couple days having lower back pains and numbness in all extremities.medications that has is not helping with numbness and tingling pains.   Had issues with all over body problems and seen a neurologist for it before.

## 2021-10-22 NOTE — Discharge Instructions (Addendum)
Your chest x-ray was clear and did not show pneumonia or fluid X-ray of your low back did not show any bony problem  You have been given a shot of Toradol 30 mg today.  Take tramadol 50 mg-- 1 tablet every 6 hours as needed for pain.  This medication can make you sleepy or dizzy  We have drawn blood to check a blood count, a B12 level, and sugar and electrolytes.  Our staff will call you if there is anything significantly abnormal  Please follow-up with your primary care also about this issue

## 2021-10-23 ENCOUNTER — Ambulatory Visit (HOSPITAL_COMMUNITY): Payer: Medicare Other

## 2021-10-26 ENCOUNTER — Other Ambulatory Visit: Payer: Self-pay | Admitting: Obstetrics and Gynecology

## 2021-10-26 DIAGNOSIS — Z1231 Encounter for screening mammogram for malignant neoplasm of breast: Secondary | ICD-10-CM

## 2021-11-10 ENCOUNTER — Ambulatory Visit
Admission: RE | Admit: 2021-11-10 | Discharge: 2021-11-10 | Disposition: A | Discharge status: Home / Self Care | Payer: Medicare Other | Source: Ambulatory Visit | Attending: Obstetrics and Gynecology | Admitting: Obstetrics and Gynecology

## 2021-11-10 DIAGNOSIS — Z1231 Encounter for screening mammogram for malignant neoplasm of breast: Secondary | ICD-10-CM

## 2021-11-17 ENCOUNTER — Encounter: Payer: Self-pay | Admitting: Neurology

## 2021-11-17 ENCOUNTER — Ambulatory Visit (INDEPENDENT_AMBULATORY_CARE_PROVIDER_SITE_OTHER): Payer: Medicare Other | Admitting: Neurology

## 2021-11-17 VITALS — BP 103/71 | HR 57 | Ht 68.0 in | Wt 219.0 lb

## 2021-11-17 DIAGNOSIS — R200 Anesthesia of skin: Secondary | ICD-10-CM | POA: Diagnosis not present

## 2021-11-17 DIAGNOSIS — R29818 Other symptoms and signs involving the nervous system: Secondary | ICD-10-CM | POA: Diagnosis not present

## 2021-11-17 DIAGNOSIS — R531 Weakness: Secondary | ICD-10-CM | POA: Diagnosis not present

## 2021-11-17 NOTE — Patient Instructions (Signed)
Mri brain and cervical spine

## 2021-11-17 NOTE — Progress Notes (Signed)
PATIENT: Brittney [name] DOB: 10/22/1970  REASON FOR VISIT: follow up HISTORY FROM: patient PRIMARY NEUROLOGIST: Dr. Jaynee Eagles  Chief Complaint  Patient presents with   New Patient (Initial Visit)    Pt in 18 Pt states she has nerve pain all over her body     11/17/2021: Patient is transitioning to me from Dr. Jannifer Franklin who retired. MRI of the brain was ordered 07/06/2021 but was not completed by patient. She states she has "nerve pain all over her body" wakes her up every morning, electric shocks in all extremities and all over her head, no dermatomal pattern, in every spot of her skin when she wakes up. Lasts briefly and then goes away. No weakness, no vision changes, no changes in gait or other focal weakness. Wakes her up at 4am every day. No facial droop just whole body radiating and stabbing and extremely painful. She can have the pain during the day off and on a tingling sharpness right now in both her arms and thighs, symmetrical. B12 1857 3 weeks ago. BMP, cbc also normal. Also right-sided weakness and decreased sensation in the right limbs and right face.  Patient complains of symptoms per HPI as well as the following symptoms: paresthesias and weakness . Pertinent negatives and positives per HPI. All others negative     HISTORY OF PRESENT ILLNESS:Dr. Jannifer Franklin and Ward Givens NP Today 11/17/21:  [name] is a 51 year old female  Headaches started back about 1 month ago. Usually starts with mild dizziness. Has events 2-3 times a week. Pain 8/10. Location varies. Photophobia and phonophobia. Occasionally has nausea typically no vomiting. No numbness and tingling in the extremities.   MRI WO contrast in 2016:IMPRESSION:  This MRI of the brain without contrast shows a mildly enlarged sella turcica with thinned pituitary tissue. Although nonspecific this is commonly seen with pseudotumor cerebri. There are no acute findings.  LP showed opening pressure 21  Dr. Jannifer Franklin did not think it  was consistent with pseudotumor cerebri.   Reports that she is having all over body pain. Saw orthopedist.  In the past was told she was told she had fibromyalgia. Not currrently being treated for that. Unable to tolerate lyrica.   Patient feels that her migraines and is left-sided body pain may be interrelated.  I have not seen notes from orthopedist.   HISTORY [name] is a 51 year old right-handed black female with a history of fibromyalgia and rheumatoid arthritis.  The patient was seen through this office in 2016.  She returns today with very similar complaints.  The patient has a history of what was felt to be pseudotumor cerebri, but a work-up in 2016 indicated that she did not have this clinical syndrome.  She has had headaches off and on for many years, over the last 2 months her headaches have been daily in nature.  The headaches are all over the head and may be associated with some blurring of vision in the occasional nausea without vomiting.  She indicates that the headaches are usually better in the morning and worse as the day goes on.  She reports a chronic issue with cognitive dysfunction, she was complaining of some memory problems back in 2016.  A Mini-Mental Status Examination at that time was 30/30.  The patient apparently contracted the Covid virus in April 2021.  She has come off of all of her medications since that time.  She was on methotrexate and Enbrel for her rheumatoid arthritis, but she  has not taken these medications in over 6 months.  The patient does try to stay active, she engages in yoga and water aerobics.  If she overdoes, she will have a "fibroflare".  She does have some joint pains in the hips and knees associated with a rheumatoid arthritis.  The patient reports some numbness and tingling in the fingers, she feels that the hands are weak.  She does have some balance issues, she may stumble or fall on occasion.  She denies issues controlling the bowels or the bladder.   She did have the diagnosis of irritable bowel syndrome but this improved with an alteration in her diet.  The patient was given Cymbalta and Lyrica for the fibromyalgia symptoms through her primary doctor, but she never took these medications.  The patient is not clear that she wants to go on any medications at this time.  She reports that she may lose her train of thought at times when she is conversing with someone.  The patient is still working, she is able to manage her day-to-day affairs fairly well.  She comes to the office today for an evaluation.  [name] is a 51 year old right-handed black female with a history of fibromyalgia and rheumatoid arthritis.  The patient was seen through this office in 2016.  She returns today with very similar complaints.  The patient has a history of what was felt to be pseudotumor cerebri, but a work-up in 2016 indicated that she did not have this clinical syndrome.  She has had headaches off and on for many years, over the last 2 months her headaches have been daily in nature.  The headaches are all over the head and may be associated with some blurring of vision in the occasional nausea without vomiting.  She indicates that the headaches are usually better in the morning and worse as the day goes on.  She reports a chronic issue with cognitive dysfunction, she was complaining of some memory problems back in 2016.  A Mini-Mental Status Examination at that time was 30/30.  The patient apparently contracted the Covid virus in April 2021.  She has come off of all of her medications since that time.  She was on methotrexate and Enbrel for her rheumatoid arthritis, but she has not taken these medications in over 6 months.  The patient does try to stay active, she engages in yoga and water aerobics.  If she overdoes, she will have a "fibroflare".  She does have some joint pains in the hips and knees associated with a rheumatoid arthritis.  The patient reports some numbness and  tingling in the fingers, she feels that the hands are weak.  She does have some balance issues, she may stumble or fall on occasion.  She denies issues controlling the bowels or the bladder.  She did have the diagnosis of irritable bowel syndrome but this improved with an alteration in her diet.  The patient was given Cymbalta and Lyrica for the fibromyalgia symptoms through her primary doctor, but she never took these medications.  The patient is not clear that she wants to go on any medications at this time.  She reports that she may lose her train of thought at times when she is conversing with someone.  The patient is still working, she is able to manage her day-to-day affairs fairly well.  She comes to the office today for an evaluation.    REVIEW OF SYSTEMS: Out of a complete 14 system review of  symptoms, the patient complains only of the following symptoms, and all other reviewed systems are negative.  ALLERGIES: Allergies  Allergen Reactions   Carbamazepine Rash   Nsaids Swelling     Pt says she can take ibuprofen    HOME MEDICATIONS: Outpatient Medications Prior to Visit  Medication Sig Dispense Refill   MAGNESIUM PO Take by mouth.     cyclobenzaprine (FLEXERIL) 10 MG tablet TAKE ONE TABLET BY MOUTH THREE TIMES DAILY AS NEEDED muscle SPASMS (Patient taking differently: as needed.) 30 tablet 1   traMADol (ULTRAM) 50 MG tablet Take 1 tablet (50 mg total) by mouth every 6 (six) hours as needed (pain). (Patient taking differently: Take 50 mg by mouth as needed (pain).) 12 tablet 0   No facility-administered medications prior to visit.    PAST MEDICAL HISTORY: Past Medical History:  Diagnosis Date   Abscess    to eyelid   Anxiety    Arthritis    KNEES ELBOWS HIPS SHOULDER FINGERS   Asthma    Back pain    Bipolar disorder (HCC)    Depression    BIPOLAR   Dyspnea    Fibromyalgia    GERD (gastroesophageal reflux disease)    Headache syndrome 12/12/2019   Headache(784.0)     MIGRAINES   History of colonoscopy    History of degenerative disc disease    Hyperlipidemia    Major depressive disorder    Memory difficulties 06/21/2014   Neck pain    OA (osteoarthritis) of knee    Obesity    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   Rheumatoid arthritis ([place])    Seizures ([place])    FACIAL SEIZURES LAST 4 DAYS AGO    PAST SURGICAL HISTORY: Past Surgical History:  Procedure Laterality Date   CARDIAC CATHETERIZATION  2010   DILATION AND CURETTAGE OF UTERUS  1994   LAPAROSCOPY N/A 11/21/2012   Procedure: LAPAROSCOPY OPERATIVE REMOVAL OF RIGHT TUBE AND OVARY AND FLUID FROM MASS;  Surgeon: Melina Schools, MD;  Location: [facility];  Service: Gynecology;  Laterality: N/A;  1 1/2hrs OR time   OOPHORECTOMY      FAMILY HISTORY: Family History  Problem Relation Age of Onset   Cirrhosis Mother    Cancer - Lung Father    Diabetes Father    Neuropathy Father    Migraines Sister    Stroke Sister    Healthy Sister    Healthy Sister    Healthy Sister    Healthy Brother    Healthy Brother    Healthy Brother    Healthy Brother    Healthy Brother    Healthy Brother    Heart disease Maternal Grandmother    Alzheimer's disease Maternal Grandfather    Cancer - Lung Paternal Grandmother    Breast cancer Daughter 21   Neuropathy Daughter     SOCIAL HISTORY: Social History   Socioeconomic History   Marital status: Divorced    Spouse name: Not on file   Number of children: 5   Years of education: BA   Highest education level: Not on file  Occupational History   Not on file  Tobacco Use   Smoking status: Former    Types: Cigarettes    Quit date: 02/16/2003    Years since quitting: 18.7   Smokeless tobacco: Never  Vaping Use   Vaping Use: Never used  Substance and Sexual Activity   Alcohol use: No    Alcohol/week: 0.0 standard drinks of alcohol  Comment: occasionally   Drug use: No   Sexual activity: Not Currently  Other Topics Concern   Not on file   Social History Narrative   Tobacco use, amount per day now: N/A   Past tobacco use, amount per day: 1/2 packs per day    How many years did you use tobacco: 20 years   Alcohol use (drinks per week): N/A   Diet: Anti    Do you drink/eat things with caffeine: 1 cup of coffee daily.   Marital status: Divorced                                 What year were you married? 1993 & 2004   Do you live in a house, apartment, assisted living, condo, trailer, etc.? House   Is it one or more stories? 1.5   How many persons live in your home? 2   Do you have pets in your home?( please list) 1 Dog.   Highest Level of education completed? Some graduate, Ambulance person.   Current or past profession: Development worker, international aid, Scientist, physiological, Art gallery manager.    Do you exercise?                                  Type and how often?   Do you have a living will?   Yes   Do you have a DNR form?  Yes                              If not, do you want to discuss one?   Do you have signed POA/HPOA forms?  Yes                      If so, please bring to you appointment      Do you have any difficulty bathing or dressing yourself? Yes   Do you have any difficulty preparing food or eating? Yes   Do you have any difficulty managing your medications? No   Do you have any difficulty managing your finances? Yes   Do you have any difficulty affording your medications? No   Social Determinants of Radio broadcast assistant Strain: Not on file  Food Insecurity: Not on file  Transportation Needs: Not on file  Physical Activity: Not on file  Stress: Not on file  Social Connections: Not on file  Intimate Partner Violence: Not on file      PHYSICAL EXAM  Vitals:   11/17/21 0909  BP: 103/71  Pulse: (!) 57  Weight: 219 lb (99.3 kg)  Height: '5\' 8"'$  (1.727 m)    Body mass index is 33.3 kg/m.  Generalized: Well developed, in no acute distress   Neurological examination  Mentation: Alert oriented to time, place, history  taking. Follows all commands speech and language fluent Cranial nerve II-XII: Pupils were equal round reactive to light. Extraocular movements were full, visual field were full on confrontational test. Facial sensation and strength were normal. Uvula tongue midline. Head turning and shoulder shrug  were normal and symmetric. Motor: The motor testing reveals 5 over 5 strength of all 4 extremities. Good symmetric motor tone is noted throughout.  Sensory: Sensory testing is intact to soft touch on all 4 extremities. No evidence of extinction is noted.  Coordination: Cerebellar  testing reveals good finger-nose-finger and heel-to-shin bilaterally.  Gait and station: Gait is normal. Tandem gait is normal. Romberg is negative. No drift is seen.  Reflexes: Deep tendon reflexes are symmetric and normal bilaterally.   DIAGNOSTIC DATA (LABS, IMAGING, TESTING) - I reviewed patient records, labs, notes, testing and imaging myself where available.  Lab Results  Component Value Date   WBC 5.0 10/22/2021   HGB 13.4 10/22/2021   HCT 41.9 10/22/2021   MCV 85.9 10/22/2021   PLT 314 10/22/2021      Component Value Date/Time   NA 140 10/22/2021 1640   K 4.5 10/22/2021 1640   CL 105 10/22/2021 1640   CO2 24 10/22/2021 1640   GLUCOSE 84 10/22/2021 1640   BUN 13 10/22/2021 1640   CREATININE 0.88 10/22/2021 1640   CALCIUM 9.6 10/22/2021 1640   PROT 8.0 01/31/2020 1704   ALBUMIN 4.5 01/31/2020 1704   AST 23 01/31/2020 1704   ALT 20 01/31/2020 1704   ALKPHOS 91 01/31/2020 1704   BILITOT 0.7 01/31/2020 1704   GFRNONAA >60 10/22/2021 1640   GFRAA >60 12/14/2018 1833   Lab Results  Component Value Date   CHOL 200 12/14/2018   HDL 58 12/14/2018   LDLCALC 131 (H) 12/14/2018   TRIG 57 12/14/2018   CHOLHDL 3.4 12/14/2018   Lab Results  Component Value Date   HGBA1C 6.0 (H) 12/14/2018   Lab Results  Component Value Date   VITAMINB12 1,857 (H) 10/22/2021   Lab Results  Component Value Date   TSH  0.500 12/14/2018    Exam: NAD, pleasant                  Speech:    Speech is normal; fluent and spontaneous with normal comprehension.  Cognition:    The patient is oriented to person, place, and time;     recent and remote memory intact;     language fluent;    Cranial Nerves:    The pupils are equal, round, and reactive to light.decreased right side splits midline The face is symmetric. The palate elevates in the midline. Hearing intact. Voice is normal. Shoulder shrug is normal. The tongue has normal motion without fasciculations.   Coordination:  No dysmetria  Motor Observation:    No asymmetry, no atrophy, and no involuntary movements noted. Tone:    Normal muscle tone.     Strength:    Mild diffuse weakness right side      Sensation: decreased to sensation right arm and leg  States she feels shooting pain with head flexion  Reflexes are normal, toes downgoing, no clonus  Gait normal   ASSESSMENT AND PLAN 51 y.o. year old female  has a past medical history of Abscess, Anxiety, Arthritis, Asthma, Back pain, Bipolar disorder ([place]), Depression, Dyspnea, Fibromyalgia, GERD (gastroesophageal reflux disease), Headache syndrome (12/12/2019), Headache(784.0), History of colonoscopy, History of degenerative disc disease, Hyperlipidemia, Major depressive disorder, Memory difficulties (06/21/2014), Neck pain, OA (osteoarthritis) of knee, Obesity, Pseudotumor cerebri syndrome (05/30/2014), Rheumatoid arthritis ([place]), and Seizures ([place]). here with:  Patient with right-sided weakness and hemisensory loss and body paresthesias in the morning, States she feels shooting pain with head flexion, we will get an MRi of the brain and cervical spine to look for strokes, myelopathy, spinal cord lesion, multiple sclerosis or other. Demylelinating disease could cause this. Needs evaluation  Dr. Jannifer Franklin and Ward Givens prior appointments: I had a long discussion with the patient.  I am not sure  what is  causing her left-sided pain as I  have not seen orthopedics notes to see what they are thinking.  Advised that I could try to help her with headaches.  Advised that we could retry Topamax or if she wanted to try gabapentin that may give her some benefit with the left-sided pain as well.  Patient would like to read over these medications and then will let me know if she wants to start one.  We also discussed t repeating MRI of the brain-- patient will also let me know if she wants to proceed. FU in 6 months or sooner if needed.   Orders Placed This Encounter  Procedures   MR BRAIN W WO CONTRAST   MR CERVICAL SPINE W WO CONTRAST    Dexton Zwilling md  11/17/2021, 9:51 AM [facility] [address], [place], [place] [address] [phone]  I spent over 30 minutes of face-to-face and non-face-to-face time with patient on the  1. Hemisensory loss   2. Right sided weakness   3. Lhermitte's sign positive?    diagnosis.  This included previsit chart review, lab review, study review, order entry, electronic health record documentation, patient education on the different diagnostic and therapeutic options, counseling and coordination of care, risks and benefits of management, compliance, or risk factor reduction

## 2021-12-02 ENCOUNTER — Telehealth: Payer: Self-pay | Admitting: Neurology

## 2021-12-02 NOTE — Telephone Encounter (Signed)
no auth required sent to [facility]

## 2021-12-07 ENCOUNTER — Ambulatory Visit (HOSPITAL_COMMUNITY): Payer: Medicare Other

## 2021-12-07 ENCOUNTER — Ambulatory Visit (HOSPITAL_COMMUNITY): Admission: RE | Admit: 2021-12-07 | Payer: Medicare Other | Source: Ambulatory Visit

## 2021-12-29 ENCOUNTER — Ambulatory Visit (HOSPITAL_COMMUNITY): Admission: RE | Admit: 2021-12-29 | Payer: Medicare Other | Source: Ambulatory Visit

## 2021-12-29 ENCOUNTER — Ambulatory Visit (HOSPITAL_COMMUNITY): Payer: Medicare Other

## 2021-12-30 ENCOUNTER — Ambulatory Visit (HOSPITAL_COMMUNITY): Payer: Medicare Other

## 2021-12-31 ENCOUNTER — Encounter (HOSPITAL_COMMUNITY): Payer: Self-pay

## 2021-12-31 ENCOUNTER — Ambulatory Visit: Payer: Medicare Other | Admitting: Adult Health

## 2021-12-31 ENCOUNTER — Ambulatory Visit (HOSPITAL_COMMUNITY)
Admission: RE | Admit: 2021-12-31 | Discharge: 2021-12-31 | Disposition: A | Discharge status: Home / Self Care | Payer: Medicare Other | Source: Ambulatory Visit | Attending: Emergency Medicine | Admitting: Emergency Medicine

## 2021-12-31 ENCOUNTER — Ambulatory Visit (HOSPITAL_COMMUNITY): Payer: Medicare Other

## 2021-12-31 VITALS — BP 111/70 | HR 70 | Temp 98.8°F | Resp 16

## 2021-12-31 DIAGNOSIS — Z789 Other specified health status: Secondary | ICD-10-CM

## 2021-12-31 DIAGNOSIS — J22 Unspecified acute lower respiratory infection: Secondary | ICD-10-CM | POA: Diagnosis not present

## 2021-12-31 DIAGNOSIS — U071 COVID-19: Secondary | ICD-10-CM | POA: Diagnosis not present

## 2021-12-31 DIAGNOSIS — B9689 Other specified bacterial agents as the cause of diseases classified elsewhere: Secondary | ICD-10-CM | POA: Diagnosis not present

## 2021-12-31 DIAGNOSIS — B349 Viral infection, unspecified: Secondary | ICD-10-CM

## 2021-12-31 LAB — POCT RAPID STREP A, ED / UC: Streptococcus, Group A Screen (Direct): NEGATIVE

## 2021-12-31 LAB — RESP PANEL BY RT-PCR (FLU A&B, COVID) ARPGX2
Influenza A by PCR: NEGATIVE
Influenza B by PCR: NEGATIVE
SARS Coronavirus 2 by RT PCR: POSITIVE — AB

## 2021-12-31 LAB — POC INFLUENZA A AND B ANTIGEN (URGENT CARE ONLY)
INFLUENZA A ANTIGEN, POC: NEGATIVE
INFLUENZA B ANTIGEN, POC: NEGATIVE

## 2021-12-31 MED ORDER — CETIRIZINE HCL 10 MG PO TABS: 10.0000 mg | ORAL_TABLET | Freq: Every day | ORAL | 1 refills | Status: DC

## 2021-12-31 MED ORDER — FLUTICASONE PROPIONATE 50 MCG/ACT NA SUSP: 1.0000 | Freq: Every day | NASAL | 2 refills | Status: DC

## 2021-12-31 MED ORDER — PROMETHAZINE-DM 6.25-15 MG/5ML PO SYRP
5.0000 mL | ORAL_SOLUTION | Freq: Four times a day (QID) | ORAL | 0 refills | Status: DC | PRN
Start: 2021-12-31 — End: 2022-08-23

## 2021-12-31 MED ORDER — AZITHROMYCIN 250 MG PO TABS
ORAL_TABLET | ORAL | 0 refills | Status: AC
Start: 2021-12-31 — End: 2022-01-04

## 2021-12-31 MED ORDER — GUAIFENESIN 400 MG PO TABS: ORAL_TABLET | ORAL | 0 refills | Status: DC

## 2021-12-31 NOTE — Discharge Instructions (Addendum)
Your strep test today is negative.     Your rapid influenza antigen test today was negative.  We will perform a PCR influenza test today to confirm.  Per our protocol here at urgent care, this test must be ordered at the same time with COVID-19 PCR testing.   The results of your PCR testing will be posted to your MyChart once it is complete.  This typically takes 6 to 12 hours.    If your influenza PCR test is positive, you will be contacted by phone.  Please complete full 5-day course of Tamiflu, a prescription will be provided for you.     If your COVID-19 PCR test is positive, you will be contacted by phone.  Because you do not have a history of being immune compromised, you are currently vaccinated for COVID-19, you are under the age of 76, you do not have a risk of severe disease due to COVID-19, antiviral treatment is not indicated.    If your COVID-19 PCR test is negative, please consider retesting in the next 2 to 3 days, particularly if you are not feeling any better.  You are welcome to return here to urgent care to have it done or you can take a home COVID-19 test.   If both your COVID-19 tests are negative and your influenza test is negative, then you can safely assume that your illness is due to one of the many less serious illnesses circulating in our community right now.  Conservative care is recommended with rest, drinking plenty of clear fluids, eating only when hungry, taking supportive medications for your symptoms and avoiding being around other people.  Please remain at home until you are fever free for 24 hours without the use of antifever medications such as Tylenol and ibuprofen.    Please read below to learn more about the medications, dosages and frequencies that I recommend to help alleviate your symptoms and to get you feeling better soon:  Z-Pak (azithromycin):  take 2 tablets the first day, then take 1 tablet daily every day thereafter until complete.       Zyrtec  (cetirizine): This is an excellent second-generation antihistamine that helps to reduce respiratory inflammatory response to environmental allergens.  In some patients, this medication can cause daytime sleepiness so I recommend that you give your child this medication at bedtime every day.     Flonase (fluticasone): This is a steroid nasal spray that you use once daily, 1 spray in each nare.  This medication does not work well if you decide to use it only used as you feel you need to, it works best used on a daily basis.  After 3 to 5 days of use, you will notice significant reduction of the inflammation and mucus production that is currently being caused by exposure to allergens, whether seasonal or environmental.  The most common side effect of this medication is nosebleeds.  If you experience a nosebleed, please discontinue use for 1 week, then feel free to resume.  I have provided you with a prescription.     Advil, Motrin (ibuprofen): This is a good anti-inflammatory medication which addresses aches, pains and inflammation of the upper airways that causes sinus and nasal congestion as well as in the lower airways which makes your cough feel tight and sometimes burn.  I recommend that you take between 400 to 600 mg every 6-8 hours as needed.      Robitussin, Mucinex (guaifenesin): This is an expectorant.  This  helps break up chest congestion and loosen up thick nasal drainage making phlegm and drainage more liquid and therefore easier to remove.  I recommend being 400 mg three times daily as needed.      Promethazine DM: Promethazine is both a nasal decongestant and an antinausea medication that makes most patients feel fairly sleepy.  The DM is dextromethorphan, a cough suppressant found in many over-the-counter cough medications.  Please take 5 mL before bedtime to minimize your cough which will help you sleep better.  I have sent a prescription for this medication to your pharmacy.   Please  follow-up within the next 5-7 days either with your primary care provider or urgent care if your symptoms do not resolve.  If you do not have a primary care provider, we will assist you in finding one.        Thank you for visiting urgent care today.  We appreciate the opportunity to participate in your care.

## 2021-12-31 NOTE — ED Triage Notes (Signed)
Chief Complaint: Fever, Sore throat, dizzy, fatigue, headache, cough  Onset: This past Friday  OTC medications tried: Yes- Robitussin, Mucinex, Tylenol, Advil, Vix    with mild relief  Sick exposure: No  New foods or medications: No  Recent Travel: Yes- [place].

## 2021-12-31 NOTE — ED Provider Notes (Signed)
[place]    CSN: [id number] Arrival date & time: 12/31/21  1346    HISTORY   Chief Complaint  Patient presents with   Fever    Entered by patient   HPI Brittney [name] is a pleasant, 51 y.o. female who presents to urgent care today. Patient complains of temperature 99.6, sore throat, pain with swallowing, dizziness, fatigue, body aches, chills headache and cough productive of green sputum.  Patient states her symptoms began 6 days ago.  Patient states she has been taking Robitussin, Mucinex, Tylenol, Advil and Vicks vapor rub with mild relief.  Patient denies known sick exposure, new foods or medications, nausea, vomiting, diarrhea.  Patient states she recently traveled to [place], [place] by plane last week.    The history is provided by the patient.   Past Medical History:  Diagnosis Date   Abscess    to eyelid   Anxiety    Arthritis    KNEES ELBOWS HIPS SHOULDER FINGERS   Asthma    Back pain    Bipolar disorder (HCC)    Depression    BIPOLAR   Dyspnea    Fibromyalgia    GERD (gastroesophageal reflux disease)    Headache syndrome 12/12/2019   Headache(784.0)    MIGRAINES   History of colonoscopy    History of degenerative disc disease    Hyperlipidemia    Major depressive disorder    Memory difficulties 06/21/2014   Neck pain    OA (osteoarthritis) of knee    Obesity    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   Rheumatoid arthritis ([place])    Seizures ([place])    FACIAL SEIZURES LAST 4 DAYS AGO   Patient Active Problem List   Diagnosis Date Noted   Weakness of both legs 08/31/2021   Myalgia, multiple sites 08/31/2021   Back pain with radiculopathy 01/07/2021   Hyperlipidemia    OA (osteoarthritis) of knee    GERD (gastroesophageal reflux disease)    Rheumatoid arthritis ([place])    Headache syndrome 12/12/2019   Bipolar 1 disorder, depressed, moderate ([place]) 12/14/2018   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    Fibromyalgia 02/03/2015   Tendinitis of both rotator cuffs 02/03/2015   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   Insomnia secondary to chronic pain 11/13/2014   Fatigue due to sleep pattern disturbance 11/13/2014   Memory difficulties 06/21/2014   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   PTSD (post-traumatic stress disorder) 12/13/2013   Bipolar depression ([place]) 12/12/2013   PERICARDIAL EFFUSION 12/23/2008   PSEUDOTUMOR CEREBRI 12/05/2008   UNSPECIFIED PLEURAL EFFUSION 12/05/2008   DYSPNEA 12/05/2008   OBESITY 11/19/2008   Migraine 11/19/2008   ASTHMA 11/19/2008   OTHER SPECIFIED DISORDER OF STOMACH AND DUODENUM 03/11/2008   Past Surgical History:  Procedure Laterality Date   CARDIAC CATHETERIZATION  2010   DILATION AND CURETTAGE OF UTERUS  1994   LAPAROSCOPY N/A 11/21/2012   Procedure: LAPAROSCOPY OPERATIVE REMOVAL OF RIGHT TUBE AND OVARY AND FLUID FROM MASS;  Surgeon: Melina Schools, MD;  Location: [facility];  Service: Gynecology;  Laterality: N/A;  1 1/2hrs OR time   OOPHORECTOMY     OB History   No obstetric history on file.    Home Medications    Prior to Admission medications   Medication Sig Start Date End Date Taking? Authorizing Provider  MAGNESIUM PO Take by mouth.    [provider]    Family History Family History  Problem Relation Age  of Onset   Cirrhosis Mother    Cancer - Lung Father    Diabetes Father    Neuropathy Father    Migraines Sister    Stroke Sister    Healthy Sister    Healthy Sister    Healthy Sister    Healthy Brother    Healthy Brother    Healthy Brother    Healthy Brother    Healthy Brother    Healthy Brother    Heart disease Maternal Grandmother    Alzheimer's disease Maternal Grandfather    Cancer - Lung Paternal Grandmother    Breast cancer Daughter 69   Neuropathy Daughter    Social History Social History   Tobacco Use   Smoking status: Former    Types: Cigarettes    Quit date: 02/16/2003    Years since  quitting: 18.8   Smokeless tobacco: Never  Vaping Use   Vaping Use: Never used  Substance Use Topics   Alcohol use: No    Alcohol/week: 0.0 standard drinks of alcohol    Comment: occasionally   Drug use: No   Allergies   Carbamazepine and Nsaids  Review of Systems Review of Systems Pertinent findings revealed after performing a 14 point review of systems has been noted in the history of present illness.  Physical Exam Triage Vital Signs ED Triage Vitals  Enc Vitals Group     BP 12/12/20 0827 (!) 147/82     Pulse Rate 12/12/20 0827 72     Resp 12/12/20 0827 18     Temp 12/12/20 0827 98.3 F (36.8 C)     Temp Source 12/12/20 0827 Oral     SpO2 12/12/20 0827 98 %     Weight --      Height --      Head Circumference --      Peak Flow --      Pain Score 12/12/20 0826 5     Pain Loc --      Pain Edu? --      Excl. in [place]? --   No data found.  Updated Vital Signs BP 111/70 (BP Location: Right Arm)   Pulse 70   Temp 98.8 F (37.1 C) (Oral)   Resp 16   SpO2 100%   Physical Exam Constitutional:      General: She is awake. She is not in acute distress.    Appearance: She is well-developed. She is ill-appearing. She is not toxic-appearing.     Interventions: She is not intubated. HENT:     Head: Normocephalic and atraumatic.     Jaw: There is normal jaw occlusion.     Salivary Glands: Right salivary gland is diffusely enlarged and tender. Left salivary gland is diffusely enlarged and tender.     Right Ear: Hearing, ear canal and external ear normal. No decreased hearing noted. No tenderness. No middle ear effusion. Tympanic membrane is erythematous and bulging. Tympanic membrane is not injected.     Left Ear: Hearing, ear canal and external ear normal. No decreased hearing noted. No tenderness.  No middle ear effusion. Tympanic membrane is erythematous and bulging. Tympanic membrane is not injected.     Ears:     Comments: Bilateral EACs with mild erythema    Nose:  Rhinorrhea present. No mucosal edema or congestion. Rhinorrhea is clear.     Right Turbinates: Enlarged, swollen and pale.     Left Turbinates: Enlarged, swollen and pale.     Right Sinus: No maxillary  sinus tenderness or frontal sinus tenderness.     Left Sinus: No maxillary sinus tenderness or frontal sinus tenderness.     Mouth/Throat:     Lips: Pink. No lesions.     Mouth: Mucous membranes are moist. No oral lesions or angioedema.     Dentition: No gingival swelling.     Tongue: No lesions.     Palate: No mass.     Pharynx: Uvula midline. Pharyngeal swelling, oropharyngeal exudate, posterior oropharyngeal erythema and uvula swelling present.     Tonsils: No tonsillar exudate. 2+ on the right. 2+ on the left.  Eyes:     Extraocular Movements: Extraocular movements intact.     Conjunctiva/sclera: Conjunctivae normal.     Pupils: Pupils are equal, round, and reactive to light.  Neck:     Thyroid: No thyroid mass, thyromegaly or thyroid tenderness.     Trachea: Tracheal tenderness present. No abnormal tracheal secretions or tracheal deviation.     Comments: Voice is muffled Cardiovascular:     Rate and Rhythm: Normal rate and regular rhythm.     Pulses: Normal pulses.     Heart sounds: Normal heart sounds, S1 normal and S2 normal. No murmur heard.    No friction rub. No gallop.  Pulmonary:     Effort: Pulmonary effort is normal. No tachypnea, bradypnea, accessory muscle usage, prolonged expiration, respiratory distress or retractions. She is not intubated.     Breath sounds: Normal breath sounds. No stridor, decreased air movement or transmitted upper airway sounds. No decreased breath sounds, wheezing, rhonchi or rales.     Comments: Coarse breath sounds in the right lower lung fields, otherwise no wheeze, rales or rhonchi appreciated. Abdominal:     General: Abdomen is flat. Bowel sounds are normal.     Palpations: Abdomen is soft.     Tenderness: There is generalized abdominal  tenderness. There is no right CVA tenderness, left CVA tenderness or rebound. Negative signs include Murphy's sign.     Hernia: No hernia is present.  Musculoskeletal:        General: No tenderness. Normal range of motion.     Cervical back: Full passive range of motion without pain, normal range of motion and neck supple.     Right lower leg: No edema.     Left lower leg: No edema.  Lymphadenopathy:     Cervical: Cervical adenopathy present.     Right cervical: Superficial cervical adenopathy and posterior cervical adenopathy present.     Left cervical: Superficial cervical adenopathy and posterior cervical adenopathy present.  Skin:    General: Skin is warm and dry.     Findings: No erythema, lesion or rash.  Neurological:     General: No focal deficit present.     Mental Status: She is alert and oriented to person, place, and time. Mental status is at baseline.     Motor: Motor function is intact.     Coordination: Coordination is intact.     Gait: Gait is intact.     Deep Tendon Reflexes: Reflexes are normal and symmetric.  Psychiatric:        Attention and Perception: Attention and perception normal.        Mood and Affect: Mood and affect normal.        Speech: Speech normal.        Behavior: Behavior normal. Behavior is cooperative.        Thought Content: Thought content normal.  Judgment: Judgment normal.     Visual Acuity Right Eye Distance:   Left Eye Distance:   Bilateral Distance:    Right Eye Near:   Left Eye Near:    Bilateral Near:     UC Couse / Diagnostics / Procedures:     Radiology No results found.  Procedures Procedures (including critical care time) EKG  Pending results:  Labs Reviewed  RESP PANEL BY RT-PCR (FLU A&B, COVID) [id number]  POCT RAPID STREP A, ED / UC  POC INFLUENZA A AND B ANTIGEN (URGENT CARE ONLY)    Medications Ordered in UC: Medications - No data to display  UC Diagnoses / Final Clinical Impressions(s)   I have  reviewed the triage vital signs and the nursing notes.  Pertinent labs & imaging results that were available during my care of the patient were reviewed by me and considered in my medical decision making (see chart for details).    Final diagnoses:  Viral illness  Recent travel on aircraft  Bacterial lower respiratory infection   Patient advised of physical exam findings.  Patient politely deferred chest x-ray at this time.  Patient provided with azithromycin given that she is coughing up green sputum and duration of illness.  COVID-19 and influenza testing performed due to negative POC influenza test.  Strep test that was also negative, do not believe throat culture indicated based on physical exam findings.  Promethazine DM provided for nighttime cough, Robitussin provided for daytime cough.  Patient also advised to begin Zyrtec and Flonase for relief of respiratory inflammation and to calm postnasal drip.  Return precautions advised.  ED Prescriptions     Medication Sig Dispense Auth. Provider   azithromycin (ZITHROMAX) 250 MG tablet Take 2 tablets (500 mg total) by mouth daily for 1 day, THEN 1 tablet (250 mg total) daily for 4 days. 6 tablet Lynden Oxford Scales, PA-C   guaifenesin (HUMIBID E) 400 MG TABS tablet Take 1 tablet 3 times daily as needed for chest congestion and cough 21 tablet Lynden Oxford Scales, PA-C   promethazine-dextromethorphan (PROMETHAZINE-DM) 6.25-15 MG/5ML syrup Take 5 mLs by mouth 4 (four) times daily as needed for cough. 118 mL Lynden Oxford Scales, PA-C   fluticasone (FLONASE) 50 MCG/ACT nasal spray Place 1 spray into both nostrils daily. Begin by using 2 sprays in each nare daily for 3 to 5 days, then decrease to 1 spray in each nare daily. 15.8 mL Lynden Oxford Scales, PA-C   cetirizine (ZYRTEC ALLERGY) 10 MG tablet Take 1 tablet (10 mg total) by mouth at bedtime. 90 tablet Lynden Oxford Scales, [place]      PDMP not reviewed this  encounter.  Disposition Upon Discharge:  Condition: stable for discharge home Home: take medications as prescribed; routine discharge instructions as discussed; follow up as advised.  Patient presented with an acute illness with associated systemic symptoms and significant discomfort requiring urgent management. In my opinion, this is a condition that a prudent lay person (someone who possesses an average knowledge of health and medicine) may potentially expect to result in complications if not addressed urgently such as respiratory distress, impairment of bodily function or dysfunction of bodily organs.   Routine symptom specific, illness specific and/or disease specific instructions were discussed with the patient and/or caregiver at length.   As such, the patient has been evaluated and assessed, work-up was performed and treatment was provided in alignment with urgent care protocols and evidence based medicine.  Patient/parent/caregiver has been advised that the patient  may require follow up for further testing and treatment if the symptoms continue in spite of treatment, as clinically indicated and appropriate.  If the patient was tested for COVID-19, Influenza and/or RSV, then the patient/parent/guardian was advised to isolate at home pending the results of his/her diagnostic coronavirus test and potentially longer if they're positive. I have also advised pt that if his/her COVID-19 test returns positive, it's recommended to self-isolate for at least 10 days after symptoms first appeared AND until fever-free for 24 hours without fever reducer AND other symptoms have improved or resolved. Discussed self-isolation recommendations as well as instructions for household member/close contacts as per the [facility] and [place] DHHS, and also gave patient the [place] packet with this information.  Patient/parent/caregiver has been advised to return to the [facility] or PCP in 3-5 days if no better; to PCP or the Emergency  Department if new signs and symptoms develop, or if the current signs or symptoms continue to change or worsen for further workup, evaluation and treatment as clinically indicated and appropriate  The patient will follow up with their current PCP if and as advised. If the patient does not currently have a PCP we will assist them in obtaining one.   The patient may need specialty follow up if the symptoms continue, in spite of conservative treatment and management, for further workup, evaluation, consultation and treatment as clinically indicated and appropriate.  Patient/parent/caregiver verbalized understanding and agreement of plan as discussed.  All questions were addressed during visit.  Please see discharge instructions below for further details of plan.  Discharge Instructions:   Discharge Instructions      Your strep test today is negative.     Your rapid influenza antigen test today was negative.  We will perform a PCR influenza test today to confirm.  Per our protocol here at urgent care, this test must be ordered at the same time with COVID-19 PCR testing.   The results of your PCR testing will be posted to your MyChart once it is complete.  This typically takes 6 to 12 hours.    If your influenza PCR test is positive, you will be contacted by phone.  Please complete full 5-day course of Tamiflu, a prescription will be provided for you.     If your COVID-19 PCR test is positive, you will be contacted by phone.  Because you do not have a history of being immune compromised, you are currently vaccinated for COVID-19, you are under the age of 65, you do not have a risk of severe disease due to COVID-19, antiviral treatment is not indicated.    If your COVID-19 PCR test is negative, please consider retesting in the next 2 to 3 days, particularly if you are not feeling any better.  You are welcome to return here to urgent care to have it done or you can take a home COVID-19 test.   If  both your COVID-19 tests are negative and your influenza test is negative, then you can safely assume that your illness is due to one of the many less serious illnesses circulating in our community right now.  Conservative care is recommended with rest, drinking plenty of clear fluids, eating only when hungry, taking supportive medications for your symptoms and avoiding being around other people.  Please remain at home until you are fever free for 24 hours without the use of antifever medications such as Tylenol and ibuprofen.    Please read below to learn more about the  medications, dosages and frequencies that I recommend to help alleviate your symptoms and to get you feeling better soon:  Z-Pak (azithromycin):  take 2 tablets the first day, then take 1 tablet daily every day thereafter until complete.       Zyrtec (cetirizine): This is an excellent second-generation antihistamine that helps to reduce respiratory inflammatory response to environmental allergens.  In some patients, this medication can cause daytime sleepiness so I recommend that you give your child this medication at bedtime every day.     Flonase (fluticasone): This is a steroid nasal spray that you use once daily, 1 spray in each nare.  This medication does not work well if you decide to use it only used as you feel you need to, it works best used on a daily basis.  After 3 to 5 days of use, you will notice significant reduction of the inflammation and mucus production that is currently being caused by exposure to allergens, whether seasonal or environmental.  The most common side effect of this medication is nosebleeds.  If you experience a nosebleed, please discontinue use for 1 week, then feel free to resume.  I have provided you with a prescription.     Advil, Motrin (ibuprofen): This is a good anti-inflammatory medication which addresses aches, pains and inflammation of the upper airways that causes sinus and nasal congestion as  well as in the lower airways which makes your cough feel tight and sometimes burn.  I recommend that you take between 400 to 600 mg every 6-8 hours as needed.      Robitussin, Mucinex (guaifenesin): This is an expectorant.  This helps break up chest congestion and loosen up thick nasal drainage making phlegm and drainage more liquid and therefore easier to remove.  I recommend being 400 mg three times daily as needed.      Promethazine DM: Promethazine is both a nasal decongestant and an antinausea medication that makes most patients feel fairly sleepy.  The DM is dextromethorphan, a cough suppressant found in many over-the-counter cough medications.  Please take 5 mL before bedtime to minimize your cough which will help you sleep better.  I have sent a prescription for this medication to your pharmacy.   Please follow-up within the next 5-7 days either with your primary care provider or urgent care if your symptoms do not resolve.  If you do not have a primary care provider, we will assist you in finding one.        Thank you for visiting urgent care today.  We appreciate the opportunity to participate in your care.         This office note has been dictated using Museum/gallery curator.  Unfortunately, this method of dictation can sometimes lead to typographical or grammatical errors.  I apologize for your inconvenience in advance if this occurs.  Please do not hesitate to reach out to me if clarification is needed.      Lynden Oxford Scales, PA-C 12/31/21 1529

## 2021-12-31 NOTE — ED Notes (Signed)
Discharged by [facility], CMA.

## 2022-01-03 ENCOUNTER — Ambulatory Visit (HOSPITAL_COMMUNITY): Payer: Medicare Other

## 2022-01-06 NOTE — Telephone Encounter (Signed)
[employer] Josem Kaufmann: [id number] exp. 12/31/21-02/12/22 for Zacarias Pontes. [phone]

## 2022-01-26 ENCOUNTER — Telehealth (INDEPENDENT_AMBULATORY_CARE_PROVIDER_SITE_OTHER): Payer: Self-pay | Admitting: Adult Health

## 2022-01-26 DIAGNOSIS — R531 Weakness: Secondary | ICD-10-CM

## 2022-01-26 DIAGNOSIS — R200 Anesthesia of skin: Secondary | ICD-10-CM

## 2022-01-26 NOTE — Progress Notes (Deleted)
PATIENT: Brittney [name] DOB: 1970-06-06  REASON FOR VISIT: follow up HISTORY FROM: patient  Virtual Visit via Video Note  I connected with Brittney [name] on 01/26/22 at  1:00 PM EST by a video enabled telemedicine application located remotely at [facility] and verified that I am speaking with the correct person using two identifiers who was located at their own home.   I discussed the limitations of evaluation and management by telemedicine and the availability of in person appointments. The patient expressed understanding and agreed to proceed.   PATIENT: Brittney [name] DOB: 24-Jul-1970  REASON FOR VISIT: follow up HISTORY FROM: patient  HISTORY OF PRESENT ILLNESS: Today 01/26/22:  11/17/2021: Patient is transitioning to me from Dr. Jannifer Franklin who retired. MRI of the brain was ordered 07/06/2021 but was not completed by patient. She states she has "nerve pain all over her body" wakes her up every morning, electric shocks in all extremities and all over her head, no dermatomal pattern, in every spot of her skin when she wakes up. Lasts briefly and then goes away. No weakness, no vision changes, no changes in gait or other focal weakness. Wakes her up at 4am every day. No facial droop just whole body radiating and stabbing and extremely painful. She can have the pain during the day off and on a tingling sharpness right now in both her arms and thighs, symmetrical. B12 1857 3 weeks ago. BMP, cbc also normal. Also right-sided weakness and decreased sensation in the right limbs and right face.   Patient complains of symptoms per HPI as well as the following symptoms: paresthesias and weakness . Pertinent negatives and positives per HPI. All others negative  06/21/21:   Ms. Burges is a 51 year old female  Headaches started back about 1 month ago. Usually starts with mild dizziness. Has events 2-3 times a week. Pain 8/10. Location varies. Photophobia and phonophobia. Occasionally  has nausea typically no vomiting. No numbness and tingling in the extremities.    MRI WO contrast in 2016:IMPRESSION:  This MRI of the brain without contrast shows a mildly enlarged sella turcica with thinned pituitary tissue. Although nonspecific this is commonly seen with pseudotumor cerebri. There are no acute findings.   LP showed opening pressure 21   Dr. Jannifer Franklin did not think it was consistent with pseudotumor cerebri.     Reports that she is having all over body pain. Saw orthopedist.  In the past was told she was told she had fibromyalgia. Not currrently being treated for that. Unable to tolerate lyrica.    Patient feels that her migraines and is left-sided body pain may be interrelated.  I have not seen notes from orthopedist.  HISTORY   REVIEW OF SYSTEMS: Out of a complete 14 system review of symptoms, the patient complains only of the following symptoms, and all other reviewed systems are negative.  ALLERGIES: Allergies  Allergen Reactions   Carbamazepine Rash   Nsaids Swelling     Pt says she can take ibuprofen    HOME MEDICATIONS: Outpatient Medications Prior to Visit  Medication Sig Dispense Refill   cetirizine (ZYRTEC ALLERGY) 10 MG tablet Take 1 tablet (10 mg total) by mouth at bedtime. 90 tablet 1   fluticasone (FLONASE) 50 MCG/ACT nasal spray Place 1 spray into both nostrils daily. Begin by using 2 sprays in each nare daily for 3 to 5 days, then decrease to 1 spray in each nare daily. 15.8 mL 2  guaifenesin (HUMIBID E) 400 MG TABS tablet Take 1 tablet 3 times daily as needed for chest congestion and cough 21 tablet 0   MAGNESIUM PO Take by mouth.     promethazine-dextromethorphan (PROMETHAZINE-DM) 6.25-15 MG/5ML syrup Take 5 mLs by mouth 4 (four) times daily as needed for cough. 118 mL 0   No facility-administered medications prior to visit.    PAST MEDICAL HISTORY: Past Medical History:  Diagnosis Date   Abscess    to eyelid   Anxiety    Arthritis     KNEES ELBOWS HIPS SHOULDER FINGERS   Asthma    Back pain    Bipolar disorder (HCC)    Depression    BIPOLAR   Dyspnea    Fibromyalgia    GERD (gastroesophageal reflux disease)    Headache syndrome 12/12/2019   Headache(784.0)    MIGRAINES   History of colonoscopy    History of degenerative disc disease    Hyperlipidemia    Major depressive disorder    Memory difficulties 06/21/2014   Neck pain    OA (osteoarthritis) of knee    Obesity    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   Rheumatoid arthritis ([place])    Seizures ([place])    FACIAL SEIZURES LAST 4 DAYS AGO    PAST SURGICAL HISTORY: Past Surgical History:  Procedure Laterality Date   CARDIAC CATHETERIZATION  2010   DILATION AND CURETTAGE OF UTERUS  1994   LAPAROSCOPY N/A 11/21/2012   Procedure: LAPAROSCOPY OPERATIVE REMOVAL OF RIGHT TUBE AND OVARY AND FLUID FROM MASS;  Surgeon: Melina Schools, MD;  Location: [facility];  Service: Gynecology;  Laterality: N/A;  1 1/2hrs OR time   OOPHORECTOMY      FAMILY HISTORY: Family History  Problem Relation Age of Onset   Cirrhosis Mother    Cancer - Lung Father    Diabetes Father    Neuropathy Father    Migraines Sister    Stroke Sister    Healthy Sister    Healthy Sister    Healthy Sister    Healthy Brother    Healthy Brother    Healthy Brother    Healthy Brother    Healthy Brother    Healthy Brother    Heart disease Maternal Grandmother    Alzheimer's disease Maternal Grandfather    Cancer - Lung Paternal Grandmother    Breast cancer Daughter 73   Neuropathy Daughter     SOCIAL HISTORY: Social History   Socioeconomic History   Marital status: Divorced    Spouse name: Not on file   Number of children: 5   Years of education: BA   Highest education level: Not on file  Occupational History   Not on file  Tobacco Use   Smoking status: Former    Types: Cigarettes    Quit date: 02/16/2003    Years since quitting: 18.9   Smokeless tobacco: Never  Vaping Use    Vaping Use: Never used  Substance and Sexual Activity   Alcohol use: No    Alcohol/week: 0.0 standard drinks of alcohol    Comment: occasionally   Drug use: No   Sexual activity: Not Currently  Other Topics Concern   Not on file  Social History Narrative   Tobacco use, amount per day now: N/A   Past tobacco use, amount per day: 1/2 packs per day    How many years did you use tobacco: 20 years   Alcohol use (drinks per week): N/A   Diet:  Anti    Do you drink/eat things with caffeine: 1 cup of coffee daily.   Marital status: Divorced                                 What year were you married? 1993 & 2004   Do you live in a house, apartment, assisted living, condo, trailer, etc.? House   Is it one or more stories? 1.5   How many persons live in your home? 2   Do you have pets in your home?( please list) 1 Dog.   Highest Level of education completed? Some graduate, Ambulance person.   Current or past profession: Development worker, international aid, Scientist, physiological, Art gallery manager.    Do you exercise?                                  Type and how often?   Do you have a living will?   Yes   Do you have a DNR form?  Yes                              If not, do you want to discuss one?   Do you have signed POA/HPOA forms?  Yes                      If so, please bring to you appointment      Do you have any difficulty bathing or dressing yourself? Yes   Do you have any difficulty preparing food or eating? Yes   Do you have any difficulty managing your medications? No   Do you have any difficulty managing your finances? Yes   Do you have any difficulty affording your medications? No   Social Determinants of Radio broadcast assistant Strain: Not on file  Food Insecurity: Not on file  Transportation Needs: Not on file  Physical Activity: Not on file  Stress: Not on file  Social Connections: Not on file  Intimate Partner Violence: Not on file      PHYSICAL EXAM Generalized: Well developed, in no acute  distress   Neurological examination  Mentation: Alert oriented to time, place, history taking. Follows all commands speech and language fluent Cranial nerve II-XII:Extraocular movements were full. Facial symmetry noted. uvula tongue midline. Head turning and shoulder shrug  were normal and symmetric. Motor: Good strength throughout subjectively per patient Sensory: Sensory testing is intact to soft touch on all 4 extremities subjectively per patient Coordination: Cerebellar testing reveals good finger-nose-finger  Gait and station: Patient is able to stand from a seated position. gait is normal.  Reflexes: UTA  DIAGNOSTIC DATA (LABS, IMAGING, TESTING) - I reviewed patient records, labs, notes, testing and imaging myself where available.  Lab Results  Component Value Date   WBC 5.0 10/22/2021   HGB 13.4 10/22/2021   HCT 41.9 10/22/2021   MCV 85.9 10/22/2021   PLT 314 10/22/2021      Component Value Date/Time   NA 140 10/22/2021 1640   K 4.5 10/22/2021 1640   CL 105 10/22/2021 1640   CO2 24 10/22/2021 1640   GLUCOSE 84 10/22/2021 1640   BUN 13 10/22/2021 1640   CREATININE 0.88 10/22/2021 1640   CALCIUM 9.6 10/22/2021 1640   PROT 8.0 01/31/2020 1704   ALBUMIN 4.5 01/31/2020 1704  AST 23 01/31/2020 1704   ALT 20 01/31/2020 1704   ALKPHOS 91 01/31/2020 1704   BILITOT 0.7 01/31/2020 1704   GFRNONAA >60 10/22/2021 1640   GFRAA >60 12/14/2018 1833   Lab Results  Component Value Date   CHOL 200 12/14/2018   HDL 58 12/14/2018   LDLCALC 131 (H) 12/14/2018   TRIG 57 12/14/2018   CHOLHDL 3.4 12/14/2018   Lab Results  Component Value Date   HGBA1C 6.0 (H) 12/14/2018   Lab Results  Component Value Date   VITAMINB12 1,857 (H) 10/22/2021   Lab Results  Component Value Date   TSH 0.500 12/14/2018      ASSESSMENT AND PLAN 51 y.o. year old female  has a past medical history of Abscess, Anxiety, Arthritis, Asthma, Back pain, Bipolar disorder ([place]), Depression, Dyspnea,  Fibromyalgia, GERD (gastroesophageal reflux disease), Headache syndrome (12/12/2019), Headache(784.0), History of colonoscopy, History of degenerative disc disease, Hyperlipidemia, Major depressive disorder, Memory difficulties (06/21/2014), Neck pain, OA (osteoarthritis) of knee, Obesity, Pseudotumor cerebri syndrome (05/30/2014), Rheumatoid arthritis ([place]), and Seizures ([place]). here with ***   I spent 15 minutes with the patient. 50% of this time was spent   Ward Givens, MSN, NP-C 01/26/2022, 12:57 PM [facility] [address] [place], [place] [address] [phone]

## 2022-01-26 NOTE — Progress Notes (Signed)
Patient has not gotten MRI yet. She will reschedule once those test are completed.

## 2022-02-28 ENCOUNTER — Ambulatory Visit (HOSPITAL_COMMUNITY)
Admission: RE | Admit: 2022-02-28 | Discharge: 2022-02-28 | Disposition: A | Discharge status: Home / Self Care | Payer: Medicare Other | Source: Ambulatory Visit | Attending: Family | Admitting: Family

## 2022-02-28 ENCOUNTER — Emergency Department (HOSPITAL_COMMUNITY): Payer: Medicare Other

## 2022-02-28 ENCOUNTER — Emergency Department (HOSPITAL_COMMUNITY)
Admission: EM | Admit: 2022-02-28 | Discharge: 2022-02-28 | Disposition: A | Discharge status: Home / Self Care | Payer: Medicare Other | Attending: Emergency Medicine | Admitting: Emergency Medicine

## 2022-02-28 ENCOUNTER — Ambulatory Visit (HOSPITAL_COMMUNITY): Payer: Medicare Other

## 2022-02-28 ENCOUNTER — Encounter (HOSPITAL_COMMUNITY): Payer: Self-pay

## 2022-02-28 ENCOUNTER — Other Ambulatory Visit: Payer: Self-pay

## 2022-02-28 VITALS — BP 117/83 | HR 68 | Temp 98.9°F | Resp 18

## 2022-02-28 DIAGNOSIS — R208 Other disturbances of skin sensation: Secondary | ICD-10-CM

## 2022-02-28 DIAGNOSIS — R531 Weakness: Secondary | ICD-10-CM | POA: Diagnosis not present

## 2022-02-28 LAB — CBC WITH DIFFERENTIAL/PLATELET
Abs Immature Granulocytes: 0.01 10*3/uL (ref 0.00–0.07)
Basophils Absolute: 0 10*3/uL (ref 0.0–0.1)
Basophils Relative: 1 %
Eosinophils Absolute: 0.1 10*3/uL (ref 0.0–0.5)
Eosinophils Relative: 1 %
HCT: 38.7 % (ref 36.0–46.0)
Hemoglobin: 12.5 g/dL (ref 12.0–15.0)
Immature Granulocytes: 0 %
Lymphocytes Relative: 49 %
Lymphs Abs: 2.5 10*3/uL (ref 0.7–4.0)
MCH: 27.4 pg (ref 26.0–34.0)
MCHC: 32.3 g/dL (ref 30.0–36.0)
MCV: 84.7 fL (ref 80.0–100.0)
Monocytes Absolute: 0.4 10*3/uL (ref 0.1–1.0)
Monocytes Relative: 8 %
Neutro Abs: 2.1 10*3/uL (ref 1.7–7.7)
Neutrophils Relative %: 41 %
Platelets: 353 10*3/uL (ref 150–400)
RBC: 4.57 MIL/uL (ref 3.87–5.11)
RDW: 12.9 % (ref 11.5–15.5)
WBC: 5.1 10*3/uL (ref 4.0–10.5)
nRBC: 0 % (ref 0.0–0.2)

## 2022-02-28 LAB — COMPREHENSIVE METABOLIC PANEL
ALT: 22 U/L (ref 0–44)
AST: 31 U/L (ref 15–41)
Albumin: 3.9 g/dL (ref 3.5–5.0)
Alkaline Phosphatase: 81 U/L (ref 38–126)
Anion gap: 8 (ref 5–15)
BUN: 8 mg/dL (ref 6–20)
CO2: 25 mmol/L (ref 22–32)
Calcium: 9.4 mg/dL (ref 8.9–10.3)
Chloride: 104 mmol/L (ref 98–111)
Creatinine, Ser: 0.73 mg/dL (ref 0.44–1.00)
GFR, Estimated: >60 mL/min (ref >60)
Glucose, Bld: 100 mg/dL — ABNORMAL HIGH (ref 70–99)
Potassium: 4.2 mmol/L (ref 3.5–5.1)
Sodium: 137 mmol/L (ref 135–145)
Total Bilirubin: 0.5 mg/dL (ref 0.3–1.2)
Total Protein: 7.4 g/dL (ref 6.5–8.1)

## 2022-02-28 LAB — MAGNESIUM: Magnesium: 2.1 mg/dL (ref 1.7–2.4)

## 2022-02-28 MED ORDER — GABAPENTIN 100 MG PO CAPS: 100.0000 mg | ORAL_CAPSULE | Freq: Two times a day (BID) | ORAL | 0 refills | Status: DC

## 2022-02-28 MED ORDER — MORPHINE SULFATE (PF) 4 MG/ML IV SOLN
4.0000 mg | Freq: Once | INTRAVENOUS | Status: AC
  Administered 2022-02-28: 4 mg via INTRAVENOUS
  Filled 2022-02-28: qty 1

## 2022-02-28 MED ORDER — LORAZEPAM 2 MG/ML IJ SOLN
0.5000 mg | Freq: Once | INTRAMUSCULAR | Status: AC
  Administered 2022-02-28: 0.5 mg via INTRAVENOUS
  Filled 2022-02-28: qty 1

## 2022-02-28 NOTE — ED Notes (Signed)
Patient back in room from MRI.

## 2022-02-28 NOTE — Discharge Instructions (Signed)
You have been seen and discharged from the emergency department.  A prescription for gabapentin at a low dose has been sent for you.  Follow-up with neurology this week for review of MRI/new prescription.  Follow-up with your primary provider for further evaluation and further care. Take home medications as prescribed. If you have any worsening symptoms or further concerns for your health please return to an emergency department for further evaluation.

## 2022-02-28 NOTE — ED Triage Notes (Signed)
Pt c/o foot and ankle pain on right side for week. Reports woke up today at 6am with weakness on entire right side. Went to bed at 11pm last night, reports right side "felt little different than left side" when went to sleep.

## 2022-02-28 NOTE — Discharge Instructions (Signed)
Please go to the emergency department for further evaluation immediately.

## 2022-02-28 NOTE — ED Notes (Signed)
Patient transported to MRI 

## 2022-02-28 NOTE — ED Provider Triage Note (Signed)
Emergency Medicine Provider Triage Evaluation Note  Brittney [name] , a 52 y.o. female  was evaluated in triage.  Pt complains of right-sided numbness which began last night.  Patient with a prior history of pseudotumor cerebri, on Topamax she has began experiencing some pain to her right lower extremity, today the extremity felt weaker.  She reports she is at a point where she cannot ambulate.  Went to urgent care and she was sent here for further evaluation.  Does have a history of nerve pain?  But reports she has not experience weakness and numbness to the right side in the past.Last known normal last night.  Review of Systems  Positive: Tingling, numbness, back pain Negative: Fever, headache  Physical Exam  BP 121/84 (BP Location: Right Arm)   Pulse 61   Temp 99 F (37.2 C) (Oral)   Resp 18   SpO2 99%  Gen:   Awake, no distress   Resp:  Normal effort  MSK:   Moves extremities without difficulty  Other:  Cree sensation to her entire upper and lower extremities.  No facial asymmetry.  Medical Decision Making  Medically screening exam initiated at 12:39 PM.  Appropriate orders placed.  Brittney [name] was informed that the remainder of the evaluation will be completed by another provider, this initial triage assessment does not replace that evaluation, and the importance of remaining in the ED until their evaluation is complete.     Brittney Fitting, PA-C 02/28/22 1244

## 2022-02-28 NOTE — ED Provider Notes (Signed)
[facility] EMERGENCY DEPARTMENT Provider Note   CSN: [id number] Arrival date & time: 02/28/22  1227     History  Chief Complaint  Patient presents with   Stroke Symptoms    Brittney [name] is a 52 y.o. female.  HPI   52 year old female with past medical history of fibromyalgia, pseudotumor cerebri, RA presents to the emergency department with concern for right-sided pain/weakness.  Patient has history of body wide pain.  She is following as an outpatient with neurology who is pursuing imaging and further evaluation.  Patient states all day yesterday her "right side felt different".  Initially it felt different and that it was more painful than normal.  Specifically the right lower extremity was having more pronounced sharp shocklike pain.  This progressed to her entire right side of her body and she felt weak on the right side yesterday.  This was more pronounced when she woke up this morning and she was having difficulty walking secondary to pain/weakness.  She also describes scattered numbness on the right side of the body.  She has a headache at baseline.  But denies any other acute neurosymptoms including vision changes, facial droop.  No recent fever, head/neck injury, new medication.  Home Medications Prior to Admission medications   Medication Sig Start Date End Date Taking? Authorizing Provider  cetirizine (ZYRTEC ALLERGY) 10 MG tablet Take 1 tablet (10 mg total) by mouth at bedtime. 12/31/21 06/29/22  Lynden Oxford Scales, PA-C  fluticasone (FLONASE) 50 MCG/ACT nasal spray Place 1 spray into both nostrils daily. Begin by using 2 sprays in each nare daily for 3 to 5 days, then decrease to 1 spray in each nare daily. 12/31/21   Lynden Oxford Scales, PA-C  guaifenesin (HUMIBID E) 400 MG TABS tablet Take 1 tablet 3 times daily as needed for chest congestion and cough 12/31/21   Lynden Oxford Scales, PA-C  MAGNESIUM PO Take by mouth.    [provider]   promethazine-dextromethorphan (PROMETHAZINE-DM) 6.25-15 MG/5ML syrup Take 5 mLs by mouth 4 (four) times daily as needed for cough. 12/31/21   Lynden Oxford Scales, PA-C      Allergies    Carbamazepine and Nsaids    Review of Systems   Review of Systems  Constitutional:  Negative for fever.  Respiratory:  Negative for shortness of breath.   Cardiovascular:  Negative for chest pain.  Gastrointestinal:  Negative for abdominal pain, diarrhea and vomiting.  Skin:  Negative for rash.  Neurological:  Positive for headaches. Negative for dizziness, seizures, syncope and speech difficulty.       + body wide pain, right sided weakness    Physical Exam Updated Vital Signs BP 123/87   Pulse (!) 58   Temp 99 F (37.2 C) (Oral)   Resp 16   Ht '5\' 8"'$  (1.727 m)   Wt 99.3 kg   SpO2 100%   BMI 33.29 kg/m  Physical Exam Vitals and nursing note reviewed.  Constitutional:      General: She is not in acute distress.    Appearance: Normal appearance.  HENT:     Head: Normocephalic.     Mouth/Throat:     Mouth: Mucous membranes are moist.  Eyes:     Extraocular Movements: Extraocular movements intact.     Pupils: Pupils are equal, round, and reactive to light.  Cardiovascular:     Rate and Rhythm: Normal rate.  Pulmonary:     Effort: Pulmonary effort is normal. No respiratory distress.  Abdominal:     Palpations: Abdomen is soft.     Tenderness: There is no abdominal tenderness.  Musculoskeletal:     Cervical back: Tenderness present. No rigidity.     Comments: Equal pulses in the lower extremities, feet are well-perfused, diffuse tenderness to palpation of the entire right lower extremity even to superficial touch, slight decreased strength in the right lower extremity which appears to be secondary to pain.  Skin:    General: Skin is warm.  Neurological:     Mental Status: She is alert and oriented to person, place, and time. Mental status is at baseline.     Cranial Nerves: No  cranial nerve deficit.  Psychiatric:        Mood and Affect: Mood normal.     ED Results / Procedures / Treatments   Labs (all labs ordered are listed, but only abnormal results are displayed) Labs Reviewed  COMPREHENSIVE METABOLIC PANEL - Abnormal; Notable for the following components:      Result Value   Glucose, Bld 100 (*)    All other components within normal limits  CBC WITH DIFFERENTIAL/PLATELET  MAGNESIUM    EKG None  Radiology CT HEAD WO CONTRAST (5MM)  Result Date: 02/28/2022 CLINICAL DATA:  RIGHT-sided numbness and tingling starting this morning. EXAM: CT HEAD WITHOUT CONTRAST TECHNIQUE: Contiguous axial images were obtained from the base of the skull through the vertex without intravenous contrast. RADIATION DOSE REDUCTION: This exam was performed according to the departmental dose-optimization program which includes automated exposure control, adjustment of the mA and/or kV according to patient size and/or use of iterative reconstruction technique. COMPARISON:  CT dated 12/18/2014. FINDINGS: Brain: Ventricles are within normal limits in size and configuration. No mass, hemorrhage, edema or other evidence of acute parenchymal abnormality. No extra-axial hemorrhage. Vascular: No hyperdense vessel or unexpected calcification. Skull: Normal. Negative for fracture or focal lesion. Sinuses/Orbits: No acute finding. Other: None. IMPRESSION: Negative head CT. No intracranial mass, hemorrhage or edema. Electronically Signed   By: Franki Cabot M.D.   On: 02/28/2022 14:26    Procedures Procedures    Medications Ordered in ED Medications - No data to display  ED Course/ Medical Decision Making/ A&P                             Medical Decision Making Amount and/or Complexity of Data Reviewed Radiology: ordered.  Risk Prescription drug management.   52 year old female presents emergency department concern for whole body pain, acutely worse on the right side with  associated weakness.  Vital signs are stable on arrival.  Patient is tender to palpation of the right side of the body even to light touch.  She has slight weakness when compared to the left side but this is pain limited.  Review of outpatient neurology records show that she was plan to get an MRI of the brain/cervical spine.  This was completed today without any abnormalities.  Continue review of the notes show that she was about to be started on gabapentin.  After pain medicine here the patient's symptoms are slightly improved.  She feels stable going home.  Will start her on low-dose gabapentin per neurology notes and she will follow-up with her neurologist this week for review of the MRI and new prescription.  Patient at this time appears safe and stable for discharge and close outpatient follow up. Discharge plan and strict return to ED precautions discussed, patient verbalizes understanding  and agreement.        Final Clinical Impression(s) / ED Diagnoses Final diagnoses:  None    Rx / DC Orders ED Discharge Orders     None         Lorelle Gibbs, DO 02/28/22 2030

## 2022-02-28 NOTE — ED Provider Notes (Signed)
Patient presents complaining of right sided upper and lower extremity weakness that started around 0600 this morning.  She reports decreased sensation on the whole right side of her body. Patient reports that she is now having difficulty with ambulation.  She reports a history of having full body neuropathic pain, she states that she has been awaiting to have some scans done by the neurologist, but she has had difficulty due to her insurance not covering the scans.  Patient reports headache, bilateral eye pain, shortness of breath, chest pain, and overall feeling unwell. She denies visual disturbance.  Patient is being sent to the emergency department for further evaluation, her son is present for transport.  Patient is alert and oriented x 4.  Patient is being taken by wheelchair by UC staff to her son's vehicle.  Vital signs are stable.    Flossie Dibble, NP 02/28/22 [phone]

## 2022-02-28 NOTE — ED Notes (Signed)
Patient is being discharged from the Urgent Care and sent to the Emergency Department via POV with family. Per Juventino Slovak, NP, patient is in need of higher level of care due to weakness on right side of body, dizziness. Patient is aware and verbalizes understanding of plan of care.  Vitals:   02/28/22 1153  BP: 117/83  Pulse: 68  Resp: 18  Temp: 98.9 F (37.2 C)  SpO2: 96%

## 2022-02-28 NOTE — ED Triage Notes (Signed)
Pt c/o right sided numbness and tingling that started at 0730 today upon waking. Pt states she felt little weakness on right side last night before bed, but it got worse upon waking today. Pt states she was having pain in right side of body that started last week and has progressed into numbness and tingling at 2300 last night. Pt denies any other sx.

## 2022-03-02 ENCOUNTER — Telehealth: Payer: Self-pay | Admitting: Adult Health

## 2022-03-02 NOTE — Telephone Encounter (Signed)
Pt has called stating she was able to have CT and MRI done at hospital sine her insurance would not approve when requested thru [facility].  Pt states she initially started with all over nerve pain, Pt states the weakness on right side is new and [name], NP has never addressed that.  Phone rep explained with it being something new she would have to see Dr Jaynee Eagles 1st.  Pt accepted Dr Cathren Laine next available with wait list but expressed her dissatisfaction and questioned what is she to do re: the numbness that she is feeling as well.  It was explained she is welcome to check in for cancellations , but if her pain and numbness are that concerning that she should either call 911 or try and get to an ED or Urgent care.  Pt would like a call from RN re: anything that can be suggested while she waits to be seen by Dr Jaynee Eagles

## 2022-03-03 NOTE — Telephone Encounter (Addendum)
Made appt for pt 03-23-2022 with MM/NP, as first available will waitlist as well. I  do not have any earlier time.  Will call pt to let her know.

## 2022-03-04 NOTE — Telephone Encounter (Signed)
There is an appt with Dr. Jaynee Eagles for 03-10-2022 at 1100, this held for pt if she wants.

## 2022-03-04 NOTE — Telephone Encounter (Signed)
Called pt. Informed her of message from nurse Katharine Look. Pt accepted appointment to see Dr Jaynee Eagles on 1/24 @ 11am.

## 2022-03-04 NOTE — Telephone Encounter (Signed)
Called and LMVM for pt to return call.

## 2022-03-04 NOTE — Telephone Encounter (Signed)
thanks

## 2022-03-10 ENCOUNTER — Ambulatory Visit: Payer: Medicare Other | Admitting: Neurology

## 2022-03-23 ENCOUNTER — Ambulatory Visit: Payer: Medicare Other | Admitting: Adult Health

## 2022-05-04 ENCOUNTER — Ambulatory Visit: Payer: Medicare Other | Admitting: Neurology

## 2022-07-13 ENCOUNTER — Encounter: Payer: Self-pay | Admitting: Family

## 2022-07-13 ENCOUNTER — Ambulatory Visit (INDEPENDENT_AMBULATORY_CARE_PROVIDER_SITE_OTHER): Payer: Medicare Other | Admitting: Family

## 2022-07-13 ENCOUNTER — Ambulatory Visit
Admission: RE | Admit: 2022-07-13 | Discharge: 2022-07-13 | Disposition: A | Discharge status: Home / Self Care | Payer: Medicare Other | Source: Ambulatory Visit | Attending: Family | Admitting: Family

## 2022-07-13 VITALS — BP 118/70 | HR 63 | Temp 98.6°F | Ht 68.0 in | Wt 229.0 lb

## 2022-07-13 DIAGNOSIS — G8929 Other chronic pain: Secondary | ICD-10-CM | POA: Diagnosis not present

## 2022-07-13 DIAGNOSIS — M25512 Pain in left shoulder: Secondary | ICD-10-CM | POA: Diagnosis not present

## 2022-07-13 NOTE — Patient Instructions (Signed)
-   apply heat/ice to left shoulder for pain  - continue with Motrin for pain as needed  - Notify provider if symptoms worsen or fail to improve  - Please get left shoulder  X-ray at [facility] then will call you with results.

## 2022-07-13 NOTE — Progress Notes (Signed)
Provider: Richarda Blade FNP-C  Naliah Eddington, Donalee Citrin, NP  Patient Care Team: Kamica Florance, Donalee Citrin, NP as PCP - General (Family Medicine) Aura Camps, MD as Consulting Physician (Ophthalmology) Karolee Ohs, NP as Nurse Practitioner (Nurse Practitioner) Annita Brod, MD as Consulting Physician (Internal Medicine)  Extended Emergency Contact Information Primary Emergency Contact: Pricilla Loveless Mobile Phone: [phone] Relation: Son Secondary Emergency Contact: Dorice Lamas [facility] Home Phone: [phone] Relation: Other  Code Status:  Full Code  Goals of care: Advanced Directive information    08/31/2021    3:31 PM  Advanced Directives  Does Patient Have a Medical Advance Directive? No  Would patient like information on creating a medical advance directive? No - Patient declined     Chief Complaint  Patient presents with   Acute Visit    Patient presents today for left shoulder pain for about 3 weeks now. She reports taking ibuprofen for the pain and its not helping.    HPI:  Pt is a 52 y.o. female seen today for an acute visit for evaluation of left shoulder pain.Rates pain 5/10 on scale and described as burning pitching pain.  Has taken tylenol and motrin which most of the time it helps.States no injuries or previous surgeries.Also denies any weakness,numbness or tingling on hand.    Past Medical History:  Diagnosis Date   Abscess    to eyelid   Anxiety    Arthritis    KNEES ELBOWS HIPS SHOULDER FINGERS   Asthma    Back pain    Bipolar disorder (HCC)    Depression    BIPOLAR   Dyspnea    Fibromyalgia    GERD (gastroesophageal reflux disease)    Headache syndrome 12/12/2019   Headache(784.0)    MIGRAINES   History of colonoscopy    History of degenerative disc disease    Hyperlipidemia    Major depressive disorder    Memory difficulties 06/21/2014   Neck pain    OA (osteoarthritis) of knee    Obesity    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    Rheumatoid arthritis (HCC)    Seizures (HCC)    FACIAL SEIZURES LAST 4 DAYS AGO   Past Surgical History:  Procedure Laterality Date   CARDIAC CATHETERIZATION  2010   DILATION AND CURETTAGE OF UTERUS  1994   LAPAROSCOPY N/A 11/21/2012   Procedure: LAPAROSCOPY OPERATIVE REMOVAL OF RIGHT TUBE AND OVARY AND FLUID FROM MASS;  Surgeon: Bing Plume, MD;  Location: [facility];  Service: Gynecology;  Laterality: N/A;  1 1/2hrs OR time   OOPHORECTOMY      Allergies  Allergen Reactions   Carbamazepine Rash   Nsaids Swelling     Pt says she can take ibuprofen    Outpatient Encounter Medications as of 07/13/2022  Medication Sig   fluticasone (FLONASE) 50 MCG/ACT nasal spray Place 1 spray into both nostrils daily. Begin by using 2 sprays in each nare daily for 3 to 5 days, then decrease to 1 spray in each nare daily.   gabapentin (NEURONTIN) 100 MG capsule Take 1 capsule (100 mg total) by mouth 2 (two) times daily.   guaifenesin (HUMIBID E) 400 MG TABS tablet Take 1 tablet 3 times daily as needed for chest congestion and cough   MAGNESIUM PO Take by mouth.   promethazine-dextromethorphan (PROMETHAZINE-DM) 6.25-15 MG/5ML syrup Take 5 mLs by mouth 4 (four) times daily as needed for cough.   cetirizine (ZYRTEC ALLERGY) 10 MG tablet Take 1 tablet (10 mg total)  by mouth at bedtime.   No facility-administered encounter medications on file as of 07/13/2022.    Review of Systems  Constitutional:  Negative for appetite change, chills, fatigue, fever and unexpected weight change.  Respiratory:  Negative for cough, chest tightness, shortness of breath and wheezing.   Cardiovascular:  Negative for chest pain, palpitations and leg swelling.  Musculoskeletal:  Positive for arthralgias. Negative for back pain, gait problem, joint swelling, myalgias, neck pain and neck stiffness.       Left shoulder pain   Skin:  Negative for color change, pallor and rash.  Neurological:  Negative for dizziness, weakness,  light-headedness, numbness and headaches.    Immunization History  Administered Date(s) Administered   Influenza Split 12/03/2010   Janssen (J&J) SARS-COV-2 Vaccination 05/24/2019   Pertinent  Health Maintenance Due  Topic Date Due   PAP SMEAR-Modifier  Never done   INFLUENZA VACCINE  09/16/2022   MAMMOGRAM  11/11/2023   Colonoscopy  01/25/2029      08/31/2021    3:31 PM 10/22/2021    3:32 PM 12/31/2021    2:33 PM 02/28/2022   11:52 AM 02/28/2022   12:51 PM  Fall Risk  Falls in the past year? 1      Was there an injury with Fall? 0      Fall Risk Category Calculator 1      Fall Risk Category (Retired) Low      (RETIRED) Patient Fall Risk Level Low fall risk Low fall risk Low fall risk High fall risk Moderate fall risk  Patient at Risk for Falls Due to Impaired balance/gait      Fall risk Follow up Falls evaluation completed;Education provided;Falls prevention discussed       Functional Status Survey:    Vitals:   07/13/22 1354  BP: 118/70  Pulse: 63  Temp: 98.6 F (37 C)  SpO2: 98%  Weight: 229 lb (103.9 kg)  Height: 5\' 8"  (1.727 m)   Body mass index is 34.82 kg/m. Physical Exam Vitals reviewed.  Constitutional:      General: She is not in acute distress.    Appearance: Normal appearance. She is obese. She is not ill-appearing or diaphoretic.  HENT:     Head: Normocephalic.  Cardiovascular:     Rate and Rhythm: Normal rate and regular rhythm.     Pulses: Normal pulses.     Heart sounds: Normal heart sounds. No murmur heard.    No friction rub. No gallop.  Pulmonary:     Effort: Pulmonary effort is normal. No respiratory distress.     Breath sounds: Normal breath sounds. No wheezing, rhonchi or rales.  Chest:     Chest wall: No tenderness.  Musculoskeletal:        General: No swelling.     Right shoulder: Normal.     Left shoulder: Tenderness present. No swelling, deformity, effusion or crepitus. Decreased range of motion. Normal strength. Normal pulse.      Cervical back: Normal range of motion. No rigidity or tenderness.     Right lower leg: No edema.     Left lower leg: No edema.     Comments: Anterior AC joint tenderness on exam   Lymphadenopathy:     Cervical: No cervical adenopathy.  Skin:    General: Skin is warm and dry.     Coloration: Skin is not pale.     Findings: No bruising, erythema, lesion or rash.  Neurological:     Mental Status: She  is alert and oriented to person, place, and time.     Sensory: No sensory deficit.     Motor: No weakness.     Gait: Gait normal.  Psychiatric:        Mood and Affect: Mood normal.        Speech: Speech normal.        Behavior: Behavior normal.     Labs reviewed: Recent Labs    10/22/21 1640 02/28/22 1245  NA 140 137  K 4.5 4.2  CL 105 104  CO2 24 25  GLUCOSE 84 100*  BUN 13 8  CREATININE 0.88 0.73  CALCIUM 9.6 9.4  MG  --  2.1   Recent Labs    02/28/22 1245  AST 31  ALT 22  ALKPHOS 81  BILITOT 0.5  PROT 7.4  ALBUMIN 3.9   Recent Labs    10/22/21 1640 02/28/22 1245  WBC 5.0 5.1  NEUTROABS  --  2.1  HGB 13.4 12.5  HCT 41.9 38.7  MCV 85.9 84.7  PLT 314 353   Lab Results  Component Value Date   TSH 0.500 12/14/2018   Lab Results  Component Value Date   HGBA1C 6.0 (H) 12/14/2018   Lab Results  Component Value Date   CHOL 200 12/14/2018   HDL 58 12/14/2018   LDLCALC 131 (H) 12/14/2018   TRIG 57 12/14/2018   CHOLHDL 3.4 12/14/2018    Significant Diagnostic Results in last 30 days:  No results found.  Assessment/Plan   Chronic left shoulder pain Limited ROM with internal and external rotation of shoulder.Also anterior AC joint tenderness with palpation. No weakness,sensation and circulation intact - OTC extra strength tylenol as needed for pain  - will obtain imaging then refer to orthopedic or physical therapy.  - advised to notify provider or go to ED if symptoms worsen - DG Shoulder Left; Future  Family/ staff Communication: Reviewed plan  of care with patient verbalized understanding   Labs/tests ordered:  - DG Shoulder Left; Future  Next Appointment: Return in about 7 weeks (around 09/01/2022) for annual Physical examination.   Caesar Bookman, NP

## 2022-07-16 ENCOUNTER — Other Ambulatory Visit: Payer: Self-pay

## 2022-07-16 DIAGNOSIS — G8929 Other chronic pain: Secondary | ICD-10-CM

## 2022-07-22 ENCOUNTER — Ambulatory Visit (INDEPENDENT_AMBULATORY_CARE_PROVIDER_SITE_OTHER): Payer: Medicare Other | Admitting: Orthopaedic Surgery

## 2022-07-22 ENCOUNTER — Telehealth: Payer: Self-pay

## 2022-07-22 DIAGNOSIS — M25512 Pain in left shoulder: Secondary | ICD-10-CM

## 2022-07-22 DIAGNOSIS — G8929 Other chronic pain: Secondary | ICD-10-CM

## 2022-07-22 MED ORDER — DICLOFENAC SODIUM 75 MG PO TBEC: 75.0000 mg | DELAYED_RELEASE_TABLET | Freq: Two times a day (BID) | ORAL | 2 refills | Status: DC | PRN

## 2022-07-22 NOTE — Telephone Encounter (Signed)
Called patient. She was prescribed Diclofenac, but pharmacy states that due to allergies that do not want to fill. I have called and no answer. LMOM for patient to call me back concerning medication.

## 2022-07-22 NOTE — Progress Notes (Signed)
Office Visit Note   Patient: Brittney [name]           Date of Birth: January 04, 1971           MRN: [id number] Visit Date: 07/22/2022              Requested by: Caesar Bookman, NP [address] [place],  [place] [address] PCP: Caesar Bookman, NP   Assessment & Plan: Visit Diagnoses:  1. Chronic left shoulder pain     Plan: Impression is 52 year old female with chronic left shoulder pain suspect osteoarthritis flare.  Findings reviewed with the patient and treatment options and recommendations were discussed and she would like to try a glenohumeral injection.  We will send her to Dr. Shon Baton for this.  She will follow-up if symptoms do not improve from this injection.  Follow-Up Instructions: No follow-ups on file.   Orders:  Orders Placed This Encounter  Procedures   Ambulatory referral to Physical Therapy   Meds ordered this encounter  Medications   DISCONTD: diclofenac (VOLTAREN) 75 MG EC tablet    Sig: Take 1 tablet (75 mg total) by mouth 2 (two) times daily as needed.    Dispense:  60 tablet    Refill:  2      Procedures: No procedures performed   Clinical Data: No additional findings.   Subjective: Chief Complaint  Patient presents with   Left Shoulder - Pain    HPI Brittney [name] is a 52 year old female here for evaluation of left shoulder pain for about 4 weeks without any recent injuries.  She is not sure if she has repeatedly injured this over time from doing yoga.  She reports pain to the front of the shoulder.  Tylenol takes the edge off and Motrin helps but hurts her stomach.  Ice gives her brief relief.  Denies any radicular symptoms. Review of Systems  Constitutional: Negative.   HENT: Negative.    Eyes: Negative.   Respiratory: Negative.    Cardiovascular: Negative.   Endocrine: Negative.   Musculoskeletal: Negative.   Neurological: Negative.   Hematological: Negative.   Psychiatric/Behavioral: Negative.    All other systems reviewed and are  negative.    Objective: Vital Signs: There were no vitals taken for this visit.  Physical Exam Vitals and nursing note reviewed.  Constitutional:      Appearance: She is well-developed.  HENT:     Head: Normocephalic and atraumatic.  Pulmonary:     Effort: Pulmonary effort is normal.  Abdominal:     Palpations: Abdomen is soft.  Musculoskeletal:     Cervical back: Neck supple.  Skin:    General: Skin is warm.     Capillary Refill: Capillary refill takes less than 2 seconds.  Neurological:     Mental Status: She is alert and oriented to person, place, and time.  Psychiatric:        Behavior: Behavior normal.        Thought Content: Thought content normal.        Judgment: Judgment normal.     Ortho Exam Examination of left shoulder shows normal active and passive range of motion.  Rotator cuff strength is normal to manual muscle testing.  AC joint is nontender.  No impingement signs. Specialty Comments:  No specialty comments available.  Imaging: No results found.   PMFS History: Patient Active Problem List   Diagnosis Date Noted   Weakness of both legs 08/31/2021   Myalgia, multiple sites 08/31/2021  Back pain with radiculopathy 01/07/2021   Hyperlipidemia    OA (osteoarthritis) of knee    GERD (gastroesophageal reflux disease)    Rheumatoid arthritis (HCC)    Headache syndrome 12/12/2019   Bipolar 1 disorder, depressed, moderate (HCC) 12/14/2018   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   Fibromyalgia 02/03/2015   Tendinitis of both rotator cuffs 02/03/2015   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   Insomnia secondary to chronic pain 11/13/2014   Fatigue due to sleep pattern disturbance 11/13/2014   Memory difficulties 06/21/2014   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   PTSD (post-traumatic stress disorder) 12/13/2013   Bipolar depression (HCC) 12/12/2013   PERICARDIAL EFFUSION 12/23/2008   PSEUDOTUMOR  CEREBRI 12/05/2008   UNSPECIFIED PLEURAL EFFUSION 12/05/2008   DYSPNEA 12/05/2008   OBESITY 11/19/2008   Migraine 11/19/2008   ASTHMA 11/19/2008   OTHER SPECIFIED DISORDER OF STOMACH AND DUODENUM 03/11/2008   Past Medical History:  Diagnosis Date   Abscess    to eyelid   Anxiety    Arthritis    KNEES ELBOWS HIPS SHOULDER FINGERS   Asthma    Back pain    Bipolar disorder (HCC)    Depression    BIPOLAR   Dyspnea    Fibromyalgia    GERD (gastroesophageal reflux disease)    Headache syndrome 12/12/2019   Headache(784.0)    MIGRAINES   History of colonoscopy    History of degenerative disc disease    Hyperlipidemia    Major depressive disorder    Memory difficulties 06/21/2014   Neck pain    OA (osteoarthritis) of knee    Obesity    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   Rheumatoid arthritis (HCC)    Seizures (HCC)    FACIAL SEIZURES LAST 4 DAYS AGO    Family History  Problem Relation Age of Onset   Cirrhosis Mother    Cancer - Lung Father    Diabetes Father    Neuropathy Father    Migraines Sister    Stroke Sister    Healthy Sister    Healthy Sister    Healthy Sister    Healthy Brother    Healthy Brother    Healthy Brother    Healthy Brother    Healthy Brother    Healthy Brother    Heart disease Maternal Grandmother    Alzheimer's disease Maternal Grandfather    Cancer - Lung Paternal Grandmother    Breast cancer Daughter 17   Neuropathy Daughter     Past Surgical History:  Procedure Laterality Date   CARDIAC CATHETERIZATION  2010   DILATION AND CURETTAGE OF UTERUS  1994   LAPAROSCOPY N/A 11/21/2012   Procedure: LAPAROSCOPY OPERATIVE REMOVAL OF RIGHT TUBE AND OVARY AND FLUID FROM MASS;  Surgeon: Bing Plume, MD;  Location: [facility];  Service: Gynecology;  Laterality: N/A;  1 1/2hrs OR time   OOPHORECTOMY     Social History   Occupational History   Not on file  Tobacco Use   Smoking status: Former    Types: Cigarettes    Quit date: 02/16/2003     Years since quitting: 19.4   Smokeless tobacco: Never  Vaping Use   Vaping Use: Never used  Substance and Sexual Activity   Alcohol use: No    Alcohol/week: 0.0 standard drinks of alcohol    Comment: occasionally   Drug use: No   Sexual activity: Not Currently

## 2022-08-12 ENCOUNTER — Ambulatory Visit: Payer: Medicare Other

## 2022-08-13 ENCOUNTER — Ambulatory Visit: Payer: Medicare Other

## 2022-08-23 ENCOUNTER — Encounter (HOSPITAL_COMMUNITY): Payer: Self-pay

## 2022-08-23 ENCOUNTER — Other Ambulatory Visit: Payer: Self-pay

## 2022-08-23 ENCOUNTER — Ambulatory Visit (HOSPITAL_COMMUNITY)
Admission: RE | Admit: 2022-08-23 | Discharge: 2022-08-23 | Disposition: A | Discharge status: Home / Self Care | Payer: Medicare Other | Source: Ambulatory Visit | Attending: Family Medicine | Admitting: Family Medicine

## 2022-08-23 VITALS — BP 96/64 | HR 62 | Temp 98.6°F | Resp 18

## 2022-08-23 DIAGNOSIS — K625 Hemorrhage of anus and rectum: Secondary | ICD-10-CM | POA: Insufficient documentation

## 2022-08-23 LAB — COMPREHENSIVE METABOLIC PANEL
ALT: 16 U/L (ref 0–44)
AST: 31 U/L (ref 15–41)
Albumin: 4 g/dL (ref 3.5–5.0)
Alkaline Phosphatase: 74 U/L (ref 38–126)
Anion gap: 9 (ref 5–15)
BUN: 6 mg/dL (ref 6–20)
CO2: 26 mmol/L (ref 22–32)
Calcium: 9.5 mg/dL (ref 8.9–10.3)
Chloride: 106 mmol/L (ref 98–111)
Creatinine, Ser: 1.04 mg/dL — ABNORMAL HIGH (ref 0.44–1.00)
GFR, Estimated: >60 mL/min (ref >60)
Glucose, Bld: 81 mg/dL (ref 70–99)
Potassium: 4.8 mmol/L (ref 3.5–5.1)
Sodium: 141 mmol/L (ref 135–145)
Total Bilirubin: 0.7 mg/dL (ref 0.3–1.2)
Total Protein: 7.3 g/dL (ref 6.5–8.1)

## 2022-08-23 LAB — CBC
HCT: 40.1 % (ref 36.0–46.0)
Hemoglobin: 12.5 g/dL (ref 12.0–15.0)
MCH: 26.4 pg (ref 26.0–34.0)
MCHC: 31.2 g/dL (ref 30.0–36.0)
MCV: 84.6 fL (ref 80.0–100.0)
Platelets: 309 10*3/uL (ref 150–400)
RBC: 4.74 MIL/uL (ref 3.87–5.11)
RDW: 13.2 % (ref 11.5–15.5)
WBC: 3.9 10*3/uL — ABNORMAL LOW (ref 4.0–10.5)
nRBC: 0 % (ref 0.0–0.2)

## 2022-08-23 NOTE — Discharge Instructions (Signed)
We have drawn blood to check your blood counts, kidney and liver function numbers, and electrolytes.  Staff will notify you if anything is significantly abnormal  Please proceed to the emergency room if you become dizzy or getting weaker, or if the bleeding becomes more brisk

## 2022-08-23 NOTE — ED Provider Notes (Signed)
[facility]    CSN: [id number] Arrival date & time: 08/23/22  1056      History   Chief Complaint Chief Complaint  Patient presents with   Diarrhea    I don't have diarrhea. I have had blood in my stool for about 5 days. - Entered by patient   Rectal Bleeding    HPI Brittney [name] is a 52 y.o. female.    Diarrhea Rectal Bleeding  Here for blood in her stool that began about 5 days ago.  She has had soft stools about once a day.  The blood has been small in quantity.  Has had a little bit of pain in the rectum but no sensation of any lump or bump there.  She has vomited during the time this started but is not nauseated today.  No blood in the emesis.  No melena.  No dysuria.  Last menstrual cycle was some years ago.  She has called to make an appointment with her gastroenterologist, but that will not be till August.  They apparently recommended her coming to get some blood work done.  Past Medical History:  Diagnosis Date   Abscess    to eyelid   Anxiety    Arthritis    KNEES ELBOWS HIPS SHOULDER FINGERS   Asthma    Back pain    Bipolar disorder (HCC)    Depression    BIPOLAR   Dyspnea    Fibromyalgia    GERD (gastroesophageal reflux disease)    Headache syndrome 12/12/2019   Headache(784.0)    MIGRAINES   History of colonoscopy    History of degenerative disc disease    Hyperlipidemia    Major depressive disorder    Memory difficulties 06/21/2014   Neck pain    OA (osteoarthritis) of knee    Obesity    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   Rheumatoid arthritis (HCC)    Seizures (HCC)    FACIAL SEIZURES LAST 4 DAYS AGO    Patient Active Problem List   Diagnosis Date Noted   Weakness of both legs 08/31/2021   Myalgia, multiple sites 08/31/2021   Back pain with radiculopathy 01/07/2021   Hyperlipidemia    OA (osteoarthritis) of knee    GERD (gastroesophageal reflux disease)    Rheumatoid arthritis (HCC)    Headache syndrome 12/12/2019    Bipolar 1 disorder, depressed, moderate (HCC) 12/14/2018   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   Fibromyalgia 02/03/2015   Tendinitis of both rotator cuffs 02/03/2015   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   Insomnia secondary to chronic pain 11/13/2014   Fatigue due to sleep pattern disturbance 11/13/2014   Memory difficulties 06/21/2014   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   PTSD (post-traumatic stress disorder) 12/13/2013   Bipolar depression (HCC) 12/12/2013   PERICARDIAL EFFUSION 12/23/2008   PSEUDOTUMOR CEREBRI 12/05/2008   UNSPECIFIED PLEURAL EFFUSION 12/05/2008   DYSPNEA 12/05/2008   OBESITY 11/19/2008   Migraine 11/19/2008   ASTHMA 11/19/2008   OTHER SPECIFIED DISORDER OF STOMACH AND DUODENUM 03/11/2008    Past Surgical History:  Procedure Laterality Date   CARDIAC CATHETERIZATION  2010   DILATION AND CURETTAGE OF UTERUS  1994   LAPAROSCOPY N/A 11/21/2012   Procedure: LAPAROSCOPY OPERATIVE REMOVAL OF RIGHT TUBE AND OVARY AND FLUID FROM MASS;  Surgeon: Bing Plume, MD;  Location: [facility];  Service: Gynecology;  Laterality: N/A;  1 1/2hrs OR time   OOPHORECTOMY  OB History   No obstetric history on file.      Home Medications    Prior to Admission medications   Medication Sig Start Date End Date Taking? Authorizing Provider  magnesium oxide (MAG-OX) 400 (240 Mg) MG tablet Take 400 mg by mouth daily.   Yes [provider]  MAGNESIUM PO Take by mouth.    [provider]    Family History Family History  Problem Relation Age of Onset   Cirrhosis Mother    Cancer - Lung Father    Diabetes Father    Neuropathy Father    Migraines Sister    Stroke Sister    Healthy Sister    Healthy Sister    Healthy Sister    Healthy Brother    Healthy Brother    Healthy Brother    Healthy Brother    Healthy Brother    Healthy Brother    Heart disease Maternal Grandmother    Alzheimer's  disease Maternal Grandfather    Cancer - Lung Paternal Grandmother    Breast cancer Daughter 60   Neuropathy Daughter     Social History Social History   Tobacco Use   Smoking status: Former    Types: Cigarettes    Quit date: 02/16/2003    Years since quitting: 19.5   Smokeless tobacco: Never  Vaping Use   Vaping Use: Never used  Substance Use Topics   Alcohol use: No    Alcohol/week: 0.0 standard drinks of alcohol    Comment: occasionally   Drug use: No     Allergies   Carbamazepine and Nsaids   Review of Systems Review of Systems  Gastrointestinal:  Positive for diarrhea and hematochezia.     Physical Exam Triage Vital Signs ED Triage Vitals  Enc Vitals Group     BP 08/23/22 1119 96/64     Pulse Rate 08/23/22 1119 62     Resp 08/23/22 1119 18     Temp 08/23/22 1119 98.6 F (37 C)     Temp src --      SpO2 08/23/22 1119 93 %     Weight --      Height --      Head Circumference --      Peak Flow --      Pain Score 08/23/22 1116 6     Pain Loc --      Pain Edu? --      Excl. in GC? --    No data found.  Updated Vital Signs BP 96/64   Pulse 62   Temp 98.6 F (37 C)   Resp 18   SpO2 93%   Visual Acuity Right Eye Distance:   Left Eye Distance:   Bilateral Distance:    Right Eye Near:   Left Eye Near:    Bilateral Near:     Physical Exam Vitals reviewed.  Constitutional:      General: She is not in acute distress.    Appearance: She is not ill-appearing, toxic-appearing or diaphoretic.  HENT:     Mouth/Throat:     Mouth: Mucous membranes are moist.  Eyes:     Extraocular Movements: Extraocular movements intact.     Conjunctiva/sclera: Conjunctivae normal.     Pupils: Pupils are equal, round, and reactive to light.  Cardiovascular:     Rate and Rhythm: Normal rate and regular rhythm.     Heart sounds: No murmur heard. Pulmonary:     Effort: Pulmonary effort is  normal.     Breath sounds: Normal breath sounds.  Abdominal:     General:  There is no distension.     Palpations: Abdomen is soft.     Tenderness: There is no abdominal tenderness. There is no guarding.  Genitourinary:    Comments: External inspection of the rectum shows no abnormalities--no fissures or hemorrhoids or masses.  Digital rectal exam is done and no masses found on that either. Musculoskeletal:     Cervical back: Neck supple.  Lymphadenopathy:     Cervical: No cervical adenopathy.  Skin:    Coloration: Skin is not pale.  Neurological:     General: No focal deficit present.     Mental Status: She is alert and oriented to person, place, and time.  Psychiatric:        Behavior: Behavior normal.      UC Treatments / Results  Labs (all labs ordered are listed, but only abnormal results are displayed) Labs Reviewed  CBC  COMPREHENSIVE METABOLIC PANEL    EKG   Radiology No results found.  Procedures Procedures (including critical care time)  Medications Ordered in UC Medications - No data to display  Initial Impression / Assessment and Plan / UC Course  I have reviewed the triage vital signs and the nursing notes.  Pertinent labs & imaging results that were available during my care of the patient were reviewed by me and considered in my medical decision making (see chart for details).        Her heart rate is normal and not tachycardic.  Systolic blood pressure of 96 is about 10-15 points lower than her usual here.  She is well-appearing and in no distress in the exam room. CBC and CMP are drawn today, and we will let her know if there is anything that is significantly abnormal or needs treatment. I discussed with her to present to the emergency room if she begins feeling weaker or dizzy.   Final Clinical Impressions(s) / UC Diagnoses   Final diagnoses:  Rectal bleeding     Discharge Instructions      We have drawn blood to check your blood counts, kidney and liver function numbers, and electrolytes.  Staff will  notify you if anything is significantly abnormal  Please proceed to the emergency room if you become dizzy or getting weaker, or if the bleeding becomes more brisk     ED Prescriptions   None    PDMP not reviewed this encounter.   Zenia Resides, MD 08/23/22 [phone]

## 2022-08-23 NOTE — ED Triage Notes (Signed)
Pt rep[orts having blood in stool for 5 days. Pt has also vomited but reports no blood was seen.

## 2022-08-31 ENCOUNTER — Encounter: Payer: Self-pay | Admitting: Obstetrics and Gynecology

## 2022-09-01 ENCOUNTER — Encounter: Payer: Medicare Other | Admitting: Family
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
## 2022-10-19 ENCOUNTER — Ambulatory Visit (HOSPITAL_COMMUNITY): Payer: Medicare Other

## 2022-11-13 ENCOUNTER — Other Ambulatory Visit: Payer: Self-pay | Admitting: Family Medicine

## 2022-11-13 ENCOUNTER — Ambulatory Visit
Admission: RE | Admit: 2022-11-13 | Discharge: 2022-11-13 | Disposition: A | Discharge status: Home / Self Care | Payer: Medicare Other | Source: Ambulatory Visit | Attending: Family Medicine | Admitting: Family Medicine

## 2022-11-13 DIAGNOSIS — Z1231 Encounter for screening mammogram for malignant neoplasm of breast: Secondary | ICD-10-CM

## 2023-01-25 ENCOUNTER — Ambulatory Visit (INDEPENDENT_AMBULATORY_CARE_PROVIDER_SITE_OTHER): Payer: Medicare Other | Admitting: Family

## 2023-01-25 ENCOUNTER — Encounter: Payer: Self-pay | Admitting: Family

## 2023-01-25 VITALS — BP 130/82 | HR 73 | Temp 97.1°F | Resp 20 | Ht 68.0 in | Wt 222.8 lb

## 2023-01-25 DIAGNOSIS — J0101 Acute recurrent maxillary sinusitis: Secondary | ICD-10-CM

## 2023-01-25 DIAGNOSIS — R051 Acute cough: Secondary | ICD-10-CM | POA: Diagnosis not present

## 2023-01-25 DIAGNOSIS — H6122 Impacted cerumen, left ear: Secondary | ICD-10-CM

## 2023-01-25 LAB — POCT INFLUENZA A/B
Influenza A, POC: NEGATIVE
Influenza B, POC: NEGATIVE

## 2023-01-25 LAB — POC COVID19 BINAXNOW: SARS Coronavirus 2 Ag: NEGATIVE

## 2023-01-25 MED ORDER — AZITHROMYCIN 250 MG PO TABS: ORAL_TABLET | ORAL | 0 refills | Status: DC

## 2023-01-25 NOTE — Patient Instructions (Signed)
-   Notify provider if symptoms worsen or fail to improve  °

## 2023-01-25 NOTE — Progress Notes (Unsigned)
Provider: Richarda Blade FNP-C  Brittney [name], Brittney Citrin, NP  Patient Care Team: Brittney [name], Brittney Citrin, NP as PCP - General (Family Medicine) Brittney Camps, MD as Consulting Physician (Ophthalmology) Brittney Ohs, NP as Nurse Practitioner (Nurse Practitioner) Brittney Brod, MD as Consulting Physician (Internal Medicine)  Extended Emergency Contact Information Primary Emergency Contact: SURGEON,Brittney [name] Mobile Phone: [phone] Relation: Son Secondary Emergency Contact: Weems,(MPOA)Brittney [name] Home Phone: [phone] Relation: Other  Code Status: Full Code  Goals of care: Advanced Directive information    01/25/2023    1:27 PM  Advanced Directives  Does Patient Have a Medical Advance Directive? Yes  Type of Estate agent of [place];Living will  Does patient want to make changes to medical advance directive? No - Patient declined  Copy of Healthcare Power of Attorney in Chart? Yes - validated most recent copy scanned in chart (See row information)     Chief Complaint  Patient presents with  . Acute Visit    Cold symptoms.    HPI:  Pt is a 52 y.o. female seen today for an acute visit for evaluation of cold symptoms x 1 month.states flew to [place]  Has had some generalized weakness.No appetite but forces herself to eat.  Has been having runny nose and cough.took over the counter Psudofed.    Her grandchildren have been sick.Also travelled to [place] in the plane but forgot to wear a mask going but wore when coming back.    Past Medical History:  Diagnosis Date  . Abscess    to eyelid  . Anxiety   . Arthritis    KNEES ELBOWS HIPS SHOULDER FINGERS  . Asthma   . Back pain   . Bipolar disorder (HCC)   . Depression    BIPOLAR  . Dyspnea   . Fibromyalgia   . GERD (gastroesophageal reflux disease)   . Headache syndrome 12/12/2019  . Headache(784.0)    MIGRAINES  . History of colonoscopy   . History of degenerative disc disease   . Hyperlipidemia   .  Major depressive disorder   . Memory difficulties 06/21/2014  . Neck pain   . OA (osteoarthritis) of knee   . Obesity   . 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  . Rheumatoid arthritis (HCC)   . Seizures (HCC)    FACIAL SEIZURES LAST 4 DAYS AGO   Past Surgical History:  Procedure Laterality Date  . CARDIAC CATHETERIZATION  2010  . DILATION AND CURETTAGE OF UTERUS  1994  . LAPAROSCOPY N/A 11/21/2012   Procedure: LAPAROSCOPY OPERATIVE REMOVAL OF RIGHT TUBE AND OVARY AND FLUID FROM MASS;  Surgeon: Bing Plume, MD;  Location: [facility];  Service: Gynecology;  Laterality: N/A;  1 1/2hrs OR time  . OOPHORECTOMY      Allergies  Allergen Reactions  . Carbamazepine Rash  . Nsaids Swelling     Pt says she can take ibuprofen    Outpatient Encounter Medications as of 01/25/2023  Medication Sig  . magnesium oxide (MAG-OX) 400 (240 Mg) MG tablet Take 400 mg by mouth daily.  Marland Kitchen MAGNESIUM PO Take by mouth.   No facility-administered encounter medications on file as of 01/25/2023.    Review of Systems  Immunization History  Administered Date(s) Administered  . Influenza Split 12/03/2010  . Janssen (J&J) SARS-COV-2 Vaccination 05/24/2019   Pertinent  Health Maintenance Due  Topic Date Due  . INFLUENZA VACCINE  09/16/2022  . MAMMOGRAM  11/12/2024  . Colonoscopy  01/25/2029      10/22/2021  3:32 PM 12/31/2021    2:33 PM 02/28/2022   11:52 AM 02/28/2022   12:51 PM 01/25/2023    1:26 PM  Fall Risk  Falls in the past year?     0  Was there an injury with Fall?     0  Fall Risk Category Calculator     0  (RETIRED) Patient Fall Risk Level Low fall risk Low fall risk High fall risk Moderate fall risk    Functional Status Survey:    Vitals:   01/25/23 1329  BP: 130/82  Pulse: 73  Resp: 20  Temp: (!) 97.1 F (36.2 C)  SpO2: 96%  Weight: 222 lb 12.8 oz (101.1 kg)  Height: 5\' 8"  (1.727 m)   Body mass index is 33.88 kg/m. Physical Exam  Labs reviewed: Recent Labs     02/28/22 1245 08/23/22 1159  NA 137 141  K 4.2 4.8  CL 104 106  CO2 25 26  GLUCOSE 100* 81  BUN 8 6  CREATININE 0.73 1.04*  CALCIUM 9.4 9.5  MG 2.1  --    Recent Labs    02/28/22 1245 08/23/22 1159  AST 31 31  ALT 22 16  ALKPHOS 81 74  BILITOT 0.5 0.7  PROT 7.4 7.3  ALBUMIN 3.9 4.0   Recent Labs    02/28/22 1245 08/23/22 1159  WBC 5.1 3.9*  NEUTROABS 2.1  --   HGB 12.5 12.5  HCT 38.7 40.1  MCV 84.7 84.6  PLT 353 309   Lab Results  Component Value Date   TSH 0.500 12/14/2018   Lab Results  Component Value Date   HGBA1C 6.0 (H) 12/14/2018   Lab Results  Component Value Date   CHOL 200 12/14/2018   HDL 58 12/14/2018   LDLCALC 131 (H) 12/14/2018   TRIG 57 12/14/2018   CHOLHDL 3.4 12/14/2018    Significant Diagnostic Results in last 30 days:  No results found.  Assessment/Plan There are no diagnoses linked to this encounter.   Family/ staff Communication: Reviewed plan of care with patient  Labs/tests ordered: None   Next Appointment:   Caesar Bookman, NP

## 2023-04-19 ENCOUNTER — Encounter: Payer: Self-pay | Admitting: Family

## 2023-04-19 ENCOUNTER — Ambulatory Visit (INDEPENDENT_AMBULATORY_CARE_PROVIDER_SITE_OTHER): Payer: Medicare Other | Admitting: Family

## 2023-04-19 VITALS — BP 118/70 | HR 76 | Temp 97.5°F | Resp 21 | Ht 68.0 in | Wt 236.6 lb

## 2023-04-19 DIAGNOSIS — G8929 Other chronic pain: Secondary | ICD-10-CM | POA: Diagnosis not present

## 2023-04-19 DIAGNOSIS — M05752 Rheumatoid arthritis with rheumatoid factor of left hip without organ or systems involvement: Secondary | ICD-10-CM

## 2023-04-19 DIAGNOSIS — M25512 Pain in left shoulder: Secondary | ICD-10-CM | POA: Diagnosis not present

## 2023-04-19 DIAGNOSIS — M170 Bilateral primary osteoarthritis of knee: Secondary | ICD-10-CM

## 2023-04-19 DIAGNOSIS — M05751 Rheumatoid arthritis with rheumatoid factor of right hip without organ or systems involvement: Secondary | ICD-10-CM

## 2023-04-19 DIAGNOSIS — J069 Acute upper respiratory infection, unspecified: Secondary | ICD-10-CM

## 2023-04-19 LAB — POCT INFLUENZA A/B
Influenza A, POC: NEGATIVE
Influenza B, POC: NEGATIVE

## 2023-04-19 LAB — POC COVID19 BINAXNOW: SARS Coronavirus 2 Ag: NEGATIVE

## 2023-04-19 NOTE — Progress Notes (Signed)
 Provider: Richarda Blade FNP-C  Burns Timson, Donalee Citrin, NP  Patient Care Team: Flavia Bruss, Donalee Citrin, NP as PCP - General (Family Medicine) Aura Camps, MD as Consulting Physician (Ophthalmology) Karolee Ohs, NP as Nurse Practitioner (Nurse Practitioner) Annita Brod, MD as Consulting Physician (Internal Medicine)  Extended Emergency Contact Information Primary Emergency Contact: SURGEON,JEFFREY Mobile Phone: [phone] Relation: Son Secondary Emergency Contact: Weems,(MPOA)Emma Home Phone: [phone] Relation: Other  Code Status:  Full Code  Goals of care: Advanced Directive information    04/19/2023    2:33 PM  Advanced Directives  Does Patient Have a Medical Advance Directive? Yes  Type of Estate agent of [address];Living will  Does patient want to make changes to medical advance directive? No - Patient declined  Copy of Healthcare Power of Attorney in Chart? No - copy requested     Chief Complaint  Patient presents with   Acute Visit    Pt c/o pain in knees,hips,and left shoulder.    Discussed the use of AI scribe software for clinical note transcription with the patient, who gave verbal consent to proceed.  History of Present Illness   Brittney [name] is a 53 year old female with rheumatoid arthritis who presents with joint pain in the knees, hips, and left shoulder.  She has been experiencing joint pain in her knees, hips, and left shoulder for the past two to three weeks. The pain is persistent, with a severity of 6 out of 10 in the hips, 8 out of 10 in the knees, and 10 out of 10 in the left shoulder. She has been using Tylenol intermittently for pain relief, but its effectiveness is limited due to concurrent use of Sudafed for a cold, which prevents her from taking other pain medications. The pain persists despite medication.  She has a history of rheumatoid arthritis and recalls a previous labral tear in her left shoulder, for which she  had an x-ray six months ago showing mild to moderate joint degeneration. She has not had recent imaging for her knees or hips. Her shoulder pain has worsened, impacting her ability to exercise. She has not seen an orthopedic specialist in a long time and has not had any surgeries or cortisone injections for her joint issues. She is concerned about the potential exacerbation of arthritis with cortisone injections. She has not seen a rheumatologist since 2021.  She reports gaining weight, which she believes contributes to her joint pain. She has attempted to return to the gym but is cautious about exacerbating her symptoms. She is currently taking magnesium supplements and has experienced weakness in her hands, with occasional numbness and tingling.  She is also experiencing symptoms of a cold, including congestion and diarrhea, which began after attending large gatherings. No fever, chills, or cough, but she reports sinus tenderness.   Past Medical History:  Diagnosis Date   Abscess    to eyelid   Anxiety    Arthritis    KNEES ELBOWS HIPS SHOULDER FINGERS   Asthma    Back pain    Bipolar disorder (HCC)    Depression    BIPOLAR   Dyspnea    Fibromyalgia    GERD (gastroesophageal reflux disease)    Headache syndrome 12/12/2019   Headache(784.0)    MIGRAINES   History of colonoscopy    History of degenerative disc disease    Hyperlipidemia    Major depressive disorder    Memory difficulties 06/21/2014   Neck pain  OA (osteoarthritis) of knee    Obesity    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   Rheumatoid arthritis (HCC)    Seizures (HCC)    FACIAL SEIZURES LAST 4 DAYS AGO   Past Surgical History:  Procedure Laterality Date   CARDIAC CATHETERIZATION  2010   DILATION AND CURETTAGE OF UTERUS  1994   LAPAROSCOPY N/A 11/21/2012   Procedure: LAPAROSCOPY OPERATIVE REMOVAL OF RIGHT TUBE AND OVARY AND FLUID FROM MASS;  Surgeon: Bing Plume, MD;  Location: [facility];  Service:  Gynecology;  Laterality: N/A;  1 1/2hrs OR time   OOPHORECTOMY      Allergies  Allergen Reactions   Carbamazepine Rash   Nsaids Swelling     Pt says she can take ibuprofen    Outpatient Encounter Medications as of 04/19/2023  Medication Sig   magnesium oxide (MAG-OX) 400 (240 Mg) MG tablet Take 400 mg by mouth daily.   MAGNESIUM PO Take by mouth.   [DISCONTINUED] azithromycin (ZITHROMAX) 250 MG tablet Take 2 tablets (500 mg ) by mouth x 1 dose then one tablet ( 250 mg ) by mouth daily x 4 days (Patient not taking: Reported on 04/19/2023)   No facility-administered encounter medications on file as of 04/19/2023.    Review of Systems  Constitutional:  Negative for appetite change, chills, fatigue, fever and unexpected weight change.  HENT:  Negative for congestion, dental problem, ear discharge, ear pain, facial swelling, hearing loss, nosebleeds, postnasal drip, rhinorrhea, sinus pressure, sinus pain, sneezing, sore throat, tinnitus and trouble swallowing.   Eyes:  Negative for pain, discharge, redness, itching and visual disturbance.  Respiratory:  Negative for cough, chest tightness, shortness of breath and wheezing.   Cardiovascular:  Negative for chest pain, palpitations and leg swelling.  Gastrointestinal:  Negative for abdominal distention, abdominal pain, blood in stool, constipation, diarrhea, nausea and vomiting.  Genitourinary:  Negative for difficulty urinating, dysuria, flank pain, frequency and urgency.  Musculoskeletal:  Positive for arthralgias. Negative for back pain, gait problem, joint swelling, myalgias, neck pain and neck stiffness.  Skin:  Negative for color change, pallor, rash and wound.  Neurological:  Negative for dizziness, syncope, speech difficulty, weakness, light-headedness, numbness and headaches.  Psychiatric/Behavioral:  Negative for agitation, behavioral problems, confusion, hallucinations and sleep disturbance. The patient is not nervous/anxious.      Immunization History  Administered Date(s) Administered   Influenza Split 12/03/2010   Janssen (J&J) SARS-COV-2 Vaccination 05/24/2019   Pertinent  Health Maintenance Due  Topic Date Due   INFLUENZA VACCINE  09/16/2022   MAMMOGRAM  11/12/2024   Colonoscopy  01/25/2029      12/31/2021    2:33 PM 02/28/2022   11:52 AM 02/28/2022   12:51 PM 01/25/2023    1:26 PM 04/19/2023    2:32 PM  Fall Risk  Falls in the past year?    0 1  Was there an injury with Fall?    0 0  Fall Risk Category Calculator    0 1  (RETIRED) Patient Fall Risk Level Low fall risk High fall risk Moderate fall risk    Patient at Risk for Falls Due to     No Fall Risks  Fall risk Follow up     Falls evaluation completed;Education provided;Falls prevention discussed   Functional Status Survey:    Vitals:   04/19/23 1434  BP: 118/70  Pulse: 76  Resp: (!) 21  Temp: (!) 97.5 F (36.4 C)  SpO2: 96%  Weight: 236 lb 9.6  oz (107.3 kg)  Height: 5\' 8"  (1.727 m)   Body mass index is 35.97 kg/m. Physical Exam VITALS: T- 97.5, P- 70, BP- 112/70, SaO2- 96% MEASUREMENTS: Weight- 236. GENERAL: Alert, cooperative, well developed, no acute distress. HEENT: Normocephalic, normal oropharynx, moist mucous membranes, tympanic membranes normal bilaterally, nasal mucosa normal, pharynx normal, sinus tenderness present. CHEST: Clear to auscultation bilaterally, no wheezes, rhonchi, or crackles. CARDIOVASCULAR: Normal heart rate and rhythm, S1 and S2 normal without murmurs. ABDOMEN: Soft, non-tender, non-distended, without organomegaly, normal bowel sounds. EXTREMITIES: No cyanosis or edema. MUSCULOSKELETAL: Left hip pain on movement, left shoulder pain on movement, knees normal. NEUROLOGICAL: Cranial nerves grossly intact, moves all extremities without gross motor or sensory deficit. PSYCHIATRY/BEHAVIORAL: Mood stable   Labs reviewed: Recent Labs    08/23/22 1159  NA 141  K 4.8  CL 106  CO2 26  GLUCOSE 81  BUN 6   CREATININE 1.04*  CALCIUM 9.5   Recent Labs    08/23/22 1159  AST 31  ALT 16  ALKPHOS 74  BILITOT 0.7  PROT 7.3  ALBUMIN 4.0   Recent Labs    08/23/22 1159  WBC 3.9*  HGB 12.5  HCT 40.1  MCV 84.6  PLT 309   Lab Results  Component Value Date   TSH 0.500 12/14/2018   Lab Results  Component Value Date   HGBA1C 6.0 (H) 12/14/2018   Lab Results  Component Value Date   CHOL 200 12/14/2018   HDL 58 12/14/2018   LDLCALC 131 (H) 12/14/2018   TRIG 57 12/14/2018   CHOLHDL 3.4 12/14/2018    Significant Diagnostic Results in last 30 days:  No results found.  Assessment/Plan  Arthralgia secondary to Rheumatoid Arthritis Experiencing pain in both knees, both hips, and the left shoulder, with the shoulder pain being the most severe. Pain has persisted for two to three weeks. Rheumatoid arthritis likely contributing to joint pain. Has not consulted a rheumatologist since 2021 and lacks recent imaging for knees or hips. Left shoulder x-ray from six months ago showed mild to moderate joint degeneration. Prefers physical therapy over cortisone injections due to concerns about exacerbating arthritis. Cortisone injections may benefit some patients, but outcomes vary. Prefers to consult with an orthopedic specialist before deciding on injections. - Refer to orthopedic specialist for evaluation of shoulder, knees, and hips - Refer to physical therapy for shoulder rehabilitation - Refer to rheumatologist for management of rheumatoid arthritis - Request records from previous rheumatologist - Discuss potential benefits and risks of cortisone injections with orthopedic specialist  Upper Respiratory Infection Experiencing congestion and diarrhea, with no fever or chills. Taking Sudafed for symptom relief. Recent exposure to large groups may have contributed to infection. Plans to rule out COVID-19 and influenza. - Swab for COVID-19 and influenza results were negative  - Advise taking zinc  and vitamin C supplements - Notify provider if symptoms worsen or not resolved   General Health Maintenance Overdue for a physical examination and lab work, last completed in January 2024. Concerned about weight gain and its impact on health. Encouraged to engage in physical therapy to improve fitness and manage weight. - Schedule physical examination and lab work - Encourage physical therapy to improve fitness and manage weight   Family/ staff Communication: Reviewed plan of care with patient verbalized understanding   Labs/tests ordered: None   Next Appointment: Return in about 1 month (around 05/20/2023) for annual Physical examination.   Total time: 25 minutes. Greater than 50% of total time spent  doing patient education regarding URI,chronic pain,health maintenance including symptom/medication management.   Caesar Bookman, NP

## 2023-04-26 ENCOUNTER — Other Ambulatory Visit (INDEPENDENT_AMBULATORY_CARE_PROVIDER_SITE_OTHER): Payer: Self-pay

## 2023-04-26 ENCOUNTER — Encounter: Payer: Self-pay | Admitting: Physician Assistant

## 2023-04-26 ENCOUNTER — Ambulatory Visit (INDEPENDENT_AMBULATORY_CARE_PROVIDER_SITE_OTHER): Admitting: Physician Assistant

## 2023-04-26 DIAGNOSIS — M25561 Pain in right knee: Secondary | ICD-10-CM | POA: Diagnosis not present

## 2023-04-26 DIAGNOSIS — G8929 Other chronic pain: Secondary | ICD-10-CM | POA: Diagnosis not present

## 2023-04-26 DIAGNOSIS — M170 Bilateral primary osteoarthritis of knee: Secondary | ICD-10-CM

## 2023-04-26 DIAGNOSIS — M25562 Pain in left knee: Secondary | ICD-10-CM

## 2023-04-26 DIAGNOSIS — M19012 Primary osteoarthritis, left shoulder: Secondary | ICD-10-CM

## 2023-04-26 MED ORDER — LIDOCAINE HCL 1 % IJ SOLN
3.0000 mL | INTRAMUSCULAR | Status: AC | PRN
  Administered 2023-04-26: 3 mL

## 2023-04-26 MED ORDER — METHYLPREDNISOLONE ACETATE 40 MG/ML IJ SUSP
13.3300 mg | INTRAMUSCULAR | Status: AC | PRN
  Administered 2023-04-26: 13.33 mg via INTRA_ARTICULAR

## 2023-04-26 MED ORDER — BUPIVACAINE HCL 0.25 % IJ SOLN
0.6600 mL | INTRAMUSCULAR | Status: AC | PRN
  Administered 2023-04-26: .66 mL via INTRA_ARTICULAR

## 2023-04-26 NOTE — Progress Notes (Signed)
 Office Visit Note   Patient: Brittney [name]           Date of Birth: 06-29-1970           MRN: [id number] Visit Date: 04/26/2023              Requested by: Caesar Bookman, NP [address] [place],  [place] [address] PCP: Caesar Bookman, NP   Assessment & Plan: Visit Diagnoses:  1. Bilateral primary osteoarthritis of knee   2. Primary osteoarthritis, left shoulder     Plan: Impression is left shoulder glenohumeral osteoarthritis and bilateral knee osteoarthritis most symptomatic from the patellofemoral compartment.  We have discussed cortisone injections to these areas.  Follow-Up Instructions: Return if symptoms worsen or fail to improve.   Orders:  Orders Placed This Encounter  Procedures   XR KNEE 3 VIEW LEFT   XR KNEE 3 VIEW RIGHT   No orders of the defined types were placed in this encounter.     Procedures: Large Joint Inj: bilateral knee on 04/26/2023 11:07 AM Indications: pain Details: 22 G needle, anterolateral approach Medications (Right): 0.66 mL bupivacaine 0.25 %; 3 mL lidocaine 1 %; 13.33 mg methylPREDNISolone acetate 40 MG/ML Medications (Left): 0.66 mL bupivacaine 0.25 %; 3 mL lidocaine 1 %; 13.33 mg methylPREDNISolone acetate 40 MG/ML      Clinical Data: No additional findings.   Subjective: Chief Complaint  Patient presents with   Left Knee - Pain   Right Knee - Pain   Left Shoulder - Pain    HPI patient is a pleasant 53 year old female who comes in today with recurrent left shoulder pain as well as new onset bilateral knee pain left greater than right.  She was seen in our office in June of last year for her left shoulder.  At that time, she thought she was referred to Dr. Shon Baton but she was never contacted.  She has had intermittent pain since which is actually worsened over the past few months.  All of her pain is to the anterior aspect.  Symptoms are worse when she is exercising.  She has been taking Tylenol which takes the edge off.  In  regards to her knees, she has had pain for the past few months.  The pain she has is deep behind the kneecaps.  Symptoms are worse with activity such as stair climbing and squatting.  She has been taking Tylenol without significant relief.  No previous cortisone injection to either knee.  Review of Systems as detailed in HPI.  All others reviewed and are negative.   Objective: Vital Signs: There were no vitals taken for this visit.  Physical Exam well-developed well-nourished female in no acute distress.  Alert and oriented x 3.  Ortho Exam bilateral knee exam: Range of motion from 0 to 120 degrees.  No joint line tenderness.  Marked patellofemoral crepitus.  Ligaments are stable.  She is neurovascularly intact distally.  Left shoulder exam: Full active forward flexion, abduction.  External rotation to 80 degrees.  She can internally rotate to L5.  She has pain with speeds and O'Brien's testing.  She is neurovascularly intact distally.  Specialty Comments:  No specialty comments available.  Imaging: XR KNEE 3 VIEW LEFT Result Date: 04/26/2023 X-rays demonstrate moderate medial and patellofemoral degenerative changes  XR KNEE 3 VIEW RIGHT Result Date: 04/26/2023 X-rays demonstrate moderate medial and patellofemoral degenerative changes    PMFS History: Patient Active Problem List   Diagnosis Date Noted  Weakness of both legs 08/31/2021   Myalgia, multiple sites 08/31/2021   Back pain with radiculopathy 01/07/2021   Hyperlipidemia    OA (osteoarthritis) of knee    GERD (gastroesophageal reflux disease)    Rheumatoid arthritis (HCC)    Headache syndrome 12/12/2019   Bipolar 1 disorder, depressed, moderate (HCC) 12/14/2018   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   Fibromyalgia 02/03/2015   Tendinitis of both rotator cuffs 02/03/2015   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   Insomnia secondary to chronic pain 11/13/2014   Fatigue due to  sleep pattern disturbance 11/13/2014   Memory difficulties 06/21/2014   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   PTSD (post-traumatic stress disorder) 12/13/2013   Bipolar depression (HCC) 12/12/2013   PERICARDIAL EFFUSION 12/23/2008   PSEUDOTUMOR CEREBRI 12/05/2008   UNSPECIFIED PLEURAL EFFUSION 12/05/2008   DYSPNEA 12/05/2008   OBESITY 11/19/2008   Migraine 11/19/2008   ASTHMA 11/19/2008   OTHER SPECIFIED DISORDER OF STOMACH AND DUODENUM 03/11/2008   Past Medical History:  Diagnosis Date   Abscess    to eyelid   Anxiety    Arthritis    KNEES ELBOWS HIPS SHOULDER FINGERS   Asthma    Back pain    Bipolar disorder (HCC)    Depression    BIPOLAR   Dyspnea    Fibromyalgia    GERD (gastroesophageal reflux disease)    Headache syndrome 12/12/2019   Headache(784.0)    MIGRAINES   History of colonoscopy    History of degenerative disc disease    Hyperlipidemia    Major depressive disorder    Memory difficulties 06/21/2014   Neck pain    OA (osteoarthritis) of knee    Obesity    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   Rheumatoid arthritis (HCC)    Seizures (HCC)    FACIAL SEIZURES LAST 4 DAYS AGO    Family History  Problem Relation Age of Onset   Cirrhosis Mother    Cancer - Lung Father    Diabetes Father    Neuropathy Father    Migraines Sister    Stroke Sister    Healthy Sister    Healthy Sister    Healthy Sister    Healthy Brother    Healthy Brother    Healthy Brother    Healthy Brother    Healthy Brother    Healthy Brother    Heart disease Maternal Grandmother    Alzheimer's disease Maternal Grandfather    Cancer - Lung Paternal Grandmother    Breast cancer Daughter 52   Neuropathy Daughter     Past Surgical History:  Procedure Laterality Date   CARDIAC CATHETERIZATION  2010   DILATION AND CURETTAGE OF UTERUS  1994   LAPAROSCOPY N/A 11/21/2012   Procedure: LAPAROSCOPY OPERATIVE REMOVAL OF RIGHT TUBE AND OVARY AND FLUID FROM MASS;  Surgeon: Bing Plume, MD;  Location: [facility];  Service: Gynecology;  Laterality: N/A;  1 1/2hrs OR time   OOPHORECTOMY     Social History   Occupational History   Not on file  Tobacco Use   Smoking status: Former    Current packs/day: 0.00    Types: Cigarettes    Quit date: 02/16/2003    Years since quitting: 20.2   Smokeless tobacco: Never  Vaping Use   Vaping status: Never Used  Substance and Sexual Activity   Alcohol use: No    Alcohol/week: 0.0 standard drinks of alcohol    Comment: occasionally   Drug  use: No   Sexual activity: Not Currently

## 2023-04-26 NOTE — Therapy (Signed)
 OUTPATIENT PHYSICAL THERAPY SHOULDER EVALUATION   Patient Name: Brittney [name] MRN: [id number] DOB:[birth year], 53 y.o., female Today's Date: 04/28/2023  END OF SESSION:  PT End of Session - 04/28/23 0606     Visit Number 1    Number of Visits 17    Date for PT Re-Evaluation 07/01/23    Authorization Type [employer]UE [employer]ICARE; MEDICAID OF [place]    PT Start Time 1505    PT Stop Time 1550    PT Time Calculation (min) 45 min    Activity Tolerance Patient tolerated treatment well    Behavior During Therapy WFL for tasks assessed/performed             Past Medical History:  Diagnosis Date   Abscess    to eyelid   Anxiety    Arthritis    KNEES ELBOWS HIPS SHOULDER FINGERS   Asthma    Back pain    Bipolar disorder (HCC)    Depression    BIPOLAR   Dyspnea    Fibromyalgia    GERD (gastroesophageal reflux disease)    Headache syndrome 12/12/2019   Headache(784.0)    MIGRAINES   History of colonoscopy    History of degenerative disc disease    Hyperlipidemia    Major depressive disorder    Memory difficulties 06/21/2014   Neck pain    OA (osteoarthritis) of knee    Obesity    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   Rheumatoid arthritis (HCC)    Seizures (HCC)    FACIAL SEIZURES LAST 4 DAYS AGO   Past Surgical History:  Procedure Laterality Date   CARDIAC CATHETERIZATION  2010   DILATION AND CURETTAGE OF UTERUS  1994   LAPAROSCOPY N/A 11/21/2012   Procedure: LAPAROSCOPY OPERATIVE REMOVAL OF RIGHT TUBE AND OVARY AND FLUID FROM MASS;  Surgeon: Bing Plume, MD;  Location: [facility];  Service: Gynecology;  Laterality: N/A;  1 1/2hrs OR time   OOPHORECTOMY     Patient Active Problem List   Diagnosis Date Noted   Weakness of both legs 08/31/2021   Myalgia, multiple sites 08/31/2021   Back pain with radiculopathy 01/07/2021   Hyperlipidemia    OA (osteoarthritis) of knee    GERD (gastroesophageal reflux disease)    Rheumatoid arthritis (HCC)    Headache syndrome  12/12/2019   Bipolar 1 disorder, depressed, moderate (HCC) 12/14/2018   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   Fibromyalgia 02/03/2015   Tendinitis of both rotator cuffs 02/03/2015   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   Insomnia secondary to chronic pain 11/13/2014   Fatigue due to sleep pattern disturbance 11/13/2014   Memory difficulties 06/21/2014   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   PTSD (post-traumatic stress disorder) 12/13/2013   Bipolar depression (HCC) 12/12/2013   PERICARDIAL EFFUSION 12/23/2008   PSEUDOTUMOR CEREBRI 12/05/2008   UNSPECIFIED PLEURAL EFFUSION 12/05/2008   DYSPNEA 12/05/2008   OBESITY 11/19/2008   Migraine 11/19/2008   ASTHMA 11/19/2008   OTHER SPECIFIED DISORDER OF STOMACH AND DUODENUM 03/11/2008    PCP: Caesar Bookman, NP   REFERRING PROVIDER: Ngetich, Donalee Citrin, NP   REFERRING DIAG:  [phone] (ICD-10-CM) - Chronic left shoulder pain  M17.0 (ICD-10-CM) - Primary osteoarthritis of both knees  M05.751,M05.752 (ICD-10-CM) - 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 Pt reports her L shoulder has been hurting since January when she started exercising more often with both aquatics and a trainer. Currently, she is not working with the trainer and she finds water exercises help to decrease  the L shoulder pain. In 2019, she states she had a labral tear. Pt notes she is to receive a cortisone injection for the L shoulder on Friday. Pt received cortisone injections of both knees yesterday and they feel much better. Overall, she reports her physical current issues are debilitating.  Hand dominance: Right  PERTINENT HISTORY: Obesity, fibromyalgia, headache syndrome, DDD, neck pain  PAIN:  Are you having pain? Yes: NPRS scale: 9/10. Pain range the week prior to start of PT:7-9/10 Pain location: Anterior L shoulder Pain description: burning, sharp Aggravating factors: Use of it Relieving factors: Water therapy  PRECAUTIONS: None  RED FLAGS: None   WEIGHT BEARING RESTRICTIONS: No  FALLS:  Has patient fallen in last 6 months? Yes. Number of falls 1 Sharp pain with feet and dropped  LIVING ENVIRONMENT: Lives with: lives with their family Lives in: House/apartment Able to access home  OCCUPATION: Geophysicist/field seismologist  PLOF: Independent  PATIENT GOALS:Pain relief, improved function  NEXT MD VISIT:   OBJECTIVE:  Note: Objective measures were completed at Evaluation unless otherwise noted.  DIAGNOSTIC FINDINGS:  Xray R shoulder 07/16/22 IMPRESSION: Mild to moderate glenohumeral joint degeneration. No acute osseous abnormality identified about the left shoulder.  Xray R knee 04/26/23 X-rays demonstrate moderate medial and patellofemoral degenerative changes  Xray L knee 04/26/23 X-rays demonstrate moderate medial and patellofemoral degenerative changes   PATIENT SURVEYS:  Quick Dash 47/55=89% disability  COGNITION: Overall cognitive status: Within functional limits for tasks assessed     SENSATION: WFL  POSTURE: Forward head and rounded shoulder  UPPER EXTREMITY ROM:   All L shoulder AROMs were painful with appropriate ROM. Pt completes at a slow pace Active ROM Right eval Left eval  Shoulder flexion  130  Shoulder extension    Shoulder abduction   130  Shoulder adduction    Shoulder internal rotation  T9  Shoulder external rotation  T4  Elbow flexion    Elbow extension    Wrist flexion  Wrist extension    Wrist ulnar deviation    Wrist radial deviation    Wrist pronation    Wrist supination    (Blank rows = not tested)  UPPER EXTREMITY MMT:  L shoulder strength was 4 to 4+/5 in a neutral position without significant increase in pain MMT Right eval Left eval  Shoulder flexion    Shoulder extension    Shoulder abduction    Shoulder adduction    Shoulder internal rotation    Shoulder external rotation    Middle trapezius    Lower trapezius    Elbow flexion    Elbow extension    Wrist flexion    Wrist extension    Wrist ulnar deviation    Wrist radial deviation    Wrist pronation    Wrist supination    Grip strength (lbs)    (Blank rows = not tested)  SHOULDER SPECIAL TESTS: Impingement tests: Hawkins/Kennedy impingement test: negative SLAP lesions: Crank test: negative and Biceps load test: negative Rotator cuff assessment: Empty can test: negative and Full can test: negative Biceps assessment: Speed's test: negative  JOINT MOBILITY TESTING:  Stable GH jt  PALPATION:  TTP to the L upper trap                                                                                                                             TREATMENT DATE:  [facility] Adult PT Treatment:                                                DATE: 04/27/23 Therapeutic Exercise: Developed, instructed in, and pt completed therex as noted in HEP  Self Care: Recommendations for sleeping position c pillow support  PATIENT EDUCATION: Education details: Eval findings, POC, HEP Person educated: Patient Education method: Explanation, Demonstration, Tactile cues, and Verbal cues Education comprehension: verbalized understanding, returned demonstration, verbal cues required, and tactile cues required  HOME EXERCISE PROGRAM: Access Code:  [email or web address] URL: [email or web address] Date: 04/27/2023 Prepared by: Joellyn Rued  Exercises - Flexion-Extension Shoulder Pendulum with Table Support  - 3 x daily - 7 x weekly - 1 sets - 20 reps  ASSESSMENT:  CLINICAL IMPRESSION: Patient is a 53 y.o. 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   OBJECTIVE IMPAIRMENTS: decreased activity tolerance, decreased balance, difficulty walking, decreased strength, postural dysfunction, obesity, and pain.   ACTIVITY LIMITATIONS: carrying, lifting, bending, sitting, standing, squatting, sleeping, stairs, transfers, bathing, toileting, dressing, reach over head, locomotion level, and caring for others  PARTICIPATION LIMITATIONS: meal prep, cleaning, laundry, driving, shopping, community activity, and occupation  PERSONAL FACTORS: Fitness,  Past/current experiences, Time since onset of injury/illness/exacerbation, and 3+ comorbidities:    Obesity, fibromyalgia, headache syndrome, DDD, neck pain are also affecting patient's functional outcome.   REHAB POTENTIAL: Good  CLINICAL DECISION MAKING: Evolving/moderate complexity  EVALUATION COMPLEXITY: Moderate   GOALS:  SHORT TERM GOALS: Target date: 05/12/23  Pt will be Ind in an initial HEP  Baseline:started Goal status: INITIAL  2.  Pt will voice understanding of measures to assist in pain reduction  Baseline:  Goal status: INITIAL   LONG TERM GOALS: Target date: 07/01/23  Pt will be Ind in a final HEP to maintain achieved LOF  Baseline: started Goal status: INITIAL  2.  Pt will report 50% or greater improvement for her L shoulder for improved function and QOL. Baseline: 7-9/10 Goal status: INITIAL  3.  Pt will  demonstrate the ability to lift 3# OH with good quality of movement for functional use with ADLs  and home activites Baseline: Unable Goal status: INITIAL  4.  Pt's Quick Dash score will improve by the MCID to 73% as indication of improved L shoulder function  Baseline: 89% Goal status: INITIAL  PLAN:  PT FREQUENCY: 2x/week  PT DURATION: 8 weeks  PLANNED INTERVENTIONS: 97164- PT Re-evaluation, 97110-Therapeutic exercises, 97530- Therapeutic activity, [id number]- Neuromuscular re-education, 97535- Self Care, 52841- Manual therapy, [id number]- Gait training, [phone]- Electrical stimulation (unattended), [phone]- Ionotophoresis 4mg /ml Dexamethasone, Balance training, Stair training, Taping, Dry Needling, Joint mobilization, Spinal mobilization, Cryotherapy, and Moist heat  PLAN FOR NEXT SESSION: Assess response to HEP; progress therex as indicated; use of modalities, manual therapy; and TPDN as indicated. Complete bilat knee and hip evaluations in the next 3 PT visits  Joellyn Rued MS, PT 04/28/23 2:13 PM

## 2023-04-27 ENCOUNTER — Other Ambulatory Visit: Payer: Self-pay | Admitting: Physician Assistant

## 2023-04-27 ENCOUNTER — Ambulatory Visit: Attending: Family

## 2023-04-27 ENCOUNTER — Telehealth: Payer: Self-pay | Admitting: Physician Assistant

## 2023-04-27 DIAGNOSIS — M170 Bilateral primary osteoarthritis of knee: Secondary | ICD-10-CM | POA: Diagnosis not present

## 2023-04-27 DIAGNOSIS — M25552 Pain in left hip: Secondary | ICD-10-CM | POA: Diagnosis present

## 2023-04-27 DIAGNOSIS — M25561 Pain in right knee: Secondary | ICD-10-CM | POA: Insufficient documentation

## 2023-04-27 DIAGNOSIS — M05751 Rheumatoid arthritis with rheumatoid factor of right hip without organ or systems involvement: Secondary | ICD-10-CM | POA: Insufficient documentation

## 2023-04-27 DIAGNOSIS — M25512 Pain in left shoulder: Secondary | ICD-10-CM | POA: Insufficient documentation

## 2023-04-27 DIAGNOSIS — M6281 Muscle weakness (generalized): Secondary | ICD-10-CM | POA: Insufficient documentation

## 2023-04-27 DIAGNOSIS — M05752 Rheumatoid arthritis with rheumatoid factor of left hip without organ or systems involvement: Secondary | ICD-10-CM | POA: Diagnosis not present

## 2023-04-27 DIAGNOSIS — G8929 Other chronic pain: Secondary | ICD-10-CM | POA: Diagnosis present

## 2023-04-27 DIAGNOSIS — R262 Difficulty in walking, not elsewhere classified: Secondary | ICD-10-CM | POA: Insufficient documentation

## 2023-04-27 DIAGNOSIS — M25562 Pain in left knee: Secondary | ICD-10-CM | POA: Insufficient documentation

## 2023-04-27 DIAGNOSIS — M25551 Pain in right hip: Secondary | ICD-10-CM | POA: Diagnosis present

## 2023-04-27 MED ORDER — TRAMADOL HCL 50 MG PO TABS: 50.0000 mg | ORAL_TABLET | Freq: Two times a day (BID) | ORAL | 0 refills | Status: DC | PRN

## 2023-04-27 NOTE — Telephone Encounter (Signed)
 Patient called. She would like Tramadol sent in for her. Her cb# [phone]

## 2023-04-27 NOTE — Telephone Encounter (Signed)
Called and notified via voicemail

## 2023-04-27 NOTE — Telephone Encounter (Signed)
 sent

## 2023-04-28 ENCOUNTER — Other Ambulatory Visit: Payer: Self-pay

## 2023-04-29 ENCOUNTER — Ambulatory Visit (INDEPENDENT_AMBULATORY_CARE_PROVIDER_SITE_OTHER): Admitting: Sports Medicine

## 2023-04-29 ENCOUNTER — Encounter: Payer: Self-pay | Admitting: Sports Medicine

## 2023-04-29 ENCOUNTER — Other Ambulatory Visit: Payer: Self-pay

## 2023-04-29 DIAGNOSIS — M19012 Primary osteoarthritis, left shoulder: Secondary | ICD-10-CM

## 2023-04-29 MED ORDER — METHYLPREDNISOLONE ACETATE 40 MG/ML IJ SUSP
40.0000 mg | INTRAMUSCULAR | Status: AC | PRN
  Administered 2023-04-29: 40 mg via INTRA_ARTICULAR

## 2023-04-29 MED ORDER — BUPIVACAINE HCL 0.25 % IJ SOLN
2.0000 mL | INTRAMUSCULAR | Status: AC | PRN
Start: 2023-04-29 — End: 2023-04-29
  Administered 2023-04-29: 2 mL via INTRA_ARTICULAR

## 2023-04-29 MED ORDER — LIDOCAINE HCL 1 % IJ SOLN
2.0000 mL | INTRAMUSCULAR | Status: AC | PRN
  Administered 2023-04-29: 2 mL

## 2023-04-29 NOTE — Progress Notes (Signed)
   Procedure Note  Patient: Brittney [name]             Date of Birth: 1970-05-26           MRN: [id number]             Visit Date: 04/29/2023  Procedures: Visit Diagnoses:  1. Primary osteoarthritis, left shoulder    Large Joint Inj: L glenohumeral on 04/29/2023 10:10 AM Indications: pain Details: 22 G 3.5 in needle, ultrasound-guided posterior approach Medications: 2 mL lidocaine 1 %; 2 mL bupivacaine 0.25 %; 40 mg methylPREDNISolone acetate 40 MG/ML Outcome: tolerated well, no immediate complications  US-guided glenohumeral joint injection, left shoulder After discussion on risks/benefits/indications, informed verbal consent was obtained. A timeout was then performed. The patient was positioned lying lateral recumbent on examination table. The patient's shoulder was prepped with betadine and multiple alcohol swabs and utilizing ultrasound guidance, the patient's glenohumeral joint was identified on ultrasound. Using ultrasound guidance a 22-gauge, 3.5 inch needle with a mixture of 2:2:1 cc's lidocaine:bupivicaine:depomedrol was directed from a lateral to medial direction via in-plane technique into the glenohumeral joint with visualization of appropriate spread of injectate into the joint. Patient tolerated the procedure well without immediate complications.      Procedure, treatment alternatives, risks and benefits explained, specific risks discussed. Consent was given by the patient. Immediately prior to procedure a time out was called to verify the correct patient, procedure, equipment, support staff and site/side marked as required. Patient was prepped and draped in the usual sterile fashion.     - post-injection protocol discussed - f/u with Mikey Kirschner or Dr. Roda Shutters as indicated for shoulder - I am happy to see her as needed  Madelyn Brunner, DO Primary Care Sports Medicine Physician  [facility] Orthopedics  This note was dictated using Dragon naturally speaking  software and may contain errors in syntax, spelling, or content which have not been identified prior to signing this note.

## 2023-05-02 ENCOUNTER — Encounter: Payer: Self-pay | Admitting: Physical Therapy

## 2023-05-02 ENCOUNTER — Ambulatory Visit: Admitting: Physical Therapy

## 2023-05-02 DIAGNOSIS — M25512 Pain in left shoulder: Secondary | ICD-10-CM | POA: Diagnosis not present

## 2023-05-02 DIAGNOSIS — G8929 Other chronic pain: Secondary | ICD-10-CM

## 2023-05-02 NOTE — Therapy (Signed)
 OUTPATIENT PHYSICAL THERAPY SHOULDER TREATMENT    Patient Name: Brittney [name] MRN: [id number] DOB:1970/11/12, 53 y.o., female Today's Date: 05/02/2023  END OF SESSION:  PT End of Session - 05/02/23 0804     Visit Number 2    Number of Visits 17    Date for PT Re-Evaluation 07/01/23    Authorization Type [employer] BLUE MEDICARE; MEDICAID OF [place]- no auth reqd    PT Start Time 0803    PT Stop Time 0841    PT Time Calculation (min) 38 min             Past Medical History:  Diagnosis Date   Abscess    to eyelid   Anxiety    Arthritis    KNEES ELBOWS HIPS SHOULDER FINGERS   Asthma    Back pain    Bipolar disorder (HCC)    Depression    BIPOLAR   Dyspnea    Fibromyalgia    GERD (gastroesophageal reflux disease)    Headache syndrome 12/12/2019   Headache(784.0)    MIGRAINES   History of colonoscopy    History of degenerative disc disease    Hyperlipidemia    Major depressive disorder    Memory difficulties 06/21/2014   Neck pain    OA (osteoarthritis) of knee    Obesity    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   Rheumatoid arthritis (HCC)    Seizures (HCC)    FACIAL SEIZURES LAST 4 DAYS AGO   Past Surgical History:  Procedure Laterality Date   CARDIAC CATHETERIZATION  2010   DILATION AND CURETTAGE OF UTERUS  1994   LAPAROSCOPY N/A 11/21/2012   Procedure: LAPAROSCOPY OPERATIVE REMOVAL OF RIGHT TUBE AND OVARY AND FLUID FROM MASS;  Surgeon: Bing Plume, MD;  Location: [facility];  Service: Gynecology;  Laterality: N/A;  1 1/2hrs OR time   OOPHORECTOMY     Patient Active Problem List   Diagnosis Date Noted   Weakness of both legs 08/31/2021   Myalgia, multiple sites 08/31/2021   Back pain with radiculopathy 01/07/2021   Hyperlipidemia    OA (osteoarthritis) of knee    GERD (gastroesophageal reflux disease)    Rheumatoid arthritis (HCC)    Headache syndrome 12/12/2019   Bipolar 1 disorder, depressed, moderate (HCC) 12/14/2018   Spondylosis of lumbar region  without myelopathy or radiculopathy 06/25/2015   Fibromyalgia 02/03/2015   Tendinitis of both rotator cuffs 02/03/2015   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   Insomnia secondary to chronic pain 11/13/2014   Fatigue due to sleep pattern disturbance 11/13/2014   Memory difficulties 06/21/2014   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   PTSD (post-traumatic stress disorder) 12/13/2013   Bipolar depression (HCC) 12/12/2013   PERICARDIAL EFFUSION 12/23/2008   PSEUDOTUMOR CEREBRI 12/05/2008   UNSPECIFIED PLEURAL EFFUSION 12/05/2008   DYSPNEA 12/05/2008   OBESITY 11/19/2008   Migraine 11/19/2008   ASTHMA 11/19/2008   OTHER SPECIFIED DISORDER OF STOMACH AND DUODENUM 03/11/2008    PCP: Caesar Bookman, NP   REFERRING PROVIDER: Ngetich, Donalee Citrin, NP   REFERRING DIAG:  [phone] (ICD-10-CM) - Chronic left shoulder pain  M17.0 (ICD-10-CM) - Primary osteoarthritis of both knees  M05.751,M05.752 (ICD-10-CM) - 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 Did not complete HEP per MD instructions for 2-3 days.    Pt requested her L shoulder pain be addressed first. Pt reports her L shoulder has been hurting since January when she started exercising more often with both aquatics and a trainer. Currently, she is not working with the trainer and she finds water exercises help to decrease the L shoulder pain. In 2019, she states she had a labral tear. Pt notes she is to receive a cortisone injection for the L shoulder on Friday.  Pt received cortisone injections of both knees yesterday and they feel much better. Overall, she reports her physical current issues are debilitating.  Hand dominance: Right  PERTINENT HISTORY: Obesity, fibromyalgia, headache syndrome, DDD, neck pain  PAIN:  Are you having pain? Yes: NPRS scale: 4/10. Pain range the week prior to start of PT:7-9/10 Pain location: Anterior L shoulder Pain description: burning, sharp Aggravating factors: Use of it Relieving factors: Water therapy  PRECAUTIONS: None  RED FLAGS: None   WEIGHT BEARING RESTRICTIONS: No  FALLS:  Has patient fallen in last 6 months? Yes. Number of falls 1 Sharp pain with feet and dropped  LIVING ENVIRONMENT: Lives with: lives with their family Lives in: House/apartment Able to access home  OCCUPATION: Geophysicist/field seismologist  PLOF: Independent  PATIENT GOALS:Pain relief, improved function  NEXT MD VISIT:   OBJECTIVE:  Note: Objective measures were completed at Evaluation unless otherwise noted.  DIAGNOSTIC FINDINGS:  Xray R shoulder 07/16/22 IMPRESSION: Mild to moderate glenohumeral joint degeneration. No acute osseous abnormality identified about the left shoulder.  Xray R knee 04/26/23 X-rays demonstrate moderate medial and patellofemoral degenerative changes  Xray L knee 04/26/23 X-rays demonstrate moderate medial and patellofemoral degenerative changes   PATIENT SURVEYS:  Quick Dash 47/55=89% disability  COGNITION: Overall cognitive status: Within functional limits for tasks assessed     SENSATION: WFL  POSTURE: Forward head and rounded shoulder  UPPER EXTREMITY ROM:   All L shoulder AROMs were painful with appropriate ROM. Pt completes at a slow pace Active ROM Right eval Left eval Left 05/02/23  Shoulder flexion  130 140  Shoulder extension     Shoulder abduction  130 160  Shoulder adduction     Shoulder internal rotation  T9   Shoulder external rotation  T4   Elbow flexion      Elbow extension     Wrist flexion     Wrist extension     Wrist ulnar deviation     Wrist radial deviation     Wrist pronation     Wrist supination     (Blank rows = not tested)  UPPER EXTREMITY MMT:  L shoulder strength was 4 to 4+/5 in a neutral position without significant increase in pain MMT Right eval Left eval  Shoulder flexion    Shoulder extension    Shoulder abduction    Shoulder adduction    Shoulder internal rotation    Shoulder external rotation    Middle trapezius    Lower trapezius    Elbow flexion    Elbow extension    Wrist flexion    Wrist extension    Wrist ulnar deviation    Wrist radial deviation    Wrist pronation    Wrist supination    Grip strength (lbs)    (Blank rows = not tested)  SHOULDER SPECIAL TESTS: Impingement tests: Hawkins/Kennedy impingement test: negative SLAP lesions: Crank test: negative and Biceps  load test: negative Rotator cuff assessment: Empty can test: negative and Full can test: negative Biceps assessment: Speed's test: negative  JOINT MOBILITY TESTING:  Stable GH jt  PALPATION:  TTP to the L upper trap                                                                                                                             TREATMENT DATE:  [facility] Adult PT Treatment:                                                DATE: 05/02/23 Therapeutic Activity: Pendulum flex/et per HEP Seated pulleys  flexion and abduction Shoulder ROW Red band x 15  Shoulder Ext Red band x 10 Standing IR yellow band x 10  Supine shoulder chest press x 10  Supine shoulder pullovers with dowel x 10 S/L shoulder abduction AROM x 10 S/L shoulder flexion to horizontal abduction x 10     [facility] Adult PT Treatment:                                                DATE: 04/27/23 Therapeutic Exercise: Developed, instructed in, and pt completed therex as noted in HEP  Self Care: Recommendations for sleeping position c pillow support  PATIENT  EDUCATION: Education details: Eval findings, POC, HEP Person educated: Patient Education method: Explanation, Demonstration, Tactile cues, and Verbal cues Education comprehension: verbalized understanding, returned demonstration, verbal cues required, and tactile cues required  HOME EXERCISE PROGRAM: Access Code: [email or web address] URL: [email or web address] Date: 04/27/2023 Prepared by: Joellyn Rued  Exercises - Flexion-Extension Shoulder Pendulum with Table Support  - 3 x daily - 7 x weekly - 1 sets - 20 reps  ASSESSMENT:  CLINICAL IMPRESSION: Pt reports less shoulder pain since injection. OH AROM improved. Progress with AROM and gentle strengthening which was tolerated well in clinic. Did not updated HEP today. Will assess response to today's session and plan to updated shoulder HEP. Primary PT will also evaluate knees/hips at next session.    EVAL: Patient is a 53 y.o. 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   OBJECTIVE IMPAIRMENTS: decreased activity tolerance, decreased balance, difficulty walking, decreased strength, postural dysfunction, obesity, and pain.   ACTIVITY LIMITATIONS: carrying, lifting, bending, sitting, standing, squatting, sleeping, stairs, transfers, bathing, toileting, dressing, reach over head, locomotion level, and caring for others  PARTICIPATION LIMITATIONS: meal prep, cleaning, laundry, driving, shopping, community activity, and occupation  PERSONAL FACTORS: Fitness, Past/current experiences,  Time since onset of injury/illness/exacerbation, and 3+ comorbidities:    Obesity, fibromyalgia, headache syndrome, DDD, neck pain are also affecting patient's functional outcome.   REHAB POTENTIAL:  Good  CLINICAL DECISION MAKING: Evolving/moderate complexity  EVALUATION COMPLEXITY: Moderate   GOALS:  SHORT TERM GOALS: Target date: 05/12/23  Pt will be Ind in an initial HEP  Baseline:started Goal status: INITIAL  2.  Pt will voice understanding of measures to assist in pain reduction  Baseline:  Goal status: INITIAL   LONG TERM GOALS: Target date: 07/01/23  Pt will be Ind in a final HEP to maintain achieved LOF  Baseline: started Goal status: INITIAL  2.  Pt will report 50% or greater improvement for her L shoulder for improved function and QOL. Baseline: 7-9/10 Goal status: INITIAL  3.  Pt will demonstrate the ability to lift 3# OH with good quality of movement for functional use with ADLs  and home activites Baseline: Unable Goal status: INITIAL  4.  Pt's Quick Dash score will improve by the MCID to 73% as indication of improved L shoulder function  Baseline: 89% Goal status: INITIAL  PLAN:  PT FREQUENCY: 2x/week  PT DURATION: 8 weeks  PLANNED INTERVENTIONS: 97164- PT Re-evaluation, 97110-Therapeutic exercises, 97530- Therapeutic activity, [id number]- Neuromuscular re-education, 97535- Self Care, 19147- Manual therapy, [id number]- Gait training, [phone]- Electrical stimulation (unattended), [phone]- Ionotophoresis 4mg /ml Dexamethasone, Balance training, Stair training, Taping, Dry Needling, Joint mobilization, Spinal mobilization, Cryotherapy, and Moist heat  PLAN FOR NEXT SESSION: Assess response to HEP; progress therex as indicated; use of modalities, manual therapy; and TPDN as indicated. Complete bilat knee and hip evaluations in the next 3 PT visits  Jannette Spanner, PTA 05/02/23 12:10 PM Phone: [phone] Fax: [phone]

## 2023-05-03 NOTE — Therapy (Signed)
 OUTPATIENT PHYSICAL THERAPY SHOULDER TREATMENT/Lower Extremity Evaluation   Patient Name: Brittney [name] MRN: [id number] DOB:[birth year], 53 y.o., female Today's Date: 05/05/2023  END OF SESSION:  PT End of Session - 05/04/23 1015     Visit Number 3    Number of Visits 17    Date for PT Re-Evaluation 07/01/23    Authorization Type [employer]; MEDICAID OF [place]- no auth reqd    PT Start Time 0932    PT Stop Time 1017    PT Time Calculation (min) 45 min    Activity Tolerance Patient tolerated treatment well    Behavior During Therapy WFL for tasks assessed/performed              Past Medical History:  Diagnosis Date   Abscess    to eyelid   Anxiety    Arthritis    KNEES ELBOWS HIPS SHOULDER FINGERS   Asthma    Back pain    Bipolar disorder (HCC)    Depression    BIPOLAR   Dyspnea    Fibromyalgia    GERD (gastroesophageal reflux disease)    Headache syndrome 12/12/2019   Headache(784.0)    MIGRAINES   History of colonoscopy    History of degenerative disc disease    Hyperlipidemia    Major depressive disorder    Memory difficulties 06/21/2014   Neck pain    OA (osteoarthritis) of knee    Obesity    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   Rheumatoid arthritis (HCC)    Seizures (HCC)    FACIAL SEIZURES LAST 4 DAYS AGO   Past Surgical History:  Procedure Laterality Date   CARDIAC CATHETERIZATION  2010   DILATION AND CURETTAGE OF UTERUS  1994   LAPAROSCOPY N/A 11/21/2012   Procedure: LAPAROSCOPY OPERATIVE REMOVAL OF RIGHT TUBE AND OVARY AND FLUID FROM MASS;  Surgeon: Bing Plume, MD;  Location: [facility];  Service: Gynecology;  Laterality: N/A;  1 1/2hrs OR time   OOPHORECTOMY     Patient Active Problem List   Diagnosis Date Noted   Weakness of both legs 08/31/2021   Myalgia, multiple sites 08/31/2021   Back pain with radiculopathy 01/07/2021   Hyperlipidemia    OA (osteoarthritis) of knee    GERD (gastroesophageal reflux disease)     Rheumatoid arthritis (HCC)    Headache syndrome 12/12/2019   Bipolar 1 disorder, depressed, moderate (HCC) 12/14/2018   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   Fibromyalgia 02/03/2015   Tendinitis of both rotator cuffs 02/03/2015   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   Insomnia secondary to chronic pain 11/13/2014   Fatigue due to sleep pattern disturbance 11/13/2014   Memory difficulties 06/21/2014   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   PTSD (post-traumatic stress disorder) 12/13/2013   Bipolar depression (HCC) 12/12/2013   PERICARDIAL EFFUSION 12/23/2008   PSEUDOTUMOR CEREBRI 12/05/2008   UNSPECIFIED PLEURAL EFFUSION 12/05/2008   DYSPNEA 12/05/2008   OBESITY 11/19/2008   Migraine 11/19/2008   ASTHMA 11/19/2008   OTHER SPECIFIED DISORDER OF STOMACH AND DUODENUM 03/11/2008    PCP: Caesar Bookman, NP   REFERRING PROVIDER: Ngetich, Donalee Citrin, NP   REFERRING DIAG:  [phone] (ICD-10-CM) - Chronic left shoulder pain  M17.0 (ICD-10-CM) - Primary osteoarthritis of both knees  M05.751,M05.752 (ICD-10-CM) - 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 Pt reports a chronic Hx of bilat knee ad L hip pain. Knee pain is a pinch and L hip pain is stabbing. Pt reports ant and lateral L hip pain c sitting. Knee pain worsens with walking  Pt requested her L  shoulder pain be addressed first. Pt reports her L shoulder has been hurting since January when she started exercising more often with both aquatics and a trainer. Currently, she is not working with the trainer and she finds water exercises help to decrease the L shoulder pain. In 2019, she states she had a labral tear. Pt notes she is to receive a cortisone injection for the L shoulder on Friday. Pt received cortisone injections of both knees yesterday and they feel much better. Overall, she reports her physical current issues are debilitating.  Hand dominance: Right  PERTINENT HISTORY: Obesity, fibromyalgia, headache syndrome, DDD, neck pain  PAIN:  Are you having pain? Yes: NPRS scale: 4/10. Pain range the week prior to start of PT:7-9/10 Pain location: Anterior L shoulder Pain description: burning, sharp Aggravating factors: Use of it Relieving factors: Water therapy  PRECAUTIONS: None  RED FLAGS: None   WEIGHT BEARING RESTRICTIONS: No  FALLS:  Has patient fallen in last 6 months? Yes. Number of falls 1 Sharp pain with feet and dropped  LIVING ENVIRONMENT: Lives with: lives with their family Lives in: House/apartment Able to access home  OCCUPATION: Geophysicist/field seismologist  PLOF: Independent  PATIENT GOALS:Pain relief, improved function  NEXT MD VISIT:   OBJECTIVE:  Note: Objective measures were completed at Evaluation unless otherwise noted.  DIAGNOSTIC FINDINGS:  Xray R shoulder 07/16/22 IMPRESSION: Mild to moderate glenohumeral joint degeneration. No acute osseous abnormality identified about the left shoulder.  Xray R knee 04/26/23 X-rays demonstrate moderate medial and patellofemoral degenerative changes  Xray L knee 04/26/23 X-rays demonstrate moderate medial and patellofemoral degenerative changes   PATIENT SURVEYS:  Quick Dash 47/55=89% disability  COGNITION: Overall cognitive status: Within functional limits for tasks  assessed     SENSATION: WFL  POSTURE: Forward head and rounded shoulder  UPPER EXTREMITY ROM:   All L shoulder AROMs were painful with appropriate ROM. Pt completes at a slow pace Active ROM Right eval  Left eval Left 05/02/23  Shoulder flexion  130 140  Shoulder extension     Shoulder abduction  130 160  Shoulder adduction     Shoulder internal rotation  T9   Shoulder external rotation  T4   Elbow flexion     Elbow extension     Wrist flexion     Wrist extension     Wrist ulnar deviation     Wrist radial deviation     Wrist pronation     Wrist supination     (Blank rows = not tested)  UPPER EXTREMITY MMT:  L shoulder strength was 4 to 4+/5 in a neutral position without significant increase in pain MMT Right eval Left eval  Shoulder flexion    Shoulder extension    Shoulder abduction    Shoulder adduction    Shoulder internal rotation    Shoulder external rotation    Middle trapezius    Lower trapezius    Elbow flexion    Elbow extension    Wrist flexion    Wrist extension    Wrist ulnar deviation    Wrist radial deviation    Wrist pronation    Wrist supination    Grip strength (lbs)    (Blank rows = not tested)  SHOULDER SPECIAL TESTS: Impingement tests: Hawkins/Kennedy impingement test: negative SLAP lesions: Crank test: negative and Biceps load test: negative Rotator cuff assessment: Empty can test: negative and Full can test: negative Biceps assessment: Speed's test: negative  JOINT MOBILITY TESTING:  Stable GH jt  PALPATION:  TTP to the L upper trap  TTP to the L lateral greater trochanter LE Assessement LE ROM:  WNLS Active ROM Right Eval Left Eval  Hip flexion    Hip extension    Hip abduction    Hip adduction    Hip internal rotation    Hip external rotation    Knee flexion    Knee extension    Ankle dorsiflexion    Ankle plantarflexion    Ankle inversion    Ankle eversion     (Blank rows = not tested)  LE MMT:  MMT  Right Eval Left Eval  Hip flexion 4+ 4  Hip extension 4 3  Hip abduction 4+ 4  Hip adduction    Hip internal rotation    Hip external rotation 4+ 4  Knee flexion 5 5  Knee extension 5 5  Ankle dorsiflexion    Ankle plantarflexion    Ankle inversion    Ankle eversion     (Blank rows = not tested)  LOWER EXTREMITY SPECIAL TESTS:  Hip special tests: Luisa Hart (FABER) test: positive   FUNCTIONAL TESTS:  5 times sit to stand: TBA 2 minute walk test: TBA  GAIT: Distance walked: 200' Assistive device utilized: None Level of assistance: Complete Independence Comments: WNLs  TREATMENT DATE:  [facility] Adult PT Treatment:                                                DATE: 05/04/23 Therapeutic Exercise: Supine Piriformis Stretch x3 30" Supine Figure 4 Piriformis Stretch x3 30" Active Straight Leg Raise with Quad Set 2x10 Supine Bridge 2x10 BluTB Hooklying Clamshell 2x10 BluTB Supine March 2x10 BluTB Updated HEP  [facility] Adult PT Treatment:                                                DATE: 05/02/23 Therapeutic Activity: Pendulum flex/et per HEP Seated pulleys  flexion and abduction Shoulder ROW Red band x 15  Shoulder Ext Red band x 10 Standing IR yellow band x 10  Supine shoulder chest press x 10  Supine shoulder pullovers with dowel x 10 S/L shoulder abduction AROM x 10 S/L shoulder flexion to horizontal abduction x 10  [facility] Adult PT Treatment:                                                DATE: 04/27/23 Therapeutic Exercise: Developed, instructed in, and pt completed therex as noted in HEP  Self Care: Recommendations for sleeping position c pillow support  PATIENT EDUCATION: Education details: Eval findings, POC, HEP Person educated: Patient Education method: Explanation, Demonstration, Tactile cues, and Verbal cues Education comprehension:  verbalized understanding, returned demonstration, verbal cues required, and tactile cues required  HOME EXERCISE PROGRAM: Access Code: [email or web address] URL: [email or web address] Date: 05/04/2023 Prepared by: Joellyn Rued  Exercises - Flexion-Extension Shoulder Pendulum with Table Support  - 3 x daily - 7 x weekly - 1 sets - 20 reps - Seated Piriformis Stretch  - 1 x daily - 7 x weekly - 1 sets - 3 reps - 30 hold - Seated Piriformis Stretch with Trunk Bend  - 1 x daily - 7 x weekly - 1 sets - 3 reps - 30 hold - Supine Piriformis Stretch with Foot on Ground  - 1 x daily - 7 x weekly - 1 sets - 3 reps - 30 hold - Supine Figure 4 Piriformis Stretch  - 1 x daily - 7 x weekly - 1 sets - 3 reps - 30 hold - Active Straight Leg Raise with Quad Set  - 1 x daily - 7 x weekly - 2 sets - 10 reps - 3 hold - Supine Bridge with Resistance Band  - 1 x daily - 7 x weekly - 2 sets - 10 reps - 2 hold - Hooklying Clamshell with Resistance  - 1 x daily - 7 x weekly - 2 sets - 10 reps - 2 hold - Supine March with Resistance Band  - 1 x daily - 7 x weekly - 2 sets - 10 reps - 2 hold ASSESSMENT:  CLINICAL IMPRESSION: Completed assessment for bilat legs. Knee pain appears OA related while the L hip seems most positive for tendinopathy. Therex were completed for hip mobility and for LE strengthening. A HEP was developed with a written program provided to the pt. Pt tolerated PT today  without adverse effects. Pt will continue to benefit from skilled PT to address impairments for improved function with minimized pain. Will assess 5xSTS and the next PT session.  EVAL: Patient is a 53 y.o. 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   OBJECTIVE  IMPAIRMENTS: decreased activity tolerance, decreased balance, difficulty walking, decreased strength, postural dysfunction, obesity, and pain.   ACTIVITY LIMITATIONS: carrying, lifting, bending, sitting, standing, squatting, sleeping, stairs, transfers, bathing, toileting, dressing, reach over head, locomotion level, and caring for others  PARTICIPATION LIMITATIONS: meal prep, cleaning, laundry, driving, shopping, community activity, and occupation  PERSONAL FACTORS: Fitness, Past/current experiences, Time since onset of injury/illness/exacerbation, and 3+ comorbidities:    Obesity, fibromyalgia, headache syndrome, DDD, neck pain are also affecting patient's functional outcome.   REHAB POTENTIAL: Good  CLINICAL DECISION MAKING: Evolving/moderate complexity  EVALUATION COMPLEXITY: Moderate   GOALS:  SHORT TERM GOALS: Target date: 05/12/23  Pt will be Ind in an initial HEP  Baseline:started Goal status: INITIAL  2.  Pt will voice understanding of measures to assist in pain reduction  Baseline:  Goal status: INITIAL   LONG TERM GOALS: Target date: 07/01/23  Pt will be Ind in a final HEP to maintain achieved LOF  Baseline: started Goal status: INITIAL  2.  Pt will report 50% or greater improvement for her L shoulder for improved function and QOL. Baseline: 7-9/10 Goal status: INITIAL  3.  Pt will demonstrate the ability to lift 3# OH with good quality of movement for functional use with ADLs  and home activites Baseline: Unable Goal status: INITIAL  4.  Pt's Quick Dash score will improve by the MCID to 73% as indication of improved L shoulder function  Baseline: 89% Goal status: INITIAL  5. Improve 5xSTS by MCID of 5" and by MCID of 82ft as indication of improved functional mobility   Baseline: TBA  Goal Status: Initial  PLAN:  PT FREQUENCY: 2x/week  PT DURATION: 8 weeks  PLANNED INTERVENTIONS: 97164- PT Re-evaluation, 97110-Therapeutic exercises, 97530-  Therapeutic activity, [id number]- Neuromuscular re-education, 97535- Self Care, [id number]- Manual therapy, [id number]- Gait training, [phone]- Electrical stimulation (unattended), [phone]- Ionotophoresis 4mg /ml Dexamethasone, Balance training, Stair training, Taping, Dry Needling, Joint mobilization, Spinal mobilization, Cryotherapy, and Moist heat  PLAN FOR NEXT SESSION: Assess response to HEP; progress therex as indicated; use of modalities, manual therapy; and TPDN as indicated. Complete bilat knee and hip evaluations in the next 3 PT visits  Joellyn Rued MS, PT 05/05/23 6:19 AM

## 2023-05-04 ENCOUNTER — Ambulatory Visit

## 2023-05-04 DIAGNOSIS — M25512 Pain in left shoulder: Secondary | ICD-10-CM | POA: Diagnosis not present

## 2023-05-04 DIAGNOSIS — R262 Difficulty in walking, not elsewhere classified: Secondary | ICD-10-CM

## 2023-05-04 DIAGNOSIS — G8929 Other chronic pain: Secondary | ICD-10-CM

## 2023-05-04 DIAGNOSIS — M6281 Muscle weakness (generalized): Secondary | ICD-10-CM

## 2023-05-04 DIAGNOSIS — M25552 Pain in left hip: Secondary | ICD-10-CM

## 2023-05-09 ENCOUNTER — Ambulatory Visit: Admitting: Physical Therapy

## 2023-05-09 ENCOUNTER — Encounter: Payer: Self-pay | Admitting: Physical Therapy

## 2023-05-09 DIAGNOSIS — G8929 Other chronic pain: Secondary | ICD-10-CM

## 2023-05-09 DIAGNOSIS — M25552 Pain in left hip: Secondary | ICD-10-CM

## 2023-05-09 DIAGNOSIS — M25512 Pain in left shoulder: Secondary | ICD-10-CM | POA: Diagnosis not present

## 2023-05-09 NOTE — Therapy (Addendum)
 OUTPATIENT PHYSICAL THERAPY SHOULDER TREATMENT/Lower Extremity TREATMENT    Patient Name: Brittney [name] MRN: [id number] DOB:[birth year], 53 y.o., female Today's Date: 05/09/2023  END OF SESSION:  PT End of Session - 05/09/23 1318     Visit Number 4    Number of Visits 17    Date for PT Re-Evaluation 07/01/23    Authorization Type [employer]; MEDICAID [place]- no auth reqd    PT Start Time 0115    PT Stop Time 0200    PT Time Calculation (min) 45 min              Past Medical History:  Diagnosis Date   Abscess    to eyelid   Anxiety    Arthritis    KNEES ELBOWS HIPS SHOULDER FINGERS   Asthma    Back pain    Bipolar disorder (HCC)    Depression    BIPOLAR   Dyspnea    Fibromyalgia    GERD (gastroesophageal reflux disease)    Headache syndrome 12/12/2019   Headache(784.0)    MIGRAINES   History of colonoscopy    History of degenerative disc disease    Hyperlipidemia    Major depressive disorder    Memory difficulties 06/21/2014   Neck pain    OA (osteoarthritis) of knee    Obesity    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   Rheumatoid arthritis (HCC)    Seizures (HCC)    FACIAL SEIZURES LAST 4 DAYS AGO   Past Surgical History:  Procedure Laterality Date   CARDIAC CATHETERIZATION  2010   DILATION AND CURETTAGE OF UTERUS  1994   LAPAROSCOPY N/A 11/21/2012   Procedure: LAPAROSCOPY OPERATIVE REMOVAL OF RIGHT TUBE AND OVARY AND FLUID FROM MASS;  Surgeon: Bing Plume, MD;  Location: [facility];  Service: Gynecology;  Laterality: N/A;  1 1/2hrs OR time   OOPHORECTOMY     Patient Active Problem List   Diagnosis Date Noted   Weakness of both legs 08/31/2021   Myalgia, multiple sites 08/31/2021   Back pain with radiculopathy 01/07/2021   Hyperlipidemia    OA (osteoarthritis) of knee    GERD (gastroesophageal reflux disease)    Rheumatoid arthritis (HCC)    Headache syndrome 12/12/2019   Bipolar 1 disorder, depressed, moderate (HCC) 12/14/2018    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   Fibromyalgia 02/03/2015   Tendinitis of both rotator cuffs 02/03/2015   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   Insomnia secondary to chronic pain 11/13/2014   Fatigue due to sleep pattern disturbance 11/13/2014   Memory difficulties 06/21/2014   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   PTSD (post-traumatic stress disorder) 12/13/2013   Bipolar depression (HCC) 12/12/2013   PERICARDIAL EFFUSION 12/23/2008   PSEUDOTUMOR CEREBRI 12/05/2008   UNSPECIFIED PLEURAL EFFUSION 12/05/2008   DYSPNEA 12/05/2008   OBESITY 11/19/2008   Migraine 11/19/2008   ASTHMA 11/19/2008   OTHER SPECIFIED DISORDER OF STOMACH AND DUODENUM 03/11/2008    PCP: Caesar Bookman, NP   REFERRING PROVIDER: Ngetich, Donalee Citrin, NP   REFERRING DIAG:  [phone] (ICD-10-CM) - Chronic left shoulder pain  M17.0 (ICD-10-CM) - Primary osteoarthritis of both knees  M05.751,M05.752 (ICD-10-CM) - 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 3/10 shoulder pain.  4/10 bilat knees.    Pt reports L shoulder is hurting a little. Pt reports a chronic Hx of bilat knee ad L hip pain. Knee pain is a pinch and L hip pain is stabbing. Pt reports ant and lateral L hip pain c sitting. Knee pain worsens with walking  Pt requested her L shoulder pain be addressed first. Pt reports her L shoulder has been hurting since January when she started exercising more often with both  aquatics and a trainer. Currently, she is not working with the trainer and she finds water exercises help to decrease the L shoulder pain. In 2019, she states she had a labral tear. Pt notes she is to receive a cortisone injection for the L shoulder on Friday. Pt received cortisone injections of both knees yesterday and they feel much better. Overall, she reports her physical current issues are debilitating.  Hand dominance: Right  PERTINENT HISTORY: Obesity, fibromyalgia, headache syndrome, DDD, neck pain  PAIN:  Are you having pain? Yes: NPRS scale: 4/10. Pain range the week prior to start of PT:7-9/10 Pain location: Anterior L shoulder Pain description: burning, sharp Aggravating factors: Use of it Relieving factors: Water therapy  PRECAUTIONS: None  RED FLAGS: None   WEIGHT BEARING RESTRICTIONS: No  FALLS:  Has patient fallen in last 6 months? Yes. Number of falls 1 Sharp pain with feet and dropped  LIVING ENVIRONMENT: Lives with: lives with their family Lives in: House/apartment Able to access home  OCCUPATION: Geophysicist/field seismologist  PLOF: Independent  PATIENT GOALS:Pain relief, improved function  NEXT MD VISIT:   OBJECTIVE:  Note: Objective measures were completed at Evaluation unless otherwise noted.  DIAGNOSTIC FINDINGS:  Xray R shoulder 07/16/22 IMPRESSION: Mild to moderate glenohumeral joint degeneration. No acute osseous abnormality identified about the left shoulder.  Xray R knee 04/26/23 X-rays demonstrate moderate medial and patellofemoral degenerative changes  Xray L knee 04/26/23 X-rays demonstrate moderate medial and patellofemoral degenerative changes   PATIENT SURVEYS:  Quick Dash 47/55=89% disability  COGNITION: Overall cognitive status: Within functional limits for tasks assessed     SENSATION: WFL  POSTURE: Forward head and rounded shoulder  UPPER EXTREMITY ROM:   All L shoulder AROMs were painful with appropriate ROM. Pt completes  at a slow pace Active ROM Right eval Left eval Left 05/02/23  Shoulder flexion  130 140  Shoulder extension  Shoulder abduction  130 160  Shoulder adduction     Shoulder internal rotation  T9   Shoulder external rotation  T4   Elbow flexion     Elbow extension     Wrist flexion     Wrist extension     Wrist ulnar deviation     Wrist radial deviation     Wrist pronation     Wrist supination     (Blank rows = not tested)  UPPER EXTREMITY MMT:  L shoulder strength was 4 to 4+/5 in a neutral position without significant increase in pain MMT Right eval Left eval  Shoulder flexion    Shoulder extension    Shoulder abduction    Shoulder adduction    Shoulder internal rotation    Shoulder external rotation    Middle trapezius    Lower trapezius    Elbow flexion    Elbow extension    Wrist flexion    Wrist extension    Wrist ulnar deviation    Wrist radial deviation    Wrist pronation    Wrist supination    Grip strength (lbs)    (Blank rows = not tested)  SHOULDER SPECIAL TESTS: Impingement tests: Hawkins/Kennedy impingement test: negative SLAP lesions: Crank test: negative and Biceps load test: negative Rotator cuff assessment: Empty can test: negative and Full can test: negative Biceps assessment: Speed's test: negative  JOINT MOBILITY TESTING:  Stable GH jt  PALPATION:  TTP to the L upper trap  TTP to the L lateral greater trochanter LE Assessement LE ROM:  WNLS Active ROM Right Eval Left Eval  Hip flexion    Hip extension    Hip abduction    Hip adduction    Hip internal rotation    Hip external rotation    Knee flexion    Knee extension    Ankle dorsiflexion    Ankle plantarflexion    Ankle inversion    Ankle eversion     (Blank rows = not tested)  LE MMT:  MMT Right Eval Left Eval  Hip flexion 4+ 4  Hip extension 4 3  Hip abduction 4+ 4  Hip adduction    Hip internal rotation    Hip external rotation 4+ 4  Knee flexion 5 5  Knee  extension 5 5  Ankle dorsiflexion    Ankle plantarflexion    Ankle inversion    Ankle eversion     (Blank rows = not tested)  LOWER EXTREMITY SPECIAL TESTS:  Hip special tests: Luisa Hart (FABER) test: positive   FUNCTIONAL TESTS:  5 times sit to stand: TBA 2 minute walk test: TBA 05/09/23:  5 x STS with UE support : 15.3 sec  05/09/23: 474 feet   GAIT: Distance walked: 200' Assistive device utilized: None Level of assistance: Complete Independence Comments: WNLs  TREATMENT DATE:  [facility] Adult PT Treatment:                                                DATE: 05/09/23 Therapeutic Exercise: Review of hip/kneeHEP Updated HEP for shoulder Rows GTB Ext Red  IR and ER  red , left 10 x 2 for all above   Therapeutic Activity: 5 x STS with UE support : 15.3 sec  2 MWT 474 feet     [facility] Adult PT Treatment:                                                DATE: 05/04/23 Therapeutic Exercise: Supine Piriformis Stretch x3 30" Supine Figure 4 Piriformis Stretch x3 30" Active Straight Leg Raise with Quad Set 2x10 Supine Bridge 2x10 BluTB Hooklying Clamshell 2x10 BluTB Supine March 2x10 BluTB Updated HEP  [facility] Adult PT Treatment:                                                DATE: 05/02/23 Therapeutic Activity: Pendulum flex/et per HEP Seated pulleys  flexion and abduction Shoulder ROW Red band x 15  Shoulder Ext Red band x 10 Standing IR yellow band x 10  Supine shoulder chest press x 10  Supine shoulder pullovers with dowel x 10 S/L shoulder abduction AROM x 10 S/L shoulder flexion to horizontal abduction x 10  [facility] Adult PT Treatment:                                                DATE: 04/27/23 Therapeutic Exercise: Developed, instructed in, and pt completed therex as noted in HEP  Self Care: Recommendations for sleeping position c pillow  support  PATIENT EDUCATION: Education details: Eval findings, POC, HEP Person educated: Patient Education method: Explanation, Demonstration, Tactile cues, and Verbal cues Education comprehension: verbalized understanding, returned demonstration, verbal cues required, and tactile cues required  HOME EXERCISE PROGRAM: Access Code: [email or web address] URL: [email or web address] Date: 05/04/2023 Prepared by: Joellyn Rued  Exercises - Flexion-Extension Shoulder Pendulum with Table Support  - 3 x daily - 7 x weekly - 1 sets - 20 reps - Seated Piriformis Stretch  - 1 x daily - 7 x weekly - 1 sets - 3 reps - 30 hold - Seated Piriformis Stretch with Trunk Bend  - 1 x daily - 7 x weekly - 1 sets - 3 reps - 30 hold - Supine Piriformis Stretch with Foot on Ground  - 1 x daily - 7 x weekly - 1 sets - 3 reps - 30 hold - Supine Figure 4 Piriformis Stretch  - 1 x daily - 7 x weekly - 1 sets - 3 reps - 30 hold - Active Straight Leg Raise with Quad Set  - 1 x daily - 7 x weekly - 2 sets - 10 reps - 3 hold - Supine Bridge with Resistance Band  - 1 x daily - 7 x weekly - 2 sets -  10 reps - 2 hold - Hooklying Clamshell with Resistance  - 1 x daily - 7 x weekly - 2 sets - 10 reps - 2 hold - Supine March with Resistance Band  - 1 x daily - 7 x weekly - 2 sets - 10 reps - 2 hold Added 3/24 - Standing Bilateral Low Shoulder Row with Anchored Resistance  - 1 x daily - 7 x weekly - 2 sets - 10 reps - Shoulder extension with resistance - Neutral  - 1 x daily - 7 x weekly - 2 sets - 10 reps - Shoulder Internal Rotation with Resistance  - 1 x daily - 7 x weekly - 2 sets - 10 reps - Shoulder External Rotation with Anchored Resistance  - 1 x daily - 7 x weekly - 2 sets - 10 reps ASSESSMENT:  CLINICAL IMPRESSION: Pt reports compliance with HEP and increased hip pain. Captured 2 MWT and 5 x STS. Answered questions about hip stretches which she is performing in both supine and seated. She was encouraged to pick the  position most comfortable, no need to complete both positions. She may be over stretching. Reviewed strengthening portion of LE HEP and she was unaware of supine marching which was reviewed with her. She fatigues with SLR. Upon standing she felt right knee pain. Based on her response to shoulder strengthening at first treatment, updated HEP for her shoulder today. Will progress as tolerated.    Re-eval: Completed assessment for bilat legs. Knee pain appears OA related while the L hip seems most positive for tendinopathy. Therex were completed for hip mobility and for LE strengthening. A HEP was developed with a written program provided to the pt. Pt tolerated PT today without adverse effects. Pt will continue to benefit from skilled PT to address impairments for improved function with minimized pain. Will assess 5xSTS and the next PT session.  EVAL: Patient is a 53 y.o. 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   OBJECTIVE IMPAIRMENTS: decreased activity tolerance, decreased balance, difficulty walking, decreased strength, postural dysfunction, obesity, and pain.   ACTIVITY LIMITATIONS: carrying, lifting, bending, sitting, standing, squatting, sleeping, stairs, transfers, bathing, toileting, dressing, reach over head, locomotion level, and caring for others  PARTICIPATION LIMITATIONS: meal prep, cleaning, laundry, driving, shopping, community activity, and occupation  PERSONAL FACTORS: Fitness, Past/current experiences, Time since onset of injury/illness/exacerbation, and 3+ comorbidities:    Obesity, fibromyalgia, headache syndrome, DDD, neck pain are also affecting patient's functional outcome.   REHAB POTENTIAL: Good  CLINICAL  DECISION MAKING: Evolving/moderate complexity  EVALUATION COMPLEXITY: Moderate   GOALS:  SHORT TERM GOALS: Target date: 05/12/23  Pt will be Ind in an initial HEP  Baseline:started Goal status: INITIAL  2.  Pt will voice understanding of measures to assist in pain reduction  Baseline:  Goal status: INITIAL   LONG TERM GOALS: Target date: 07/01/23  Pt will be Ind in a final HEP to maintain achieved LOF  Baseline: started Goal status: INITIAL  2.  Pt will report 50% or greater improvement for her L shoulder for improved function and QOL. Baseline: 7-9/10 Goal status: INITIAL  3.  Pt will demonstrate the ability to lift 3# OH with good quality of movement for functional use with ADLs  and home activites  Baseline: Unable Goal status: INITIAL  4.  Pt's Quick Dash score will improve by the MCID to 73% as indication of improved L shoulder function  Baseline: 89% Goal status: INITIAL  5. Improve 5xSTS by MCID of 5" and by MCID of 41ft as indication of improved functional mobility   Baseline: TBA  Goal Status: Initial  PLAN:  PT FREQUENCY: 2x/week  PT DURATION: 8 weeks  PLANNED INTERVENTIONS: 97164- PT Re-evaluation, 97110-Therapeutic exercises, 97530- Therapeutic activity, [id number]- Neuromuscular re-education, 97535- Self Care, 16109- Manual therapy, [id number]- Gait training, [phone]- Electrical stimulation (unattended), [phone]- Ionotophoresis 4mg /ml Dexamethasone, Balance training, Stair training, Taping, Dry Needling, Joint mobilization, Spinal mobilization, Cryotherapy, and Moist heat  PLAN FOR NEXT SESSION: Assess response to HEP; progress therex as indicated; use of modalities, manual therapy; and TPDN as indicated.   Jannette Spanner, PTA 05/09/23 2:10 PM Phone: [phone] Fax: [phone]

## 2023-05-11 ENCOUNTER — Ambulatory Visit

## 2023-05-11 DIAGNOSIS — R262 Difficulty in walking, not elsewhere classified: Secondary | ICD-10-CM

## 2023-05-11 DIAGNOSIS — M25552 Pain in left hip: Secondary | ICD-10-CM

## 2023-05-11 DIAGNOSIS — M25512 Pain in left shoulder: Secondary | ICD-10-CM | POA: Diagnosis not present

## 2023-05-11 DIAGNOSIS — M6281 Muscle weakness (generalized): Secondary | ICD-10-CM

## 2023-05-11 DIAGNOSIS — G8929 Other chronic pain: Secondary | ICD-10-CM

## 2023-05-11 NOTE — Therapy (Signed)
 OUTPATIENT PHYSICAL THERAPY SHOULDER TREATMENT/Lower Extremity TREATMENT    Patient Name: FREDRICA CAPANO MRN: [id number] DOB:Jul 02, 1970, 53 y.o., female Today's Date: 05/11/2023  END OF SESSION:  PT End of Session - 05/11/23 0935     Visit Number 5    Number of Visits 17    Date for PT Re-Evaluation 07/01/23    Authorization Type [employer]; MEDICAID OF [place]- no auth reqd    PT Start Time 0930    PT Stop Time 1013    PT Time Calculation (min) 43 min    Activity Tolerance Patient tolerated treatment well    Behavior During Therapy WFL for tasks assessed/performed               Past Medical History:  Diagnosis Date   Abscess    to eyelid   Anxiety    Arthritis    KNEES ELBOWS HIPS SHOULDER FINGERS   Asthma    Back pain    Bipolar disorder (HCC)    Depression    BIPOLAR   Dyspnea    Fibromyalgia    GERD (gastroesophageal reflux disease)    Headache syndrome 12/12/2019   Headache(784.0)    MIGRAINES   History of colonoscopy    History of degenerative disc disease    Hyperlipidemia    Major depressive disorder    Memory difficulties 06/21/2014   Neck pain    OA (osteoarthritis) of knee    Obesity    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   Rheumatoid arthritis (HCC)    Seizures (HCC)    FACIAL SEIZURES LAST 4 DAYS AGO   Past Surgical History:  Procedure Laterality Date   CARDIAC CATHETERIZATION  2010   DILATION AND CURETTAGE OF UTERUS  1994   LAPAROSCOPY N/A 11/21/2012   Procedure: LAPAROSCOPY OPERATIVE REMOVAL OF RIGHT TUBE AND OVARY AND FLUID FROM MASS;  Surgeon: Bing Plume, MD;  Location: [facility];  Service: Gynecology;  Laterality: N/A;  1 1/2hrs OR time   OOPHORECTOMY     Patient Active Problem List   Diagnosis Date Noted   Weakness of both legs 08/31/2021   Myalgia, multiple sites 08/31/2021   Back pain with radiculopathy 01/07/2021   Hyperlipidemia    OA (osteoarthritis) of knee    GERD (gastroesophageal reflux disease)     Rheumatoid arthritis (HCC)    Headache syndrome 12/12/2019   Bipolar 1 disorder, depressed, moderate (HCC) 12/14/2018   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   Fibromyalgia 02/03/2015   Tendinitis of both rotator cuffs 02/03/2015   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   Insomnia secondary to chronic pain 11/13/2014   Fatigue due to sleep pattern disturbance 11/13/2014   Memory difficulties 06/21/2014   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   PTSD (post-traumatic stress disorder) 12/13/2013   Bipolar depression (HCC) 12/12/2013   PERICARDIAL EFFUSION 12/23/2008   PSEUDOTUMOR CEREBRI 12/05/2008   UNSPECIFIED PLEURAL EFFUSION 12/05/2008   DYSPNEA 12/05/2008   OBESITY 11/19/2008   Migraine 11/19/2008   ASTHMA 11/19/2008   OTHER SPECIFIED DISORDER OF STOMACH AND DUODENUM 03/11/2008    PCP: Caesar Bookman, NP   REFERRING PROVIDER: Ngetich, Donalee Citrin, NP   REFERRING DIAG:  [phone] (ICD-10-CM) - Chronic left shoulder pain  M17.0 (ICD-10-CM) - Primary osteoarthritis of both knees  M05.751,M05.752 (ICD-10-CM) - 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   Pt reports L shoulder is hurting a little. Pt reports a chronic Hx of bilat knee ad L hip pain. Knee pain is a pinch and L hip pain is stabbing. Pt reports  ant and lateral L hip pain c sitting. Knee pain worsens with walking  Pt requested her L shoulder pain be addressed first. Pt reports her L shoulder has been hurting since January when she started exercising more often with both aquatics and a trainer. Currently, she is not working with the trainer and she finds water exercises help to decrease the L shoulder pain. In 2019, she states she had a labral tear. Pt notes she is to receive a cortisone injection for the L shoulder on Friday. Pt received cortisone injections of both knees yesterday and they feel much better. Overall, she reports her physical current issues are debilitating.  Hand dominance: Right  PERTINENT HISTORY: Obesity, fibromyalgia, headache syndrome, DDD, neck pain  PAIN:  Are you having pain? Yes: NPRS scale: 4/10. Pain range the week prior to start of PT:7-9/10 Pain location: Anterior L shoulder Pain description: burning, sharp Aggravating factors: Use of it Relieving factors: Water therapy  Knees: 4/10  L hip: 2/10  PRECAUTIONS: None  RED FLAGS: None   WEIGHT BEARING RESTRICTIONS: No  FALLS:  Has patient fallen in last 6 months? Yes. Number of falls 1 Sharp pain with feet and dropped  LIVING ENVIRONMENT: Lives with: lives with their family Lives in: House/apartment Able to access home  OCCUPATION: Geophysicist/field seismologist  PLOF: Independent  PATIENT GOALS:Pain relief, improved function  NEXT MD VISIT:   OBJECTIVE:  Note: Objective measures were completed at Evaluation unless otherwise noted.  DIAGNOSTIC FINDINGS:  Xray R shoulder 07/16/22 IMPRESSION: Mild to moderate glenohumeral joint degeneration. No acute osseous abnormality identified about the left shoulder.  Xray R knee 04/26/23 X-rays demonstrate moderate medial and patellofemoral degenerative changes  Xray L knee 04/26/23 X-rays demonstrate moderate medial and patellofemoral degenerative changes   PATIENT SURVEYS:  Quick Dash 47/55=89%  disability  COGNITION: Overall cognitive status: Within functional limits for tasks assessed     SENSATION: WFL  POSTURE: Forward head and  rounded shoulder  UPPER EXTREMITY ROM:   All L shoulder AROMs were painful with appropriate ROM. Pt completes at a slow pace Active ROM Right eval Left eval Left 05/02/23 Rt 05/11/23  Shoulder flexion  130 140 165  Shoulder extension      Shoulder abduction  130 160   Shoulder adduction      Shoulder internal rotation  T9    Shoulder external rotation  T4    Elbow flexion      Elbow extension      Wrist flexion      Wrist extension      Wrist ulnar deviation      Wrist radial deviation      Wrist pronation      Wrist supination      (Blank rows = not tested)  UPPER EXTREMITY MMT:  L shoulder strength was 4 to 4+/5 in a neutral position without significant increase in pain MMT Right eval Left eval  Shoulder flexion    Shoulder extension    Shoulder abduction    Shoulder adduction    Shoulder internal rotation    Shoulder external rotation    Middle trapezius    Lower trapezius    Elbow flexion    Elbow extension    Wrist flexion    Wrist extension    Wrist ulnar deviation    Wrist radial deviation    Wrist pronation    Wrist supination    Grip strength (lbs)    (Blank rows = not tested)  SHOULDER SPECIAL TESTS: Impingement tests: Hawkins/Kennedy impingement test: negative SLAP lesions: Crank test: negative and Biceps load test: negative Rotator cuff assessment: Empty can test: negative and Full can test: negative Biceps assessment: Speed's test: negative  JOINT MOBILITY TESTING:  Stable GH jt  PALPATION:  TTP to the L upper trap  TTP to the L lateral greater trochanter LE Assessement LE ROM:  WNLS Active ROM Right Eval Left Eval  Hip flexion    Hip extension    Hip abduction    Hip adduction    Hip internal rotation    Hip external rotation    Knee flexion    Knee extension    Ankle dorsiflexion     Ankle plantarflexion    Ankle inversion    Ankle eversion     (Blank rows = not tested)  LE MMT:  MMT Right Eval Left Eval  Hip flexion 4+ 4  Hip extension 4 3  Hip abduction 4+ 4  Hip adduction    Hip internal rotation    Hip external rotation 4+ 4  Knee flexion 5 5  Knee extension 5 5  Ankle dorsiflexion    Ankle plantarflexion    Ankle inversion    Ankle eversion     (Blank rows = not tested)  LOWER EXTREMITY SPECIAL TESTS:  Hip special tests: Luisa Hart (FABER) test: positive   FUNCTIONAL TESTS:  5 times sit to stand: TBA 2 minute walk test: TBA 05/09/23:  5 x STS with UE support : 15.3 sec  05/09/23: 474 feet   GAIT: Distance walked: 200' Assistive device utilized: None Level of assistance: Complete Independence Comments: WNLs  TREATMENT DATE:  [facility] Adult PT Treatment:                                                DATE: 05/11/22 Therapeutic Exercise: Supine Piriformis Stretch x3 30" Supine Figure 4 Piriformis Stretch x3 30" Active Straight Leg Raise with Quad Set 2x10 Active Straight Leg Raise in ER with Quad Set 2x10 Supine banded Bridge 2x10 BluTB Hooklying Clamshell 2x10 BluTB Supine March 2x10 BluTB Manual Therapy: Rock taping for both knees- taping patellas laterally approx 50% stretch Modalities: Iontophoresis to the L anterior GH jt area c 5mg /ml dexamethasone for 6 hours Advised not to get the patch wet and of possible skin irritation to the adhesive of the patch    [facility] Adult PT Treatment:                                                DATE: 05/09/23 Therapeutic Exercise: Review of hip/kneeHEP Updated HEP for shoulder Rows GTB Ext Red  IR and ER  red , left 10 x 2 for all above   Therapeutic Activity: 5 x STS with UE support : 15.3 sec  2 MWT 474 feet   [facility] Adult PT Treatment:                                                 DATE: 05/04/23 Therapeutic Exercise: Supine Piriformis Stretch x3 30" Supine Figure 4 Piriformis Stretch x3 30" Active Straight Leg Raise with Quad Set 2x10 Supine Bridge 2x10 BluTB Hooklying Clamshell 2x10 BluTB Supine March 2x10 BluTB Updated HEP  PATIENT EDUCATION: Education details: Eval findings, POC, HEP Person educated: Patient Education method: Explanation, Demonstration, Tactile cues, and Verbal cues Education comprehension: verbalized understanding, returned demonstration, verbal cues required, and tactile cues required  HOME EXERCISE PROGRAM: Access Code: [email or web address] URL: [email or web address] Date: 05/04/2023 Prepared by: Joellyn Rued  Exercises - Flexion-Extension Shoulder Pendulum with Table Support  - 3 x daily - 7 x weekly - 1 sets - 20 reps - Seated Piriformis Stretch  - 1 x daily - 7 x weekly - 1 sets - 3 reps - 30 hold - Seated Piriformis Stretch with Trunk Bend  - 1 x daily - 7 x weekly - 1 sets - 3 reps - 30 hold - Supine Piriformis Stretch with Foot on Ground  - 1 x daily - 7 x weekly - 1 sets - 3 reps - 30 hold - Supine Figure 4 Piriformis Stretch  - 1 x daily - 7 x weekly - 1 sets - 3 reps - 30 hold - Active Straight Leg Raise with Quad Set  - 1 x daily - 7 x weekly - 2 sets - 10 reps - 3 hold - Supine Bridge with Resistance Band  - 1 x daily - 7 x weekly - 2 sets - 10 reps - 2 hold - Hooklying Clamshell with Resistance  - 1 x daily - 7 x weekly - 2 sets - 10 reps - 2 hold - Supine March with Resistance Band  - 1 x daily - 7 x weekly - 2  sets - 10 reps - 2 hold Added 3/24 - Standing Bilateral Low Shoulder Row with Anchored Resistance  - 1 x daily - 7 x weekly - 2 sets - 10 reps - Shoulder extension with resistance - Neutral  - 1 x daily - 7 x weekly - 2 sets - 10 reps - Shoulder Internal Rotation with Resistance  - 1 x daily - 7 x weekly - 2 sets - 10 reps - Shoulder External Rotation with Anchored Resistance  - 1 x daily - 7 x weekly - 2 sets  - 10 reps ASSESSMENT:  CLINICAL IMPRESSION: Completed taping of both knees as above and afterward pt reported no knee pain c forward 8" step ups. Reassessed L shoulder flexion which was much improved meeting this goal. L shoulder resistance testing did not provoke L shoulder pain. Hawkins-Kennedy and palpation to the ant GH jt area are the only tests which provoked pt's pain. Iontophoresis was applied to the  anterior L GH jt. Pt tolerated PT today without adverse effects. Pt will continue to benefit from skilled PT to address impairments for improved function    Re-eval: Completed assessment for bilat legs. Knee pain appears OA related while the L hip seems most positive for tendinopathy. Therex were completed for hip mobility and for LE strengthening. A HEP was developed with a written program provided to the pt. Pt tolerated PT today without adverse effects. Pt will continue to benefit from skilled PT to address impairments for improved function with minimized pain. Will assess 5xSTS and the next PT session.  EVAL: Patient is a 53 y.o. 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   OBJECTIVE IMPAIRMENTS: decreased activity tolerance, decreased balance, difficulty walking, decreased strength, postural dysfunction, obesity, and pain.   ACTIVITY LIMITATIONS: carrying, lifting, bending, sitting, standing, squatting, sleeping, stairs, transfers, bathing, toileting, dressing, reach over head, locomotion level, and caring for others  PARTICIPATION LIMITATIONS: meal prep, cleaning, laundry, driving, shopping, community activity, and occupation  PERSONAL FACTORS: Fitness, Past/current experiences, Time since onset of  injury/illness/exacerbation, and 3+ comorbidities:    Obesity, fibromyalgia, headache syndrome, DDD, neck pain are also affecting patient's functional outcome.   REHAB POTENTIAL: Good  CLINICAL DECISION MAKING: Evolving/moderate complexity  EVALUATION COMPLEXITY: Moderate   GOALS:  SHORT TERM GOALS: Target date: 05/12/23  Pt will be Ind in an initial HEP  Baseline:started Goal status: INITIAL  2.  Pt will voice understanding of measures to assist in pain reduction  Baseline:  Goal status: INITIAL   LONG TERM GOALS: Target date: 07/01/23  Pt will be Ind in a final HEP to maintain achieved LOF  Baseline: started Goal status: INITIAL  2.  Pt will report 50% or greater improvement for her L shoulder for improved function and QOL. Baseline: 7-9/10 Goal status: INITIAL  3.  Pt will demonstrate the ability to lift 3# OH with good quality of movement for functional use with ADLs  and home activites Baseline: Unable Goal status: INITIAL  4.  Pt's Quick Dash score will improve by the MCID to 73% as indication of improved L shoulder function  Baseline: 89% Goal status: INITIAL  5. Improve 5xSTS by MCID of 5" and by MCID of 31ft as indication of improved functional mobility   Baseline: TBA  Goal Status: Initial  PLAN:  PT FREQUENCY: 2x/week  PT DURATION: 8 weeks  PLANNED INTERVENTIONS: 97164- PT Re-evaluation, 97110-Therapeutic exercises, 97530- Therapeutic activity, [id number]- Neuromuscular re-education, 97535- Self Care, 16109- Manual therapy, [id number]- Gait training, [phone]- Electrical stimulation (unattended), [phone]- Ionotophoresis 4mg /ml Dexamethasone, Balance training, Stair training, Taping, Dry Needling, Joint mobilization, Spinal mobilization, Cryotherapy, and Moist heat  PLAN FOR NEXT SESSION: Assess response to HEP; progress therex as indicated; use of modalities, manual therapy; and TPDN as indicated.   Terie Lear MS, PT 05/11/23 2:58 PM 1

## 2023-05-16 ENCOUNTER — Encounter: Payer: Self-pay | Admitting: Physical Therapy

## 2023-05-16 ENCOUNTER — Ambulatory Visit: Admitting: Physical Therapy

## 2023-05-16 DIAGNOSIS — G8929 Other chronic pain: Secondary | ICD-10-CM

## 2023-05-16 DIAGNOSIS — M25512 Pain in left shoulder: Secondary | ICD-10-CM | POA: Diagnosis not present

## 2023-05-16 NOTE — Therapy (Signed)
 OUTPATIENT PHYSICAL THERAPY SHOULDER TREATMENT/Lower Extremity TREATMENT    Patient Name: Brittney [name] MRN: [id number] DOB:05/04/1970, 53 y.o., female Today's Date: 05/16/2023  END OF SESSION:  PT End of Session - 05/16/23 1019     Visit Number 6    Number of Visits 17    Date for PT Re-Evaluation 07/01/23    Authorization Type [employer]; MEDICAID OF [place]- no auth reqd    PT Start Time 1018    PT Stop Time 1100    PT Time Calculation (min) 42 min               Past Medical History:  Diagnosis Date   Abscess    to eyelid   Anxiety    Arthritis    KNEES ELBOWS HIPS SHOULDER FINGERS   Asthma    Back pain    Bipolar disorder (HCC)    Depression    BIPOLAR   Dyspnea    Fibromyalgia    GERD (gastroesophageal reflux disease)    Headache syndrome 12/12/2019   Headache(784.0)    MIGRAINES   History of colonoscopy    History of degenerative disc disease    Hyperlipidemia    Major depressive disorder    Memory difficulties 06/21/2014   Neck pain    OA (osteoarthritis) of knee    Obesity    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   Rheumatoid arthritis (HCC)    Seizures (HCC)    FACIAL SEIZURES LAST 4 DAYS AGO   Past Surgical History:  Procedure Laterality Date   CARDIAC CATHETERIZATION  2010   DILATION AND CURETTAGE OF UTERUS  1994   LAPAROSCOPY N/A 11/21/2012   Procedure: LAPAROSCOPY OPERATIVE REMOVAL OF RIGHT TUBE AND OVARY AND FLUID FROM MASS;  Surgeon: Bing Plume, MD;  Location: [facility];  Service: Gynecology;  Laterality: N/A;  1 1/2hrs OR time   OOPHORECTOMY     Patient Active Problem List   Diagnosis Date Noted   Weakness of both legs 08/31/2021   Myalgia, multiple sites 08/31/2021   Back pain with radiculopathy 01/07/2021   Hyperlipidemia    OA (osteoarthritis) of knee    GERD (gastroesophageal reflux disease)    Rheumatoid arthritis (HCC)    Headache syndrome 12/12/2019   Bipolar 1 disorder, depressed, moderate (HCC) 12/14/2018    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   Fibromyalgia 02/03/2015   Tendinitis of both rotator cuffs 02/03/2015   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   Insomnia secondary to chronic pain 11/13/2014   Fatigue due to sleep pattern disturbance 11/13/2014   Memory difficulties 06/21/2014   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   PTSD (post-traumatic stress disorder) 12/13/2013   Bipolar depression (HCC) 12/12/2013   PERICARDIAL EFFUSION 12/23/2008   PSEUDOTUMOR CEREBRI 12/05/2008   UNSPECIFIED PLEURAL EFFUSION 12/05/2008   DYSPNEA 12/05/2008   OBESITY 11/19/2008   Migraine 11/19/2008   ASTHMA 11/19/2008   OTHER SPECIFIED DISORDER OF STOMACH AND DUODENUM 03/11/2008    PCP: Caesar Bookman, NP   REFERRING PROVIDER: Ngetich, Donalee Citrin, NP   REFERRING DIAG:  [phone] (ICD-10-CM) - Chronic left shoulder pain  M17.0 (ICD-10-CM) - Primary osteoarthritis of both knees  M05.751,M05.752 (ICD-10-CM) - 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 Continues to have anterior stabbing hip pain.     Pt requested her L shoulder pain be addressed first. Pt reports her L shoulder has been hurting since January when she started exercising more often with both aquatics and a trainer. Currently, she is not working with the trainer and she finds water exercises help to decrease the L shoulder pain. In 2019, she states she had a labral tear. Pt notes she is to receive a  cortisone injection for the L shoulder on Friday. Pt received cortisone injections of both knees yesterday and they feel much better. Overall, she reports her physical current issues are debilitating.  Hand dominance: Right  PERTINENT HISTORY: Obesity, fibromyalgia, headache syndrome, DDD, neck pain  PAIN:  Are you having pain? Yes: NPRS scale: 0/10. Pain range the week prior to start of PT:7-9/10 Pain location: Anterior L shoulder Pain description: burning, sharp Aggravating factors: Use of it Relieving factors: Water therapy  Knees: 0/10  L hip: 3/10  PRECAUTIONS: None  RED FLAGS: None   WEIGHT BEARING RESTRICTIONS: No  FALLS:  Has patient fallen in last 6 months? Yes. Number of falls 1 Sharp pain with feet and dropped  LIVING ENVIRONMENT: Lives with: lives with their family Lives in: House/apartment Able to access home  OCCUPATION: Geophysicist/field seismologist  PLOF: Independent  PATIENT GOALS:Pain relief, improved function  NEXT MD VISIT:   OBJECTIVE:  Note: Objective measures were completed at Evaluation unless otherwise noted.  DIAGNOSTIC FINDINGS:  Xray R shoulder 07/16/22 IMPRESSION: Mild to moderate glenohumeral joint degeneration. No acute osseous abnormality identified about the left shoulder.  Xray R knee 04/26/23 X-rays demonstrate moderate medial and patellofemoral degenerative changes  Xray L knee 04/26/23 X-rays demonstrate moderate medial and patellofemoral degenerative changes   PATIENT SURVEYS:  Quick Dash 47/55=89% disability  COGNITION: Overall cognitive status: Within functional limits for tasks assessed     SENSATION: WFL  POSTURE: Forward head and rounded shoulder  UPPER EXTREMITY ROM:   All L shoulder AROMs were painful with appropriate ROM. Pt completes at a slow pace Active ROM Right eval Left eval Left 05/02/23 Rt 05/11/23  Shoulder flexion  130 140 165  Shoulder extension      Shoulder abduction  130 160   Shoulder  adduction      Shoulder internal rotation  T9    Shoulder external rotation  T4    Elbow flexion      Elbow extension      Wrist flexion      Wrist extension      Wrist ulnar deviation      Wrist radial deviation      Wrist pronation      Wrist supination      (Blank rows = not tested)  UPPER EXTREMITY MMT:  L shoulder strength was 4 to 4+/5 in a neutral position without significant increase in pain MMT Right eval Left eval  Shoulder flexion    Shoulder extension    Shoulder abduction    Shoulder adduction    Shoulder internal rotation    Shoulder external rotation    Middle trapezius    Lower trapezius    Elbow flexion    Elbow extension    Wrist flexion    Wrist extension    Wrist ulnar deviation    Wrist radial deviation    Wrist pronation    Wrist supination  Grip strength (lbs)    (Blank rows = not tested)  SHOULDER SPECIAL TESTS: Impingement tests: Hawkins/Kennedy impingement test: negative SLAP lesions: Crank test: negative and Biceps load test: negative Rotator cuff assessment: Empty can test: negative and Full can test: negative Biceps assessment: Speed's test: negative  JOINT MOBILITY TESTING:  Stable GH jt  PALPATION:  TTP to the L upper trap  TTP to the L lateral greater trochanter LE Assessement LE ROM:  WNLS Active ROM Right Eval Left Eval  Hip flexion    Hip extension    Hip abduction    Hip adduction    Hip internal rotation    Hip external rotation    Knee flexion    Knee extension    Ankle dorsiflexion    Ankle plantarflexion    Ankle inversion    Ankle eversion     (Blank rows = not tested)  LE MMT:  MMT Right Eval Left Eval  Hip flexion 4+ 4  Hip extension 4 3  Hip abduction 4+ 4  Hip adduction    Hip internal rotation    Hip external rotation 4+ 4  Knee flexion 5 5  Knee extension 5 5  Ankle dorsiflexion    Ankle plantarflexion    Ankle inversion    Ankle eversion     (Blank rows = not tested)  LOWER  EXTREMITY SPECIAL TESTS:  Hip special tests: Luisa Hart (FABER) test: positive   FUNCTIONAL TESTS:  5 times sit to stand: TBA 2 minute walk test: TBA 05/09/23:  5 x STS with UE support : 15.3 sec  05/09/23: 474 feet   GAIT: Distance walked: 200' Assistive device utilized: None Level of assistance: Complete Independence Comments: WNLs                                                                                                                            TREATMENT DATE:  [facility] Adult PT Treatment:                                                DATE: 05/16/23 Therapeutic Exercise: Bridge  x 10 Single Leg bridge x 10 each  Bridge with staggered feet LLE back ROW blue bilat x 20 Ext Blue band x 20 ER Red 10 x 2 left IR Green 10 x 2 right  Therapeutic Activity: Mcconnel tape for bilat knees medial pull  Runners step ups with tape donned, no pain 10 x 2 each  Modalities: Iontophoresis to the L anterior GH jt area c 5mg /ml dexamethasone for 6 hours    [facility] Adult PT Treatment:  DATE: 05/11/22 Therapeutic Exercise: Supine Piriformis Stretch x3 30" Supine Figure 4 Piriformis Stretch x3 30" Active Straight Leg Raise with Quad Set 2x10 Active Straight Leg Raise in ER with Quad Set 2x10 Supine banded Bridge 2x10 BluTB Hooklying Clamshell 2x10 BluTB Supine March 2x10 BluTB Manual Therapy: Rock taping for both knees- taping patellas laterally approx 50% stretch Modalities: Iontophoresis to the L anterior GH jt area c 5mg /ml dexamethasone for 6 hours Advised not to get the patch wet and of possible skin irritation to the adhesive of the patch    [facility] Adult PT Treatment:                                                DATE: 05/09/23 Therapeutic Exercise: Review of hip/kneeHEP Updated HEP for shoulder Rows GTB Ext Red  IR and ER  red , left 10 x 2 for all above   Therapeutic Activity: 5 x STS with UE support : 15.3 sec  2 MWT 474 feet    [facility] Adult PT Treatment:                                                DATE: 05/04/23 Therapeutic Exercise: Supine Piriformis Stretch x3 30" Supine Figure 4 Piriformis Stretch x3 30" Active Straight Leg Raise with Quad Set 2x10 Supine Bridge 2x10 BluTB Hooklying Clamshell 2x10 BluTB Supine March 2x10 BluTB Updated HEP  PATIENT EDUCATION: Education details: Eval findings, POC, HEP Person educated: Patient Education method: Explanation, Demonstration, Tactile cues, and Verbal cues Education comprehension: verbalized understanding, returned demonstration, verbal cues required, and tactile cues required  HOME EXERCISE PROGRAM: Access Code: [email or web address] URL: [email or web address] Date: 05/04/2023 Prepared by: Joellyn Rued  Exercises - Flexion-Extension Shoulder Pendulum with Table Support  - 3 x daily - 7 x weekly - 1 sets - 20 reps - Seated Piriformis Stretch  - 1 x daily - 7 x weekly - 1 sets - 3 reps - 30 hold - Seated Piriformis Stretch with Trunk Bend  - 1 x daily - 7 x weekly - 1 sets - 3 reps - 30 hold - Supine Piriformis Stretch with Foot on Ground  - 1 x daily - 7 x weekly - 1 sets - 3 reps - 30 hold - Supine Figure 4 Piriformis Stretch  - 1 x daily - 7 x weekly - 1 sets - 3 reps - 30 hold - Active Straight Leg Raise with Quad Set  - 1 x daily - 7 x weekly - 2 sets - 10 reps - 3 hold - Supine Bridge with Resistance Band  - 1 x daily - 7 x weekly - 2 sets - 10 reps - 2 hold - Hooklying Clamshell with Resistance  - 1 x daily - 7 x weekly - 2 sets - 10 reps - 2 hold - Supine March with Resistance Band  - 1 x daily - 7 x weekly - 2 sets - 10 reps - 2 hold Added 3/24 - Standing Bilateral Low Shoulder Row with Anchored Resistance  - 1 x daily - 7 x weekly - 2 sets - 10 reps - Shoulder extension with resistance - Neutral  - 1 x daily - 7 x weekly - 2 sets - 10  reps - Shoulder Internal Rotation with Resistance  - 1 x daily - 7 x weekly - 2 sets - 10 reps - Shoulder  External Rotation with Anchored Resistance  - 1 x daily - 7 x weekly - 2 sets - 10 reps ASSESSMENT:  CLINICAL IMPRESSION: Pt reports ionto patch and knee tape were both helpful. She arrives without knee or shoulder pain, but has hip pain. She reports left hip gave way when she stood in church yesterday. Progressed gluteal strengthening with single leg bridge. Reapplied patella taping and repeated shoulder ionto patch as she reports both of these were helpful and she still has palpable tenderness in anterior shoulder.  Pt will continue to benefit from skilled PT to address impairments for improved function    Re-eval: Completed assessment for bilat legs. Knee pain appears OA related while the L hip seems most positive for tendinopathy. Therex were completed for hip mobility and for LE strengthening. A HEP was developed with a written program provided to the pt. Pt tolerated PT today without adverse effects. Pt will continue to benefit from skilled PT to address impairments for improved function with minimized pain. Will assess 5xSTS and the next PT session.  EVAL: Patient is a 53 y.o. 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   OBJECTIVE IMPAIRMENTS: decreased activity tolerance, decreased balance, difficulty walking, decreased strength, postural dysfunction, obesity, and pain.   ACTIVITY LIMITATIONS: carrying, lifting, bending, sitting, standing, squatting, sleeping, stairs, transfers, bathing, toileting, dressing, reach over head, locomotion level, and caring for others  PARTICIPATION LIMITATIONS: meal prep, cleaning, laundry, driving, shopping, community activity, and occupation  PERSONAL FACTORS: Fitness, Past/current experiences,  Time since onset of injury/illness/exacerbation, and 3+ comorbidities:    Obesity, fibromyalgia, headache syndrome, DDD, neck pain are also affecting patient's functional outcome.   REHAB POTENTIAL: Good  CLINICAL DECISION MAKING: Evolving/moderate complexity  EVALUATION COMPLEXITY: Moderate   GOALS:  SHORT TERM GOALS: Target date: 05/12/23  Pt will be Ind in an initial HEP  Baseline:started Goal status: INITIAL  2.  Pt will voice understanding of measures to assist in pain reduction  Baseline:  Goal status: INITIAL   LONG TERM GOALS: Target date: 07/01/23  Pt will be Ind in a final HEP to maintain achieved LOF  Baseline: started Goal status: INITIAL  2.  Pt will report 50% or greater improvement for her L shoulder for improved function and QOL. Baseline: 7-9/10 Goal status: INITIAL  3.  Pt will demonstrate the ability to lift 3# OH with good quality of movement for functional use with ADLs  and home activites Baseline: Unable Goal status: INITIAL  4.  Pt's Quick Dash score will improve by the MCID to 73% as indication of improved L shoulder function  Baseline: 89% Goal status: INITIAL  5. Improve 5xSTS by MCID of 5" and by MCID of 40ft as indication of improved functional mobility   Baseline: TBA  Goal Status: Initial  PLAN:  PT FREQUENCY: 2x/week  PT DURATION: 8 weeks  PLANNED INTERVENTIONS: 97164- PT Re-evaluation, 97110-Therapeutic exercises, 97530- Therapeutic activity, 97112- Neuromuscular re-education, 97535- Self Care, 16109- Manual therapy, [id number]- Gait training, [phone]- Electrical stimulation (unattended), [phone]- Ionotophoresis 4mg /ml Dexamethasone, Balance training, Stair training, Taping, Dry Needling, Joint mobilization, Spinal mobilization, Cryotherapy, and Moist heat  PLAN FOR NEXT SESSION: Assess response to HEP; progress therex as indicated; use of modalities, manual therapy; and TPDN as indicated.   Jannette Spanner, PTA 05/16/23 12:25  PM Phone: [phone] Fax: [phone]

## 2023-05-18 ENCOUNTER — Encounter

## 2023-05-19 ENCOUNTER — Encounter: Payer: Self-pay | Admitting: Physical Therapy

## 2023-05-19 ENCOUNTER — Ambulatory Visit: Attending: Family | Admitting: Physical Therapy

## 2023-05-19 DIAGNOSIS — M25552 Pain in left hip: Secondary | ICD-10-CM | POA: Diagnosis present

## 2023-05-19 DIAGNOSIS — G8929 Other chronic pain: Secondary | ICD-10-CM | POA: Insufficient documentation

## 2023-05-19 DIAGNOSIS — M25551 Pain in right hip: Secondary | ICD-10-CM | POA: Diagnosis present

## 2023-05-19 DIAGNOSIS — M6281 Muscle weakness (generalized): Secondary | ICD-10-CM | POA: Diagnosis present

## 2023-05-19 DIAGNOSIS — R262 Difficulty in walking, not elsewhere classified: Secondary | ICD-10-CM | POA: Diagnosis present

## 2023-05-19 DIAGNOSIS — M25562 Pain in left knee: Secondary | ICD-10-CM | POA: Diagnosis present

## 2023-05-19 DIAGNOSIS — M25512 Pain in left shoulder: Secondary | ICD-10-CM | POA: Diagnosis present

## 2023-05-19 DIAGNOSIS — M25561 Pain in right knee: Secondary | ICD-10-CM | POA: Insufficient documentation

## 2023-05-19 NOTE — Therapy (Unsigned)
 OUTPATIENT PHYSICAL THERAPY SHOULDER TREATMENT/Lower Extremity TREATMENT    Patient Name: Brittney [name] MRN: [id number] DOB:1970-10-04, 53 y.o., female Today's Date: 05/20/2023  END OF SESSION:  PT End of Session - 05/19/23 1442     Visit Number 7    Number of Visits 17    Date for PT Re-Evaluation 07/01/23    Authorization Type [employer]; MEDICAID OF [place]- no auth reqd    PT Start Time 0245    PT Stop Time 0325    PT Time Calculation (min) 40 min                Past Medical History:  Diagnosis Date   Abscess    to eyelid   Anxiety    Arthritis    KNEES ELBOWS HIPS SHOULDER FINGERS   Asthma    Back pain    Bipolar disorder (HCC)    Depression    BIPOLAR   Dyspnea    Fibromyalgia    GERD (gastroesophageal reflux disease)    Headache syndrome 12/12/2019   Headache(784.0)    MIGRAINES   History of colonoscopy    History of degenerative disc disease    Hyperlipidemia    Major depressive disorder    Memory difficulties 06/21/2014   Neck pain    OA (osteoarthritis) of knee    Obesity    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   Rheumatoid arthritis (HCC)    Seizures (HCC)    FACIAL SEIZURES LAST 4 DAYS AGO   Past Surgical History:  Procedure Laterality Date   CARDIAC CATHETERIZATION  2010   DILATION AND CURETTAGE OF UTERUS  1994   LAPAROSCOPY N/A 11/21/2012   Procedure: LAPAROSCOPY OPERATIVE REMOVAL OF RIGHT TUBE AND OVARY AND FLUID FROM MASS;  Surgeon: Bing Plume, MD;  Location: [facility];  Service: Gynecology;  Laterality: N/A;  1 1/2hrs OR time   OOPHORECTOMY     Patient Active Problem List   Diagnosis Date Noted   Weakness of both legs 08/31/2021   Myalgia, multiple sites 08/31/2021   Back pain with radiculopathy 01/07/2021   Hyperlipidemia    OA (osteoarthritis) of knee    GERD (gastroesophageal reflux disease)    Rheumatoid arthritis (HCC)    Headache syndrome 12/12/2019   Bipolar 1 disorder, depressed, moderate (HCC) 12/14/2018    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   Fibromyalgia 02/03/2015   Tendinitis of both rotator cuffs 02/03/2015   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   Insomnia secondary to chronic pain 11/13/2014   Fatigue due to sleep pattern disturbance 11/13/2014   Memory difficulties 06/21/2014   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   PTSD (post-traumatic stress disorder) 12/13/2013   Bipolar depression (HCC) 12/12/2013   PERICARDIAL EFFUSION 12/23/2008   PSEUDOTUMOR CEREBRI 12/05/2008   UNSPECIFIED PLEURAL EFFUSION 12/05/2008   DYSPNEA 12/05/2008   OBESITY 11/19/2008   Migraine 11/19/2008   ASTHMA 11/19/2008   OTHER SPECIFIED DISORDER OF STOMACH AND DUODENUM 03/11/2008    PCP: Caesar Bookman, NP   REFERRING PROVIDER: Ngetich, Donalee Citrin, NP   REFERRING DIAG:  [phone] (ICD-10-CM) - Chronic left shoulder pain  M17.0 (ICD-10-CM) - Primary osteoarthritis of both knees  M05.751,M05.752 (ICD-10-CM) - 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    Pt requested her L shoulder pain be addressed first. Pt reports her L shoulder has been hurting since January when she started exercising more often with both aquatics and a trainer. Currently, she is not working with the trainer and she finds water exercises help to decrease the L shoulder pain. In 2019, she states she had a labral tear. Pt notes she is to receive a  cortisone injection for the L shoulder on Friday. Pt received cortisone injections of both knees yesterday and they feel much better. Overall, she reports her physical current issues are debilitating.  Hand dominance: Right  PERTINENT HISTORY: Obesity, fibromyalgia, headache syndrome, DDD, neck pain  PAIN:  Are you having pain? Yes: NPRS scale: 0/10. Pain range the week prior to start of PT:7-9/10 Pain location: Anterior L shoulder Pain description: burning, sharp Aggravating factors: Use of it Relieving factors: Water therapy  Knees: 0/10  L hip: 3/10  PRECAUTIONS: None  RED FLAGS: None   WEIGHT BEARING RESTRICTIONS: No  FALLS:  Has patient fallen in last 6 months? Yes. Number of falls 1 Sharp pain with feet and dropped  LIVING ENVIRONMENT: Lives with: lives with their family Lives in: House/apartment Able to access home  OCCUPATION: Geophysicist/field seismologist  PLOF: Independent  PATIENT GOALS:Pain relief, improved function  NEXT MD VISIT:   OBJECTIVE:  Note: Objective measures were completed at Evaluation unless otherwise noted.  DIAGNOSTIC FINDINGS:  Xray R shoulder 07/16/22 IMPRESSION: Mild to moderate glenohumeral joint degeneration. No acute osseous abnormality identified about the left shoulder.  Xray R knee 04/26/23 X-rays demonstrate moderate medial and patellofemoral degenerative changes  Xray L knee 04/26/23 X-rays demonstrate moderate medial and patellofemoral degenerative changes   PATIENT SURVEYS:  Quick Dash 47/55=89% disability  COGNITION: Overall cognitive status: Within functional limits for tasks assessed     SENSATION: WFL  POSTURE: Forward head and rounded shoulder  UPPER EXTREMITY ROM:   All L shoulder AROMs were painful with appropriate ROM. Pt completes at a slow pace Active ROM Right eval Left eval Left 05/02/23 Rt 05/11/23  Shoulder flexion  130 140 165  Shoulder extension      Shoulder abduction  130 160   Shoulder  adduction      Shoulder internal rotation  T9    Shoulder external rotation  T4    Elbow  flexion      Elbow extension      Wrist flexion      Wrist extension      Wrist ulnar deviation      Wrist radial deviation      Wrist pronation      Wrist supination      (Blank rows = not tested)  UPPER EXTREMITY MMT:  L shoulder strength was 4 to 4+/5 in a neutral position without significant increase in pain MMT Right eval Left eval  Shoulder flexion    Shoulder extension    Shoulder abduction    Shoulder adduction    Shoulder internal rotation    Shoulder external rotation    Middle trapezius    Lower trapezius    Elbow flexion    Elbow extension    Wrist flexion    Wrist extension    Wrist ulnar deviation    Wrist radial deviation    Wrist pronation    Wrist supination    Grip strength (lbs)    (Blank rows = not tested)  SHOULDER SPECIAL TESTS: Impingement tests: Hawkins/Kennedy impingement test: negative SLAP lesions: Crank test: negative and Biceps load test: negative Rotator cuff assessment: Empty can test: negative and Full can test: negative Biceps assessment: Speed's test: negative  JOINT MOBILITY TESTING:  Stable GH jt  PALPATION:  TTP to the L upper trap  TTP to the L lateral greater trochanter LE Assessement LE ROM:  WNLS Active ROM Right Eval Left Eval  Hip flexion    Hip extension    Hip abduction    Hip adduction    Hip internal rotation    Hip external rotation    Knee flexion    Knee extension    Ankle dorsiflexion    Ankle plantarflexion    Ankle inversion    Ankle eversion     (Blank rows = not tested)  LE MMT:  MMT Right Eval Left Eval  Hip flexion 4+ 4  Hip extension 4 3  Hip abduction 4+ 4  Hip adduction    Hip internal rotation    Hip external rotation 4+ 4  Knee flexion 5 5  Knee extension 5 5  Ankle dorsiflexion    Ankle plantarflexion    Ankle inversion    Ankle eversion     (Blank rows = not tested)  LOWER  EXTREMITY SPECIAL TESTS:  Hip special tests: Luisa Hart (FABER) test: positive   FUNCTIONAL TESTS:  5 times sit to stand: TBA 2 minute walk test: TBA 05/09/23:  5 x STS with UE support : 15.3 sec  05/09/23: 474 feet   GAIT: Distance walked: 200' Assistive device utilized: None Level of assistance: Complete Independence Comments: WNLs                                                                                                                            TREATMENT DATE:   TREATMENT 05/19/23:  Aquatic therapy at  [facility]- [address] - therapeutic pool temp 92 degrees Pt enters building independently.  Treatment took place in water 3.8 to  4 ft 8 in.feet deep depending upon activity.  Pt entered and exited the pool via stair and handrails    Aquatic Therapy:  Water walking for warm up fwd/lat/bkwds  Squats Hip abd Heel raise Walking march Hip flexion ext - knee straight Steps ups fwd and lat Lateral walking with squats and shoulder adduction with rainbow DB Yellow noodle stomp - 2x10  Pt requires the buoyancy of water for active assisted exercises with buoyancy supported for strengthening and AROM exercises. Hydrostatic pressure also supports joints by unweighting joint load by at least 50 % in 3-4 feet depth water. 80% in chest to neck deep water. Water will provide assistance with movement using the current and laminar flow while the buoyancy reduces weight bearing. Pt requires the viscosity of the water for resistance with strengthening exercises.   [facility] Adult PT Treatment:                                                DATE: 05/16/23 Therapeutic Exercise: Bridge  x 10 Single Leg bridge x 10 each  Bridge with staggered feet LLE back ROW blue bilat x 20 Ext Blue band x 20 ER Red 10 x 2 left IR Green 10 x 2 right  Therapeutic Activity: Mcconnel tape for bilat knees medial pull  Runners step ups with tape donned, no pain 10 x 2 each  Modalities: Iontophoresis to  the L anterior GH jt area c 5mg /ml dexamethasone for 6 hours    [facility] Adult PT Treatment:                                                DATE: 05/11/22 Therapeutic Exercise: Supine Piriformis Stretch x3 30" Supine Figure 4 Piriformis Stretch x3 30" Active Straight Leg Raise with Quad Set 2x10 Active Straight Leg Raise in ER with Quad Set 2x10 Supine banded Bridge 2x10 BluTB Hooklying Clamshell 2x10 BluTB Supine March 2x10 BluTB Manual Therapy: Rock taping for both knees- taping patellas laterally approx 50% stretch Modalities: Iontophoresis to the L anterior GH jt area c 5mg /ml dexamethasone for 6 hours Advised not to get the patch wet and of possible skin irritation to the adhesive of the patch    [facility] Adult PT Treatment:                                                DATE: 05/09/23 Therapeutic Exercise: Review of hip/kneeHEP Updated HEP for shoulder Rows GTB Ext Red  IR and ER  red , left 10 x 2 for all above   Therapeutic Activity: 5 x STS with UE support : 15.3 sec  2 MWT 474 feet   [facility] Adult PT Treatment:  DATE: 05/04/23 Therapeutic Exercise: Supine Piriformis Stretch x3 30" Supine Figure 4 Piriformis Stretch x3 30" Active Straight Leg Raise with Quad Set 2x10 Supine Bridge 2x10 BluTB Hooklying Clamshell 2x10 BluTB Supine March 2x10 BluTB Updated HEP  PATIENT EDUCATION: Education details: Eval findings, POC, HEP Person educated: Patient Education method: Explanation, Demonstration, Tactile cues, and Verbal cues Education comprehension: verbalized understanding, returned demonstration, verbal cues required, and tactile cues required  HOME EXERCISE PROGRAM: Access Code: [email or web address] URL: [email or web address] Date: 05/04/2023 Prepared by: Joellyn Rued  Exercises - Flexion-Extension Shoulder Pendulum with Table Support  - 3 x daily - 7 x weekly - 1 sets - 20 reps - Seated Piriformis Stretch  - 1 x daily - 7  x weekly - 1 sets - 3 reps - 30 hold - Seated Piriformis Stretch with Trunk Bend  - 1 x daily - 7 x weekly - 1 sets - 3 reps - 30 hold - Supine Piriformis Stretch with Foot on Ground  - 1 x daily - 7 x weekly - 1 sets - 3 reps - 30 hold - Supine Figure 4 Piriformis Stretch  - 1 x daily - 7 x weekly - 1 sets - 3 reps - 30 hold - Active Straight Leg Raise with Quad Set  - 1 x daily - 7 x weekly - 2 sets - 10 reps - 3 hold - Supine Bridge with Resistance Band  - 1 x daily - 7 x weekly - 2 sets - 10 reps - 2 hold - Hooklying Clamshell with Resistance  - 1 x daily - 7 x weekly - 2 sets - 10 reps - 2 hold - Supine March with Resistance Band  - 1 x daily - 7 x weekly - 2 sets - 10 reps - 2 hold Added 3/24 - Standing Bilateral Low Shoulder Row with Anchored Resistance  - 1 x daily - 7 x weekly - 2 sets - 10 reps - Shoulder extension with resistance - Neutral  - 1 x daily - 7 x weekly - 2 sets - 10 reps - Shoulder Internal Rotation with Resistance  - 1 x daily - 7 x weekly - 2 sets - 10 reps - Shoulder External Rotation with Anchored Resistance  - 1 x daily - 7 x weekly - 2 sets - 10 reps ASSESSMENT:  CLINICAL IMPRESSION: Session today focused on hip and core strengthening in the aquatic environment for use of buoyancy to offload joints and the viscosity of water as resistance during therapeutic exercise.  Pt with CC of L hip pain today.  She does report increase in L hip pain with several exercises and these were modified with reduced ROM or d/c'd with an alternate exercise selected..  Patient was able to tolerate all prescribed exercises in the aquatic environment with no adverse effects and reports 2/10 pain at the end of the session. Patient continues to benefit from skilled PT services on land and aquatic based and should be progressed as able to improve functional independence.   Re-eval: Completed assessment for bilat legs. Knee pain appears OA related while the L hip seems most positive for  tendinopathy. Therex were completed for hip mobility and for LE strengthening. A HEP was developed with a written program provided to the pt. Pt tolerated PT today without adverse effects. Pt will continue to benefit from skilled PT to address impairments for improved function with minimized pain. Will assess 5xSTS and the next PT session.  EVAL: Patient is a 53 y.o.  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   OBJECTIVE IMPAIRMENTS: decreased activity tolerance, decreased balance, difficulty walking, decreased strength, postural dysfunction, obesity, and pain.   ACTIVITY LIMITATIONS: carrying, lifting, bending, sitting, standing, squatting, sleeping, stairs, transfers, bathing, toileting, dressing, reach over head, locomotion level, and caring for others  PARTICIPATION LIMITATIONS: meal prep, cleaning, laundry, driving, shopping, community activity, and occupation  PERSONAL FACTORS: Fitness, Past/current experiences, Time since onset of injury/illness/exacerbation, and 3+ comorbidities:    Obesity, fibromyalgia, headache syndrome, DDD, neck pain are also affecting patient's functional outcome.   REHAB POTENTIAL: Good  CLINICAL DECISION MAKING: Evolving/moderate complexity  EVALUATION COMPLEXITY: Moderate   GOALS:  SHORT TERM GOALS: Target date: 05/12/23  Pt will be Ind in an initial HEP  Baseline:started Goal status: MET  2.  Pt will voice understanding of measures to assist in pain reduction  Baseline:  Goal status: MET   LONG TERM GOALS: Target date: 07/01/23  Pt will be Ind in a final HEP to maintain achieved LOF  Baseline: started Goal status: INITIAL  2.  Pt will report 50% or greater improvement for her L  shoulder for improved function and QOL. Baseline: 7-9/10 Goal status: INITIAL  3.  Pt will demonstrate the ability to lift 3# OH with good quality of movement for functional use with ADLs  and home activites Baseline: Unable Goal status: INITIAL  4.  Pt's Quick Dash score will improve by the MCID to 73% as indication of improved L shoulder function  Baseline: 89% Goal status: INITIAL  5. Improve 5xSTS by MCID of 5" and by MCID of 19ft as indication of improved functional mobility   Baseline: TBA  Goal Status: Initial  PLAN:  PT FREQUENCY: 2x/week  PT DURATION: 8 weeks  PLANNED INTERVENTIONS: 97164- PT Re-evaluation, 97110-Therapeutic exercises, 97530- Therapeutic activity, [id number]- Neuromuscular re-education, 97535- Self Care, [id number]- Manual therapy, [id number]- Gait training, [phone]- Electrical stimulation (unattended), [phone]- Ionotophoresis 4mg /ml Dexamethasone, Balance training, Stair training, Taping, Dry Needling, Joint mobilization, Spinal mobilization, Cryotherapy, and Moist heat  PLAN FOR NEXT SESSION: Assess response to HEP; progress therex as indicated; use of modalities, manual therapy; and TPDN as indicated.   Kimberlee Nearing Jaxiel Kines PT 05/20/23 7:45 AM Phone: [phone] Fax: [phone]

## 2023-05-23 ENCOUNTER — Encounter: Payer: Self-pay | Admitting: Physical Therapy

## 2023-05-23 ENCOUNTER — Ambulatory Visit: Admitting: Physical Therapy

## 2023-05-23 DIAGNOSIS — M25512 Pain in left shoulder: Secondary | ICD-10-CM | POA: Diagnosis not present

## 2023-05-23 DIAGNOSIS — M25552 Pain in left hip: Secondary | ICD-10-CM

## 2023-05-23 DIAGNOSIS — G8929 Other chronic pain: Secondary | ICD-10-CM

## 2023-05-23 NOTE — Therapy (Signed)
 OUTPATIENT PHYSICAL THERAPY SHOULDER TREATMENT/Lower Extremity TREATMENT    Patient Name: Brittney [name] MRN: [id number] DOB:07-08-1970, 53 y.o., female Today's Date: 05/23/2023  END OF SESSION:  PT End of Session - 05/23/23 1020     Visit Number 8    Number of Visits 17    Date for PT Re-Evaluation 07/01/23    Authorization Type [employer]; MEDICAID OF [place]- no auth reqd    PT Start Time 1018    PT Stop Time 1110    PT Time Calculation (min) 52 min                Past Medical History:  Diagnosis Date   Abscess    to eyelid   Anxiety    Arthritis    KNEES ELBOWS HIPS SHOULDER FINGERS   Asthma    Back pain    Bipolar disorder (HCC)    Depression    BIPOLAR   Dyspnea    Fibromyalgia    GERD (gastroesophageal reflux disease)    Headache syndrome 12/12/2019   Headache(784.0)    MIGRAINES   History of colonoscopy    History of degenerative disc disease    Hyperlipidemia    Major depressive disorder    Memory difficulties 06/21/2014   Neck pain    OA (osteoarthritis) of knee    Obesity    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   Rheumatoid arthritis (HCC)    Seizures (HCC)    FACIAL SEIZURES LAST 4 DAYS AGO   Past Surgical History:  Procedure Laterality Date   CARDIAC CATHETERIZATION  2010   DILATION AND CURETTAGE OF UTERUS  1994   LAPAROSCOPY N/A 11/21/2012   Procedure: LAPAROSCOPY OPERATIVE REMOVAL OF RIGHT TUBE AND OVARY AND FLUID FROM MASS;  Surgeon: Bing Plume, MD;  Location: [facility];  Service: Gynecology;  Laterality: N/A;  1 1/2hrs OR time   OOPHORECTOMY     Patient Active Problem List   Diagnosis Date Noted   Weakness of both legs 08/31/2021   Myalgia, multiple sites 08/31/2021   Back pain with radiculopathy 01/07/2021   Hyperlipidemia    OA (osteoarthritis) of knee    GERD (gastroesophageal reflux disease)    Rheumatoid arthritis (HCC)    Headache syndrome 12/12/2019   Bipolar 1 disorder, depressed, moderate (HCC) 12/14/2018    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   Fibromyalgia 02/03/2015   Tendinitis of both rotator cuffs 02/03/2015   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   Insomnia secondary to chronic pain 11/13/2014   Fatigue due to sleep pattern disturbance 11/13/2014   Memory difficulties 06/21/2014   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   PTSD (post-traumatic stress disorder) 12/13/2013   Bipolar depression (HCC) 12/12/2013   PERICARDIAL EFFUSION 12/23/2008   PSEUDOTUMOR CEREBRI 12/05/2008   UNSPECIFIED PLEURAL EFFUSION 12/05/2008   DYSPNEA 12/05/2008   OBESITY 11/19/2008   Migraine 11/19/2008   ASTHMA 11/19/2008   OTHER SPECIFIED DISORDER OF STOMACH AND DUODENUM 03/11/2008    PCP: Caesar Bookman, NP   REFERRING PROVIDER: Ngetich, Donalee Citrin, NP   REFERRING DIAG:  [phone] (ICD-10-CM) - Chronic left shoulder pain  M17.0 (ICD-10-CM) - Primary osteoarthritis of both knees  M05.751,M05.752 (ICD-10-CM) - 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 My knees and shoulder are improving. I think I am going to get an injection in my hip so I can exercise.     Pt requested her L shoulder pain be addressed first. Pt reports her L shoulder has been hurting since January when she started exercising more often with both aquatics and a trainer. Currently, she is not working with the trainer and she finds water exercises help to decrease the L shoulder pain. In 2019, she  states she had a labral tear. Pt notes she is to receive a cortisone injection for the L shoulder on Friday. Pt received cortisone injections of both knees yesterday and they feel much better. Overall, she reports her physical current issues are debilitating.  Hand dominance: Right  PERTINENT HISTORY: Obesity, fibromyalgia, headache syndrome, DDD, neck pain  PAIN:  Are you having pain? Yes: NPRS scale: 0/10. Pain range the week prior to start of PT:7-9/10 Pain location: Anterior L shoulder Pain description: burning, sharp Aggravating factors: Use of it Relieving factors: Water therapy  Knees: 0/10  L hip: 3/10  PRECAUTIONS: None  RED FLAGS: None   WEIGHT BEARING RESTRICTIONS: No  FALLS:  Has patient fallen in last 6 months? Yes. Number of falls 1 Sharp pain with feet and dropped  LIVING ENVIRONMENT: Lives with: lives with their family Lives in: House/apartment Able to access home  OCCUPATION: Geophysicist/field seismologist  PLOF: Independent  PATIENT GOALS:Pain relief, improved function  NEXT MD VISIT:   OBJECTIVE:  Note: Objective measures were completed at Evaluation unless otherwise noted.  DIAGNOSTIC FINDINGS:  Xray R shoulder 07/16/22 IMPRESSION: Mild to moderate glenohumeral joint degeneration. No acute osseous abnormality identified about the left shoulder.  Xray R knee 04/26/23 X-rays demonstrate moderate medial and patellofemoral degenerative changes  Xray L knee 04/26/23 X-rays demonstrate moderate medial and patellofemoral degenerative changes   PATIENT SURVEYS:  Quick Dash 47/55=89% disability  COGNITION: Overall cognitive status: Within functional limits for tasks assessed     SENSATION: WFL  POSTURE: Forward head and rounded shoulder  UPPER EXTREMITY ROM:   All L shoulder AROMs were painful with appropriate ROM. Pt completes at a slow pace Active ROM Right eval Left eval Left 05/02/23 LT 05/11/23 Lt 05/23/23  Shoulder flexion  130 140 165 165   Shoulder extension       Shoulder abduction  130 160  165 painful arc  Shoulder adduction  Shoulder internal rotation  T9     Shoulder external rotation  T4     Elbow flexion       Elbow extension       Wrist flexion       Wrist extension       Wrist ulnar deviation       Wrist radial deviation       Wrist pronation       Wrist supination       (Blank rows = not tested)  UPPER EXTREMITY MMT:  L shoulder strength was 4 to 4+/5 in a neutral position without significant increase in pain MMT Right eval Left eval  Shoulder flexion    Shoulder extension    Shoulder abduction    Shoulder adduction    Shoulder internal rotation    Shoulder external rotation    Middle trapezius    Lower trapezius    Elbow flexion    Elbow extension    Wrist flexion    Wrist extension    Wrist ulnar deviation    Wrist radial deviation    Wrist pronation    Wrist supination    Grip strength (lbs)    (Blank rows = not tested)  SHOULDER SPECIAL TESTS: Impingement tests: Hawkins/Kennedy impingement test: negative SLAP lesions: Crank test: negative and Biceps load test: negative Rotator cuff assessment: Empty can test: negative and Full can test: negative Biceps assessment: Speed's test: negative  JOINT MOBILITY TESTING:  Stable GH jt  PALPATION:  TTP to the L upper trap  TTP to the L lateral greater trochanter LE Assessement LE ROM:  WNLS Active ROM Right Eval Left Eval  Hip flexion    Hip extension    Hip abduction    Hip adduction    Hip internal rotation    Hip external rotation    Knee flexion    Knee extension    Ankle dorsiflexion    Ankle plantarflexion    Ankle inversion    Ankle eversion     (Blank rows = not tested)  LE MMT:  MMT Right Eval Left Eval  Hip flexion 4+ 4  Hip extension 4 3  Hip abduction 4+ 4  Hip adduction    Hip internal rotation    Hip external rotation 4+ 4  Knee flexion 5 5  Knee extension 5 5  Ankle dorsiflexion    Ankle  plantarflexion    Ankle inversion    Ankle eversion     (Blank rows = not tested)  LOWER EXTREMITY SPECIAL TESTS:  Hip special tests: Luisa Hart (FABER) test: positive   FUNCTIONAL TESTS:  5 times sit to stand: TBA 2 minute walk test: TBA 05/09/23:  5 x STS with UE support : 15.3 sec  05/09/23: 474 feet   GAIT: Distance walked: 200' Assistive device utilized: None Level of assistance: Complete Independence Comments: WNLs  TREATMENT DATE:  [facility] Adult PT Treatment:                                                DATE: 05/23/23 Therapeutic Exercise: Side shoulder abduction 10 x 2 +HEP Supine shoulder horizontal abduction GTB 10 x 2  Bridge (SL) 10 x 2 each +HEP Side lying Hip abduction x 10 each  STS x 10 -R knee pain  LAQ GTB 10 x 2 each +HEP Standing hip abduction AROM, then added red band - increased pain left hip Shoulder Row black  Shoulder ext Green  Shoulder ER/IR GTB x 10 each Updated HEP  Modalities: Ice pack Left shoulder x 10 minutes      TREATMENT 05/19/23:  Aquatic therapy at [facility]- [address] - therapeutic pool temp 92 degrees Pt enters building independently.  Treatment took place in water 3.8 to  4 ft 8 in.feet deep depending upon activity.  Pt entered and exited the pool via stair and handrails    Aquatic Therapy:  Water walking for warm up fwd/lat/bkwds  Squats Hip abd Heel raise Walking march Hip flexion ext - knee straight Steps ups fwd and lat Lateral walking with squats and shoulder adduction with rainbow DB Yellow noodle stomp - 2x10  Pt requires the buoyancy of water for active assisted exercises with buoyancy supported for strengthening and AROM exercises. Hydrostatic pressure also supports joints by unweighting joint load by at least 50 % in 3-4 feet depth water. 80% in chest to neck deep water. Water will  provide assistance with movement using the current and laminar flow while the buoyancy reduces weight bearing. Pt requires the viscosity of the water for resistance with strengthening exercises.   [facility] Adult PT Treatment:                                                DATE: 05/16/23 Therapeutic Exercise: Bridge  x 10 Single Leg bridge x 10 each  Bridge with staggered feet LLE back ROW blue bilat x 20 Ext Blue band x 20 ER Red 10 x 2 left IR Green 10 x 2 right  Therapeutic Activity: Mcconnel tape for bilat knees medial pull  Runners step ups with tape donned, no pain 10 x 2 each  Modalities: Iontophoresis to the L anterior GH jt area c 5mg /ml dexamethasone for 6 hours    [facility] Adult PT Treatment:                                                DATE: 05/11/22 Therapeutic Exercise: Supine Piriformis Stretch x3 30" Supine Figure 4 Piriformis Stretch x3 30" Active Straight Leg Raise with Quad Set 2x10 Active Straight Leg Raise in ER with Quad Set 2x10 Supine banded Bridge 2x10 BluTB Hooklying Clamshell 2x10 BluTB Supine March 2x10 BluTB Manual Therapy: Rock taping for both knees- taping patellas laterally approx 50% stretch Modalities: Iontophoresis to the L anterior GH jt area c 5mg /ml dexamethasone for 6 hours Advised not to get the patch wet and of possible skin irritation to the adhesive of the patch  [facility] Adult PT Treatment:                                                DATE: 05/09/23 Therapeutic Exercise: Review of hip/kneeHEP Updated HEP for shoulder Rows GTB Ext Red  IR and ER  red , left 10 x 2 for all above   Therapeutic Activity: 5 x STS with UE support : 15.3 sec  2 MWT 474 feet   [facility] Adult PT Treatment:                                                DATE: 05/04/23 Therapeutic Exercise: Supine Piriformis Stretch x3 30" Supine Figure 4 Piriformis Stretch x3 30" Active Straight Leg Raise with Quad Set 2x10 Supine Bridge 2x10 BluTB Hooklying Clamshell 2x10  BluTB Supine March 2x10 BluTB Updated HEP  PATIENT EDUCATION: Education details: Eval findings, POC, HEP Person educated: Patient Education method: Explanation, Demonstration, Tactile cues, and Verbal cues Education comprehension: verbalized understanding, returned demonstration, verbal cues required, and tactile cues required  HOME EXERCISE PROGRAM: Access Code: [email or web address] URL: [email or web address] Date: 05/04/2023 Prepared by: Joellyn Rued  Exercises - Flexion-Extension Shoulder Pendulum with Table Support  - 3 x daily - 7 x weekly - 1 sets - 20 reps - Seated Piriformis Stretch  - 1 x daily - 7 x weekly - 1 sets - 3 reps - 30 hold - Seated Piriformis Stretch with Trunk Bend  - 1 x daily - 7 x weekly - 1 sets - 3 reps - 30 hold - Supine Piriformis Stretch with Foot on Ground  - 1 x daily - 7 x weekly - 1 sets - 3 reps - 30 hold - Supine Figure 4 Piriformis Stretch  - 1 x daily - 7 x weekly - 1 sets - 3 reps - 30 hold - Active Straight Leg Raise with Quad Set  - 1 x daily - 7 x weekly - 2 sets - 10 reps - 3 hold - Supine Bridge with Resistance Band  - 1 x daily - 7 x weekly - 2 sets - 10 reps - 2 hold - Hooklying Clamshell with Resistance  - 1 x daily - 7 x weekly - 2 sets - 10 reps - 2 hold - Supine March with Resistance Band  - 1 x daily - 7 x weekly - 2 sets - 10 reps - 2 hold Added 3/24 - Standing Bilateral Low Shoulder Row with Anchored Resistance  - 1 x daily - 7 x weekly - 2 sets - 10 reps - Shoulder extension with resistance - Neutral  - 1 x daily - 7 x weekly - 2 sets - 10 reps - Shoulder Internal Rotation with Resistance  - 1 x daily - 7 x weekly - 2 sets - 10 reps - Shoulder External Rotation with Anchored Resistance  - 1 x daily - 7 x weekly - 2 sets - 10 reps ASSESSMENT:  CLINICAL IMPRESSION: Pt reports positive response to her aquatic therapy session. Overall she feels knees and shoulder are improving. Her shoulder AROM has improved however she has pain  with eccentric lowering left. Began sidlying shoulder abduction which she only reported  discomfort. Continued with gluteal strengthening. Unable to tolerate  side lying due to hip and shoulder pain. Worked on standing hip abduction strengthening, although she reported feeling left hip would give way with added resistance band so discontinued band. Her HEP was updated. Trial of ice pack on shoulder due to slight increase in pain after progression to stronger resistance bands. No ionto patch needed today. Will assess tolerance to this session next visit.  Patient continues to benefit from skilled PT services on land and aquatic based and should be progressed as able to improve functional independence.   Re-eval: Completed assessment for bilat legs. Knee pain appears OA related while the L hip seems most positive for tendinopathy. Therex were completed for hip mobility and for LE strengthening. A HEP was developed with a written program provided to the pt. Pt tolerated PT today without adverse effects. Pt will continue to benefit from skilled PT to address impairments for improved function with minimized pain. Will assess 5xSTS and the next PT session.  EVAL: Patient is a 53 y.o. 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   OBJECTIVE IMPAIRMENTS: decreased activity tolerance, decreased balance, difficulty walking, decreased strength, postural dysfunction, obesity, and pain.   ACTIVITY LIMITATIONS: carrying, lifting, bending, sitting, standing, squatting, sleeping, stairs, transfers, bathing, toileting, dressing, reach over head, locomotion level, and caring for others  PARTICIPATION LIMITATIONS: meal prep, cleaning, laundry, driving, shopping,  community activity, and occupation  PERSONAL FACTORS: Fitness, Past/current experiences, Time since onset of injury/illness/exacerbation, and 3+ comorbidities:    Obesity, fibromyalgia, headache syndrome, DDD, neck pain are also affecting patient's functional outcome.   REHAB POTENTIAL: Good  CLINICAL DECISION MAKING: Evolving/moderate complexity  EVALUATION COMPLEXITY: Moderate   GOALS:  SHORT TERM GOALS: Target date: 05/12/23  Pt will be Ind in an initial HEP  Baseline:started Goal status: MET  2.  Pt will voice understanding of measures to assist in pain reduction  Baseline:  Goal status: MET   LONG TERM GOALS: Target date: 07/01/23  Pt will be Ind in a final HEP to maintain achieved LOF  Baseline: started Goal status: INITIAL  2.  Pt will report 50% or greater improvement for her L shoulder for improved function and QOL. Baseline: 7-9/10 Goal status: INITIAL  3.  Pt will demonstrate the ability to lift 3# OH with good quality of movement for functional use with ADLs  and home activites Baseline: Unable Goal status: INITIAL  4.  Pt's Quick Dash score will improve by the MCID to 73% as indication of improved L shoulder function  Baseline: 89% Goal status: INITIAL  5. Improve 5xSTS by MCID of 5" and by MCID of 71ft as indication of improved functional mobility   Baseline: TBA  Goal Status: Initial  PLAN:  PT FREQUENCY: 2x/week  PT DURATION: 8 weeks  PLANNED INTERVENTIONS: 97164- PT Re-evaluation, 97110-Therapeutic exercises, 97530- Therapeutic activity, [id number]- Neuromuscular re-education, 97535- Self Care, 16109- Manual therapy, [id number]- Gait training, [phone]- Electrical stimulation (unattended), [phone]- Ionotophoresis 4mg /ml Dexamethasone, Balance training, Stair training, Taping, Dry Needling, Joint mobilization, Spinal mobilization, Cryotherapy, and Moist heat  PLAN FOR NEXT SESSION: Assess response to HEP; progress therex as indicated; use of modalities,  manual therapy; and TPDN as indicated.   Royden Purl PTA 05/23/23 1:07 PM Phone: [phone] Fax: [phone]

## 2023-05-24 NOTE — Therapy (Signed)
 OUTPATIENT PHYSICAL THERAPY SHOULDER TREATMENT/Lower Extremity TREATMENT    Patient Name: Brittney [name] MRN: [id number] DOB:August 20, 1970, 53 y.o., female Today's Date: 05/25/2023  END OF SESSION:  PT End of Session - 05/25/23 1141     Visit Number 9    Number of Visits 17    Date for PT Re-Evaluation 07/01/23    Authorization Type [employer]; MEDICAID OF [place]- no auth reqd    PT Start Time 0932    PT Stop Time 1015    PT Time Calculation (min) 43 min    Activity Tolerance Patient tolerated treatment well    Behavior During Therapy WFL for tasks assessed/performed                 Past Medical History:  Diagnosis Date   Abscess    to eyelid   Anxiety    Arthritis    KNEES ELBOWS HIPS SHOULDER FINGERS   Asthma    Back pain    Bipolar disorder (HCC)    Depression    BIPOLAR   Dyspnea    Fibromyalgia    GERD (gastroesophageal reflux disease)    Headache syndrome 12/12/2019   Headache(784.0)    MIGRAINES   History of colonoscopy    History of degenerative disc disease    Hyperlipidemia    Major depressive disorder    Memory difficulties 06/21/2014   Neck pain    OA (osteoarthritis) of knee    Obesity    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   Rheumatoid arthritis (HCC)    Seizures (HCC)    FACIAL SEIZURES LAST 4 DAYS AGO   Past Surgical History:  Procedure Laterality Date   CARDIAC CATHETERIZATION  2010   DILATION AND CURETTAGE OF UTERUS  1994   LAPAROSCOPY N/A 11/21/2012   Procedure: LAPAROSCOPY OPERATIVE REMOVAL OF RIGHT TUBE AND OVARY AND FLUID FROM MASS;  Surgeon: Bing Plume, MD;  Location: [facility];  Service: Gynecology;  Laterality: N/A;  1 1/2hrs OR time   OOPHORECTOMY     Patient Active Problem List   Diagnosis Date Noted   Weakness of both legs 08/31/2021   Myalgia, multiple sites 08/31/2021   Back pain with radiculopathy 01/07/2021   Hyperlipidemia    OA (osteoarthritis) of knee    GERD (gastroesophageal reflux disease)     Rheumatoid arthritis (HCC)    Headache syndrome 12/12/2019   Bipolar 1 disorder, depressed, moderate (HCC) 12/14/2018   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   Fibromyalgia 02/03/2015   Tendinitis of both rotator cuffs 02/03/2015   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   Insomnia secondary to chronic pain 11/13/2014   Fatigue due to sleep pattern disturbance 11/13/2014   Memory difficulties 06/21/2014   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   PTSD (post-traumatic stress disorder) 12/13/2013   Bipolar depression (HCC) 12/12/2013   PERICARDIAL EFFUSION 12/23/2008   PSEUDOTUMOR CEREBRI 12/05/2008   UNSPECIFIED PLEURAL EFFUSION 12/05/2008   DYSPNEA 12/05/2008   OBESITY 11/19/2008   Migraine 11/19/2008   ASTHMA 11/19/2008   OTHER SPECIFIED DISORDER OF STOMACH AND DUODENUM 03/11/2008    PCP: Caesar Bookman, NP   REFERRING PROVIDER: Ngetich, Donalee Citrin, NP   REFERRING DIAG:  [phone] (ICD-10-CM) - Chronic left shoulder pain  M17.0 (ICD-10-CM) - Primary osteoarthritis of both knees  M05.751,M05.752 (ICD-10-CM) - 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 She notes her L hip is bothering her the most and she has some knee pain.  Pt requested her L shoulder pain be addressed first. Pt reports her L shoulder has been hurting since January when she started  exercising more often with both aquatics and a trainer. Currently, she is not working with the trainer and she finds water exercises help to decrease the L shoulder pain. In 2019, she states she had a labral tear. Pt notes she is to receive a cortisone injection for the L shoulder on Friday. Pt received cortisone injections of both knees yesterday and they feel much better. Overall, she reports her physical current issues are debilitating.  Hand dominance: Right  PERTINENT HISTORY: Obesity, fibromyalgia, headache syndrome, DDD, neck pain  PAIN:  Are you having pain? Yes: NPRS scale: 0/10. Pain range the week prior to start of PT:7-9/10 Pain location: Anterior L shoulder Pain description: burning, sharp Aggravating factors: Use of it Relieving factors: Water therapy  Knees: 3/10  L hip: 7/10  PRECAUTIONS: None  RED FLAGS: None   WEIGHT BEARING RESTRICTIONS: No  FALLS:  Has patient fallen in last 6 months? Yes. Number of falls 1 Sharp pain with feet and dropped  LIVING ENVIRONMENT: Lives with: lives with their family Lives in: House/apartment Able to access home  OCCUPATION: Geophysicist/field seismologist  PLOF: Independent  PATIENT GOALS:Pain relief, improved function  NEXT MD VISIT:   OBJECTIVE:  Note: Objective measures were completed at Evaluation unless otherwise noted.  DIAGNOSTIC FINDINGS:  Xray R shoulder 07/16/22 IMPRESSION: Mild to moderate glenohumeral joint degeneration. No acute osseous abnormality identified about the left shoulder.  Xray R knee 04/26/23 X-rays demonstrate moderate medial and patellofemoral degenerative changes  Xray L knee 04/26/23 X-rays demonstrate moderate medial and patellofemoral degenerative changes   PATIENT SURVEYS:  Quick Dash 47/55=89% disability  COGNITION: Overall cognitive status: Within functional limits for tasks assessed     SENSATION: WFL  POSTURE: Forward head and rounded shoulder  UPPER EXTREMITY ROM:   All L  shoulder AROMs were painful with appropriate ROM. Pt completes at a slow pace Active ROM Right eval Left eval Left 05/02/23 LT 05/11/23 Lt 05/23/23  Shoulder flexion  130 140 165 165  Shoulder extension       Shoulder abduction  130 160  165 painful arc  Shoulder adduction       Shoulder internal rotation  T9     Shoulder external rotation  T4     Elbow flexion       Elbow extension       Wrist flexion       Wrist extension       Wrist ulnar deviation       Wrist radial deviation       Wrist pronation       Wrist supination       (Blank rows = not tested)  UPPER EXTREMITY MMT:  L shoulder strength was 4 to 4+/5 in a neutral position without significant increase in pain MMT Right eval Left eval  Shoulder flexion    Shoulder extension    Shoulder abduction    Shoulder adduction    Shoulder internal rotation    Shoulder external rotation    Middle trapezius    Lower trapezius    Elbow flexion    Elbow extension    Wrist flexion    Wrist extension    Wrist ulnar deviation    Wrist radial deviation    Wrist pronation    Wrist supination    Grip strength (lbs)    (Blank rows = not tested)  SHOULDER SPECIAL TESTS: Impingement tests: Hawkins/Kennedy impingement test: negative SLAP lesions: Crank test: negative and Biceps load test: negative Rotator cuff assessment: Empty can test: negative and Full can test: negative Biceps assessment: Speed's test: negative  JOINT MOBILITY TESTING:  Stable GH jt  PALPATION:  TTP to the L upper trap  TTP to the L lateral greater trochanter LE Assessement LE ROM:  WNLS Active ROM Right Eval Left Eval  Hip flexion    Hip extension    Hip abduction    Hip adduction    Hip internal rotation    Hip external rotation    Knee flexion    Knee extension    Ankle dorsiflexion    Ankle plantarflexion    Ankle inversion    Ankle eversion     (Blank rows = not tested)  LE MMT:  MMT Right Eval Left Eval  Hip flexion 4+ 4   Hip extension 4 3  Hip abduction 4+ 4  Hip adduction    Hip internal rotation    Hip external rotation 4+ 4  Knee flexion 5 5  Knee extension 5 5  Ankle dorsiflexion    Ankle plantarflexion    Ankle inversion    Ankle eversion     (Blank rows = not tested)  LOWER EXTREMITY SPECIAL TESTS:  Hip special tests: Luisa Hart (FABER) test: positive   FUNCTIONAL TESTS:  5 times sit to stand: TBA 2 minute walk test: TBA 05/09/23:  5 x STS with UE support : 15.3 sec  05/09/23: 474 feet   GAIT: Distance walked: 200' Assistive device utilized: None Level of assistance: Complete Independence Comments: WNLs  TREATMENT DATE:  [facility] Adult PT Treatment:                                                DATE: 05/25/23 Therapeutic Exercise: Piriformis and figure 4 stretches x2 30" each SL clams in supine 2x10 3" GTB Banded Bridge x15 3"  Side lying Hip abduction 2x10 each  SLR c AB engage x15, pilates ring STS x 10  LAQ GTB 10 x 2 each +HEP Self Care: Use of tennis ball for gluteal massage in sitting and standing   [facility] Adult PT Treatment:                                                DATE: 05/23/23 Therapeutic Exercise: Side shoulder abduction 10 x 2 +HEP Supine shoulder horizontal abduction GTB 10 x 2  Bridge (SL) 10 x 2 each +HEP Side lying Hip abduction x 10 each  STS x 10 -R knee pain  LAQ GTB 10 x 2 each +HEP Standing hip abduction AROM, then added red band - increased pain left hip Shoulder Row black  Shoulder ext Green  Shoulder ER/IR GTB x 10 each Updated HEP Modalities: Ice pack Left shoulder x 10 minutes   TREATMENT 05/19/23: Aquatic therapy at [facility]- [address] - therapeutic pool temp 92 degrees Pt enters building independently.  Treatment took place in water 3.8 to  4 ft 8 in.feet deep depending upon activity.  Pt entered and exited the pool  via stair and handrails    Aquatic Therapy:  Water walking for warm up fwd/lat/bkwds  Squats Hip abd Heel raise Walking march Hip flexion ext - knee straight Steps ups fwd and lat Lateral walking with squats and shoulder adduction with rainbow DB Yellow noodle stomp - 2x10  Pt requires the buoyancy of water for active assisted exercises with buoyancy supported for strengthening and AROM exercises. Hydrostatic pressure also supports joints by unweighting joint load by at least 50 % in 3-4 feet depth water. 80% in chest to neck deep water. Water will provide assistance with movement using the current and laminar flow while the buoyancy reduces weight bearing. Pt requires the viscosity of the water for resistance with strengthening exercises.  PATIENT EDUCATION: Education details: Eval findings, POC, HEP Person educated: Patient Education method: Explanation, Demonstration, Tactile cues, and Verbal cues Education comprehension: verbalized understanding, returned demonstration, verbal cues required, and tactile cues required  HOME EXERCISE PROGRAM: Access Code: [email or web address] URL: [email or web address] Date: 05/04/2023 Prepared by: Joellyn Rued  Exercises - Flexion-Extension Shoulder Pendulum with Table Support  - 3 x daily - 7 x weekly - 1 sets - 20 reps - Seated Piriformis Stretch  - 1 x daily - 7 x weekly - 1 sets - 3 reps - 30 hold - Seated Piriformis Stretch with Trunk Bend  - 1 x daily - 7 x weekly - 1 sets - 3 reps - 30 hold - Supine Piriformis Stretch with Foot on Ground  - 1 x daily - 7 x weekly - 1 sets - 3 reps - 30 hold - Supine Figure 4 Piriformis Stretch  - 1 x daily - 7 x weekly - 1 sets - 3 reps - 30 hold - Active Straight Leg  Raise with Quad Set  - 1 x daily - 7 x weekly - 2 sets - 10 reps - 3 hold - Supine Bridge with Resistance Band  - 1 x daily - 7 x weekly - 2 sets - 10 reps - 2 hold - Hooklying Clamshell with Resistance  - 1 x daily - 7 x weekly - 2 sets  - 10 reps - 2 hold - Supine March with Resistance Band  - 1 x daily - 7 x weekly - 2 sets - 10 reps - 2 hold Added 3/24 - Standing Bilateral Low Shoulder Row with Anchored Resistance  - 1 x daily - 7 x weekly - 2 sets - 10 reps - Shoulder extension with resistance - Neutral  - 1 x daily - 7 x weekly - 2 sets - 10 reps - Shoulder Internal Rotation with Resistance  - 1 x daily - 7 x weekly - 2 sets - 10 reps - Shoulder External Rotation with Anchored Resistance  - 1 x daily - 7 x weekly - 2 sets - 10 reps ASSESSMENT:  CLINICAL IMPRESSION: Pt presents to PT with her L hip giving her the most difficulty re: pain. FABER was positive, AROM was good, and pt was TTP to the L greater trochanter and inguinal areas. PT was completed for hip and knee strengthening. Pt tolerated PT today without adverse effects. Pt was instructed use of tennis ball massage to the L gluteal muscles to help with minimizing L hip pain. Discussed to use of TPDN or iontophoresis as measures to address L hip pain. Patient will continue to benefit from skilled PT services with land and aquatic based therapy, progressing as able to improve functional independence.   Re-eval: Completed assessment for bilat legs. Knee pain appears OA related while the L hip seems most positive for tendinopathy. Therex were completed for hip mobility and for LE strengthening. A HEP was developed with a written program provided to the pt. Pt tolerated PT today without adverse effects. Pt will continue to benefit from skilled PT to address impairments for improved function with minimized pain. Will assess 5xSTS and the next PT session.  EVAL: Patient is a 53 y.o. 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   OBJECTIVE IMPAIRMENTS: decreased activity tolerance, decreased balance, difficulty walking, decreased strength, postural dysfunction, obesity, and pain.   ACTIVITY LIMITATIONS: carrying, lifting, bending, sitting, standing, squatting, sleeping, stairs, transfers, bathing, toileting, dressing, reach over head, locomotion level, and caring for others  PARTICIPATION LIMITATIONS: meal prep, cleaning, laundry, driving, shopping, community activity, and occupation  PERSONAL FACTORS: Fitness, Past/current experiences, Time since onset of injury/illness/exacerbation, and 3+ comorbidities:    Obesity, fibromyalgia, headache syndrome, DDD, neck pain are also affecting patient's functional outcome.   REHAB POTENTIAL: Good  CLINICAL DECISION MAKING: Evolving/moderate complexity  EVALUATION COMPLEXITY: Moderate   GOALS:  SHORT TERM GOALS: Target date: 05/12/23  Pt will be Ind in an initial HEP  Baseline:started Goal status: MET  2.  Pt will voice understanding of measures to assist in pain reduction  Baseline:  Goal status: MET   LONG TERM GOALS: Target date: 07/01/23  Pt will be Ind in a final HEP to maintain achieved LOF  Baseline: started Goal status: INITIAL  2.  Pt will  report 50% or greater improvement for her L shoulder for improved function and QOL. Baseline: 7-9/10 Goal status: INITIAL  3.  Pt will demonstrate the ability to lift 3# OH with good quality of movement for functional use with ADLs  and home activites Baseline: Unable Goal status: INITIAL  4.  Pt's Quick Dash score will improve by the MCID to 73% as indication of improved L shoulder function  Baseline: 89% Goal status: INITIAL  5. Improve 5xSTS by MCID of 5" and by MCID of 61ft as indication of improved functional mobility   Baseline: TBA  Goal Status: Initial  PLAN:  PT FREQUENCY: 2x/week  PT DURATION: 8 weeks  PLANNED INTERVENTIONS: 97164- PT Re-evaluation, 97110-Therapeutic  exercises, 97530- Therapeutic activity, [id number]- Neuromuscular re-education, 97535- Self Care, [id number]- Manual therapy, [id number]- Gait training, [phone]- Electrical stimulation (unattended), [phone]- Ionotophoresis 4mg /ml Dexamethasone, Balance training, Stair training, Taping, Dry Needling, Joint mobilization, Spinal mobilization, Cryotherapy, and Moist heat  PLAN FOR NEXT SESSION: Assess response to HEP; progress therex as indicated; use of modalities, manual therapy; and TPDN as indicated.   Cohan Stipes MS, PT 05/25/23 1:25 PM

## 2023-05-25 ENCOUNTER — Ambulatory Visit

## 2023-05-25 ENCOUNTER — Encounter

## 2023-05-25 DIAGNOSIS — M6281 Muscle weakness (generalized): Secondary | ICD-10-CM

## 2023-05-25 DIAGNOSIS — M25552 Pain in left hip: Secondary | ICD-10-CM

## 2023-05-25 DIAGNOSIS — R262 Difficulty in walking, not elsewhere classified: Secondary | ICD-10-CM

## 2023-05-25 DIAGNOSIS — M25512 Pain in left shoulder: Secondary | ICD-10-CM

## 2023-05-25 DIAGNOSIS — G8929 Other chronic pain: Secondary | ICD-10-CM

## 2023-05-26 ENCOUNTER — Ambulatory Visit: Admitting: Physical Therapy

## 2023-05-30 NOTE — Therapy (Signed)
 OUTPATIENT PHYSICAL THERAPY SHOULDER TREATMENT/Lower Extremity TREATMENT    Patient Name: Brittney [name] MRN: [id number] DOB:[birth year], 53 y.o., female Today's Date: 05/31/2023  END OF SESSION:  PT End of Session - 05/31/23 0935     Visit Number 9    Number of Visits 17    Date for PT Re-Evaluation 07/01/23    Authorization Type [employer]; MEDICAID OF [place]- no auth reqd    PT Start Time 0931    PT Stop Time 1016    PT Time Calculation (min) 45 min    Activity Tolerance Patient tolerated treatment well    Behavior During Therapy WFL for tasks assessed/performed                  Past Medical History:  Diagnosis Date   Abscess    to eyelid   Anxiety    Arthritis    KNEES ELBOWS HIPS SHOULDER FINGERS   Asthma    Back pain    Bipolar disorder (HCC)    Depression    BIPOLAR   Dyspnea    Fibromyalgia    GERD (gastroesophageal reflux disease)    Headache syndrome 12/12/2019   Headache(784.0)    MIGRAINES   History of colonoscopy    History of degenerative disc disease    Hyperlipidemia    Major depressive disorder    Memory difficulties 06/21/2014   Neck pain    OA (osteoarthritis) of knee    Obesity    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   Rheumatoid arthritis (HCC)    Seizures (HCC)    FACIAL SEIZURES LAST 4 DAYS AGO   Past Surgical History:  Procedure Laterality Date   CARDIAC CATHETERIZATION  2010   DILATION AND CURETTAGE OF UTERUS  1994   LAPAROSCOPY N/A 11/21/2012   Procedure: LAPAROSCOPY OPERATIVE REMOVAL OF RIGHT TUBE AND OVARY AND FLUID FROM MASS;  Surgeon: Bing Plume, MD;  Location: [facility];  Service: Gynecology;  Laterality: N/A;  1 1/2hrs OR time   OOPHORECTOMY     Patient Active Problem List   Diagnosis Date Noted   Weakness of both legs 08/31/2021   Myalgia, multiple sites 08/31/2021   Back pain with radiculopathy 01/07/2021   Hyperlipidemia    OA (osteoarthritis) of knee    GERD (gastroesophageal reflux disease)     Rheumatoid arthritis (HCC)    Headache syndrome 12/12/2019   Bipolar 1 disorder, depressed, moderate (HCC) 12/14/2018   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   Fibromyalgia 02/03/2015   Tendinitis of both rotator cuffs 02/03/2015   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   Insomnia secondary to chronic pain 11/13/2014   Fatigue due to sleep pattern disturbance 11/13/2014   Memory difficulties 06/21/2014   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   PTSD (post-traumatic stress disorder) 12/13/2013   Bipolar depression (HCC) 12/12/2013   PERICARDIAL EFFUSION 12/23/2008   PSEUDOTUMOR CEREBRI 12/05/2008   UNSPECIFIED PLEURAL EFFUSION 12/05/2008   DYSPNEA 12/05/2008   OBESITY 11/19/2008   Migraine 11/19/2008   ASTHMA 11/19/2008   OTHER SPECIFIED DISORDER OF STOMACH AND DUODENUM 03/11/2008    PCP: Caesar Bookman, NP   REFERRING PROVIDER: Ngetich, Donalee Citrin, NP   REFERRING DIAG:  [phone] (ICD-10-CM) - Chronic left shoulder pain  M17.0 (ICD-10-CM) - Primary osteoarthritis of both knees  M05.751,M05.752 (ICD-10-CM) - 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  Pt requested her L shoulder pain be addressed first. Pt reports her L shoulder has been hurting since January when she started exercising more often with both aquatics and a trainer. Currently, she is  not working with the trainer and she finds water exercises help to decrease the L shoulder pain. In 2019, she states she had a labral tear. Pt notes she is to receive a cortisone injection for the L shoulder on Friday. Pt received cortisone injections of both knees yesterday and they feel much better. Overall, she reports her physical current issues are debilitating.  Hand dominance: Right  PERTINENT HISTORY: Obesity, fibromyalgia, headache syndrome, DDD, neck pain  PAIN:  Are you having pain? Yes: NPRS scale: 0/10. Pain range the week prior to start of PT:7-9/10 Pain location: Anterior L shoulder Pain description: burning, sharp Aggravating factors: Use of it Relieving factors: Water therapy  Knees: 0/10  L hip: 3/10  PRECAUTIONS: None  RED FLAGS: None   WEIGHT BEARING RESTRICTIONS: No  FALLS:  Has patient fallen in last 6 months? Yes. Number of falls 1 Sharp pain with feet and dropped  LIVING ENVIRONMENT: Lives with: lives with their family Lives in: House/apartment Able to access home  OCCUPATION: Geophysicist/field seismologist  PLOF: Independent  PATIENT GOALS:Pain relief, improved function  NEXT MD VISIT:   OBJECTIVE:  Note: Objective measures were completed at Evaluation unless otherwise noted.  DIAGNOSTIC FINDINGS:  Xray R shoulder 07/16/22 IMPRESSION: Mild to moderate glenohumeral joint degeneration. No acute osseous abnormality identified about the left shoulder.  Xray R knee 04/26/23 X-rays demonstrate moderate medial and patellofemoral degenerative changes  Xray L knee 04/26/23 X-rays demonstrate moderate medial and patellofemoral degenerative changes   PATIENT SURVEYS:  Quick Dash 47/55=89% disability  COGNITION: Overall cognitive status: Within functional limits for tasks assessed     SENSATION: WFL  POSTURE: Forward head and rounded shoulder  UPPER EXTREMITY ROM:   All L shoulder AROMs were painful with appropriate ROM. Pt completes at a slow  pace Active ROM Right eval Left eval Left 05/02/23 LT 05/11/23 Lt 05/23/23  Shoulder flexion  130 140 165 165  Shoulder extension       Shoulder abduction  130 160  165 painful arc  Shoulder adduction       Shoulder internal rotation  T9     Shoulder external rotation  T4     Elbow flexion       Elbow extension       Wrist flexion       Wrist extension       Wrist ulnar deviation       Wrist radial deviation       Wrist pronation       Wrist supination       (Blank rows = not tested)  UPPER EXTREMITY MMT:  L shoulder strength was 4 to 4+/5 in a neutral position without significant increase in pain MMT Right eval Left eval  Shoulder flexion    Shoulder extension    Shoulder abduction    Shoulder adduction    Shoulder internal rotation    Shoulder external rotation    Middle trapezius    Lower trapezius    Elbow flexion    Elbow extension    Wrist flexion    Wrist extension    Wrist ulnar deviation    Wrist radial deviation    Wrist pronation    Wrist supination    Grip strength (lbs)    (Blank rows = not tested)  SHOULDER SPECIAL TESTS: Impingement tests: Hawkins/Kennedy impingement test: negative SLAP lesions: Crank test: negative and Biceps load test: negative Rotator cuff assessment: Empty can test: negative and Full can test: negative Biceps assessment: Speed's test: negative  JOINT MOBILITY TESTING:  Stable GH jt  PALPATION:  TTP to the L upper trap  TTP to the L lateral greater trochanter LE Assessement LE ROM:  WNLS Active ROM Right Eval Left Eval  Hip flexion    Hip extension    Hip abduction    Hip adduction    Hip internal rotation    Hip external rotation    Knee flexion    Knee extension    Ankle dorsiflexion    Ankle plantarflexion    Ankle inversion    Ankle eversion     (Blank rows = not tested)  LE MMT:  MMT Right Eval Left Eval  Hip flexion 4+ 4  Hip extension 4 3  Hip abduction 4+ 4  Hip adduction    Hip internal  rotation    Hip external rotation 4+ 4  Knee flexion 5 5  Knee extension 5 5  Ankle dorsiflexion    Ankle plantarflexion    Ankle inversion    Ankle eversion     (Blank rows = not tested)  LOWER EXTREMITY SPECIAL TESTS:  Hip special tests: Portia Brittle (FABER) test: positive   FUNCTIONAL TESTS:  5 times sit to stand: TBA 2 minute walk test: TBA 05/09/23:  5 x STS with UE support : 15.3 sec  05/09/23: 474 feet   GAIT: Distance walked: 200' Assistive device utilized: None Level of assistance: Complete Independence Comments: WNLs  TREATMENT DATE:  [facility] Adult PT Treatment:                                                DATE: 05/31/2023 Manual Therapy: STM to the piriformis and gluteal muscles with TPR Therapeutic Exercise: Piriformis and figure 4 stretches x2 30" each Banded Bridge x15 3"  SLR c AB engage x15, pilates ring Therapeutic Activity: Nustep L5 6 mins UE/LE Banded side steps BluTB Banded wall squats 2x8 BluTB Modalities: Iontophoresis 4 mg/ml 6 hours, dexamethasone Advised re: skin irritation from adhesive and not to get the patch wet  [facility] Adult PT Treatment:                                                DATE: 05/25/23 Therapeutic Exercise: Piriformis and figure 4 stretches x2 30" each SL clams in supine 2x10 3" GTB Banded Bridge x15 3"  Side lying Hip abduction 2x10 each  SLR c AB engage x15, pilates ring STS x 10  LAQ GTB 10 x 2 each +HEP Self Care: Use of tennis ball for gluteal massage in sitting and standing   [facility] Adult PT Treatment:                                                DATE: 05/23/23 Therapeutic Exercise: Side shoulder abduction 10 x 2 +HEP Supine shoulder horizontal abduction GTB 10 x 2  Bridge (SL) 10 x 2 each +HEP Side lying Hip abduction x 10 each  STS x 10 -R knee pain  LAQ GTB 10 x 2 each +HEP Standing hip abduction  AROM, then added red band - increased pain left hip Shoulder Row black  Shoulder ext Green  Shoulder ER/IR GTB x 10 each Updated HEP Modalities: Ice pack Left shoulder x 10 minutes   TREATMENT 05/19/23: Aquatic therapy at [facility]- [address] - therapeutic pool temp 92 degrees Pt enters building independently.  Treatment took place in water 3.8 to  4 ft 8 in.feet deep depending upon activity.  Pt entered and exited the pool via stair and handrails    Aquatic Therapy:  Water walking for warm up fwd/lat/bkwds  Squats Hip abd Heel raise Walking march Hip flexion ext - knee straight Steps ups fwd and lat Lateral walking with squats and shoulder adduction with rainbow DB Yellow noodle stomp - 2x10  Pt requires the buoyancy of water for active assisted exercises with buoyancy supported for strengthening and AROM exercises. Hydrostatic pressure also supports joints by unweighting joint load by at least 50 % in 3-4 feet depth water. 80% in chest to neck deep water. Water will provide assistance with movement using the current and laminar flow while the buoyancy reduces weight bearing. Pt requires the viscosity of the water for resistance with strengthening exercises.  PATIENT EDUCATION: Education details: Eval findings, POC, HEP Person educated: Patient Education method: Explanation, Demonstration, Tactile cues, and Verbal cues Education comprehension: verbalized understanding, returned demonstration, verbal cues required, and tactile cues required  HOME EXERCISE PROGRAM: Access Code: [email or web address] URL: [email or web address] Date: 05/04/2023 Prepared by: Joellyn Rued  Exercises -  Flexion-Extension Shoulder Pendulum with Table Support  - 3 x daily - 7 x weekly - 1 sets - 20 reps - Seated Piriformis Stretch  - 1 x daily - 7 x weekly - 1 sets - 3 reps - 30 hold - Seated Piriformis Stretch with Trunk Bend  - 1 x daily - 7 x weekly - 1 sets - 3 reps - 30 hold - Supine  Piriformis Stretch with Foot on Ground  - 1 x daily - 7 x weekly - 1 sets - 3 reps - 30 hold - Supine Figure 4 Piriformis Stretch  - 1 x daily - 7 x weekly - 1 sets - 3 reps - 30 hold - Active Straight Leg Raise with Quad Set  - 1 x daily - 7 x weekly - 2 sets - 10 reps - 3 hold - Supine Bridge with Resistance Band  - 1 x daily - 7 x weekly - 2 sets - 10 reps - 2 hold - Hooklying Clamshell with Resistance  - 1 x daily - 7 x weekly - 2 sets - 10 reps - 2 hold - Supine March with Resistance Band  - 1 x daily - 7 x weekly - 2 sets - 10 reps - 2 hold Added 3/24 - Standing Bilateral Low Shoulder Row with Anchored Resistance  - 1 x daily - 7 x weekly - 2 sets - 10 reps - Shoulder extension with resistance - Neutral  - 1 x daily - 7 x weekly - 2 sets - 10 reps - Shoulder Internal Rotation with Resistance  - 1 x daily - 7 x weekly - 2 sets - 10 reps - Shoulder External Rotation with Anchored Resistance  - 1 x daily - 7 x weekly - 2 sets - 10 reps ASSESSMENT:  CLINICAL IMPRESSION: Pt presents to PT with all areas of pain doing better. Her L hip continues to bother her some, but it is better. Completed STM c TPR to the L piriformis and gluteal muscles. Afterward exercises for lower body strengthening and flexibility were completed. Pt is TTP of the sup/lateral greater trochanter area. A iontophoresis patch was applied to address this painful area. Overall, pt is making progress regarding her multiple areas of pain. Will consider TPDN for the piriformis and gluteal muscles the next PT session.  Re-eval: Completed assessment for bilat legs. Knee pain appears OA related while the L hip seems most positive for tendinopathy. Therex were completed for hip mobility and for LE strengthening. A HEP was developed with a written program provided to the pt. Pt tolerated PT today without adverse effects. Pt will continue to benefit from skilled PT to address impairments for improved function with minimized pain. Will  assess 5xSTS and the next PT session.  EVAL: Patient is a 53 y.o. 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   OBJECTIVE IMPAIRMENTS: decreased activity tolerance, decreased balance, difficulty walking, decreased strength, postural dysfunction, obesity, and pain.   ACTIVITY LIMITATIONS: carrying, lifting, bending, sitting, standing, squatting, sleeping, stairs, transfers, bathing, toileting, dressing, reach over head, locomotion level, and caring for others  PARTICIPATION LIMITATIONS: meal prep, cleaning, laundry, driving, shopping, community activity, and occupation  PERSONAL FACTORS: Fitness, Past/current experiences, Time since onset of injury/illness/exacerbation, and 3+ comorbidities:  Obesity, fibromyalgia, headache syndrome, DDD, neck pain are also affecting patient's functional outcome.   REHAB POTENTIAL: Good  CLINICAL DECISION MAKING: Evolving/moderate complexity  EVALUATION COMPLEXITY: Moderate   GOALS:  SHORT TERM GOALS: Target date: 05/12/23  Pt will be Ind in an initial HEP  Baseline:started Goal status: ONGOING  2.  Pt will voice understanding of measures to assist in pain reduction  Baseline:  Goal status: MET   LONG TERM GOALS: Target date: 07/01/23  Pt will be Ind in a final HEP to maintain achieved LOF  Baseline: started Goal status: INITIAL  2.  Pt will report 50% or greater improvement for her L shoulder for improved function and QOL. Baseline: 7-9/10 Goal status: INITIAL  3.  Pt will demonstrate the ability to lift 3# OH with good quality of movement for functional use with ADLs  and home activites Baseline: Unable Goal status: INITIAL  4.  Pt's Quick Dash score will improve by the  MCID to 73% as indication of improved L shoulder function  Baseline: 89% Goal status: INITIAL  5. Improve 5xSTS by MCID of 5" and by MCID of 64ft as indication of improved functional mobility   Baseline: TBA  Goal Status: Initial  PLAN:  PT FREQUENCY: 2x/week  PT DURATION: 8 weeks  PLANNED INTERVENTIONS: 97164- PT Re-evaluation, 97110-Therapeutic exercises, 97530- Therapeutic activity, [id number]- Neuromuscular re-education, 97535- Self Care, 98119- Manual therapy, [id number]- Gait training, [phone]- Electrical stimulation (unattended), [phone]- Ionotophoresis 4mg /ml Dexamethasone, Balance training, Stair training, Taping, Dry Needling, Joint mobilization, Spinal mobilization, Cryotherapy, and Moist heat  PLAN FOR NEXT SESSION: Assess response to HEP; progress therex as indicated; use of modalities, manual therapy; and TPDN as indicated.   Natelie Ostrosky MS, PT 05/31/23 5:37 PM

## 2023-05-31 ENCOUNTER — Ambulatory Visit

## 2023-05-31 ENCOUNTER — Telehealth: Payer: Self-pay

## 2023-05-31 DIAGNOSIS — M25512 Pain in left shoulder: Secondary | ICD-10-CM | POA: Diagnosis not present

## 2023-05-31 DIAGNOSIS — G8929 Other chronic pain: Secondary | ICD-10-CM

## 2023-05-31 DIAGNOSIS — M6281 Muscle weakness (generalized): Secondary | ICD-10-CM

## 2023-05-31 DIAGNOSIS — R262 Difficulty in walking, not elsewhere classified: Secondary | ICD-10-CM

## 2023-05-31 DIAGNOSIS — M25552 Pain in left hip: Secondary | ICD-10-CM

## 2023-05-31 NOTE — Telephone Encounter (Signed)
Message routed to PCP Ngetich, Dinah C, NP  

## 2023-05-31 NOTE — Telephone Encounter (Signed)
 Copied from CRM [id number]. Topic: Clinical - Request for Lab/Test Order >> May 31, 2023 10:25 AM Tisa Forester wrote: Reason for CRM: Patient been in physical therapy for a couple of month for knees and shoulder  patient physical therapist recommend that patient get a xray hips , to determine where the pain is coming from there already been a xray knees and shoulder but nothing for the hips    Please reach out to patient to let know status - [phone]

## 2023-05-31 NOTE — Telephone Encounter (Signed)
 Bilateral hip X-ray ordered.please notify patient to get X-rays done at [facility] at [address].

## 2023-05-31 NOTE — Therapy (Signed)
 OUTPATIENT PHYSICAL THERAPY SHOULDER TREATMENT/Lower Extremity TREATMENT    Patient Name: Brittney [name] MRN: [id number] DOB:Feb 22, 1970, 53 y.o., female Today's Date: 06/01/2023  END OF SESSION:  PT End of Session - 06/01/23 1324     Visit Number 10    Number of Visits 17    Date for PT Re-Evaluation 07/01/23    Authorization Type [employer]IC[employer]; MEDICAID OF [place]- no auth reqd    PT Start Time 1325    PT Stop Time 1411    PT Time Calculation (min) 46 min    Activity Tolerance Patient tolerated treatment well    Behavior During Therapy WFL for tasks assessed/performed                   Past Medical History:  Diagnosis Date   Abscess    to eyelid   Anxiety    Arthritis    KNEES ELBOWS HIPS SHOULDER FINGERS   Asthma    Back pain    Bipolar disorder (HCC)    Depression    BIPOLAR   Dyspnea    Fibromyalgia    GERD (gastroesophageal reflux disease)    Headache syndrome 12/12/2019   Headache(784.0)    MIGRAINES   History of colonoscopy    History of degenerative disc disease    Hyperlipidemia    Major depressive disorder    Memory difficulties 06/21/2014   Neck pain    OA (osteoarthritis) of knee    Obesity    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   Rheumatoid arthritis (HCC)    Seizures (HCC)    FACIAL SEIZURES LAST 4 DAYS AGO   Past Surgical History:  Procedure Laterality Date   CARDIAC CATHETERIZATION  2010   DILATION AND CURETTAGE OF UTERUS  1994   LAPAROSCOPY N/A 11/21/2012   Procedure: LAPAROSCOPY OPERATIVE REMOVAL OF RIGHT TUBE AND OVARY AND FLUID FROM MASS;  Surgeon: Bing Plume, MD;  Location: [facility];  Service: Gynecology;  Laterality: N/A;  1 1/2hrs OR time   OOPHORECTOMY     Patient Active Problem List   Diagnosis Date Noted   Weakness of both legs 08/31/2021   Myalgia, multiple sites 08/31/2021   Back pain with radiculopathy 01/07/2021   Hyperlipidemia    OA (osteoarthritis) of knee    GERD (gastroesophageal reflux disease)     Rheumatoid arthritis (HCC)    Headache syndrome 12/12/2019   Bipolar 1 disorder, depressed, moderate (HCC) 12/14/2018   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   Fibromyalgia 02/03/2015   Tendinitis of both rotator cuffs 02/03/2015   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   Insomnia secondary to chronic pain 11/13/2014   Fatigue due to sleep pattern disturbance 11/13/2014   Memory difficulties 06/21/2014   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   PTSD (post-traumatic stress disorder) 12/13/2013   Bipolar depression (HCC) 12/12/2013   PERICARDIAL EFFUSION 12/23/2008   PSEUDOTUMOR CEREBRI 12/05/2008   UNSPECIFIED PLEURAL EFFUSION 12/05/2008   DYSPNEA 12/05/2008   OBESITY 11/19/2008   Migraine 11/19/2008   ASTHMA 11/19/2008   OTHER SPECIFIED DISORDER OF STOMACH AND DUODENUM 03/11/2008    PCP: Caesar Bookman, NP   REFERRING PROVIDER: Ngetich, Donalee Citrin, NP   REFERRING DIAG:  [phone] (ICD-10-CM) - Chronic left shoulder pain  M17.0 (ICD-10-CM) - Primary osteoarthritis of both knees  M05.751,M05.752 (ICD-10-CM) - 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 And she reports that the ionto patch for left hip did not help her. She was interested in dry needling   EVal -Pt requested her L shoulder pain  be addressed first. Pt reports her L shoulder has been hurting since January when she started exercising more often with both aquatics and a trainer. Currently, she is not working with the trainer and she finds water exercises help to decrease the L shoulder pain. In 2019, she states she had a labral tear. Pt notes she is to receive a cortisone injection for the L shoulder on Friday. Pt received cortisone injections of both knees yesterday and they feel much better. Overall, she reports her physical current issues are debilitating.  Hand dominance: Right  PERTINENT HISTORY: Obesity, fibromyalgia, headache syndrome, DDD, neck pain  PAIN:  Are you having pain? Yes: NPRS scale: 0/10. Pain range the week prior to start of PT:7-9/10 Pain location: Anterior L shoulder Pain description: burning, sharp Aggravating factors: Use of it Relieving factors: Water therapy  Knees: 0/10  L hip: 3/10  PRECAUTIONS: None  RED FLAGS: None   WEIGHT BEARING RESTRICTIONS: No  FALLS:  Has patient fallen in last 6 months? Yes. Number of falls 1 Sharp pain with feet and dropped  LIVING ENVIRONMENT: Lives with: lives with their family Lives in: House/apartment Able to access home  OCCUPATION: Geophysicist/field seismologist  PLOF: Independent  PATIENT GOALS:Pain relief, improved function  NEXT MD VISIT:   OBJECTIVE:  Note: Objective measures were completed at Evaluation unless otherwise noted.  DIAGNOSTIC FINDINGS:  Xray R shoulder 07/16/22 IMPRESSION: Mild to moderate glenohumeral joint degeneration. No acute osseous abnormality identified about the left shoulder.  Xray R knee 04/26/23 X-rays demonstrate moderate medial and patellofemoral degenerative changes  Xray L knee 04/26/23 X-rays demonstrate moderate medial and patellofemoral degenerative changes   PATIENT SURVEYS:  Quick Dash 47/55=89% disability  COGNITION: Overall cognitive status: Within functional limits for tasks  assessed     SENSATION: WFL  POSTURE: Forward head and rounded shoulder  UPPER EXTREMITY ROM:   All L shoulder AROMs were painful with appropriate ROM.  Pt completes at a slow pace Active ROM Right eval Left eval Left 05/02/23 LT 05/11/23 Lt 05/23/23  Shoulder flexion  130 140 165 165  Shoulder extension       Shoulder abduction  130 160  165 painful arc  Shoulder adduction       Shoulder internal rotation  T9     Shoulder external rotation  T4     Elbow flexion       Elbow extension       Wrist flexion       Wrist extension       Wrist ulnar deviation       Wrist radial deviation       Wrist pronation       Wrist supination       (Blank rows = not tested)  UPPER EXTREMITY MMT:  L shoulder strength was 4 to 4+/5 in a neutral position without significant increase in pain MMT Right eval Left eval  Shoulder flexion    Shoulder extension    Shoulder abduction    Shoulder adduction    Shoulder internal rotation    Shoulder external rotation    Middle trapezius    Lower trapezius    Elbow flexion    Elbow extension    Wrist flexion    Wrist extension    Wrist ulnar deviation    Wrist radial deviation    Wrist pronation    Wrist supination    Grip strength (lbs)    (Blank rows = not tested)  SHOULDER SPECIAL TESTS: Impingement tests: Hawkins/Kennedy impingement test: negative SLAP lesions: Crank test: negative and Biceps load test: negative Rotator cuff assessment: Empty can test: negative and Full can test: negative Biceps assessment: Speed's test: negative  JOINT MOBILITY TESTING:  Stable GH jt  PALPATION:  TTP to the L upper trap  TTP to the L lateral greater trochanter LE Assessement LE ROM:  WNLS Active ROM Right Eval Left Eval  Hip flexion    Hip extension    Hip abduction    Hip adduction    Hip internal rotation    Hip external rotation    Knee flexion    Knee extension    Ankle dorsiflexion    Ankle plantarflexion    Ankle inversion     Ankle eversion     (Blank rows = not tested)  LE MMT:  MMT Right Eval Left Eval  Hip flexion 4+ 4  Hip extension 4 3  Hip abduction 4+ 4  Hip adduction    Hip internal rotation    Hip external rotation 4+ 4  Knee flexion 5 5  Knee extension 5 5  Ankle dorsiflexion    Ankle plantarflexion    Ankle inversion    Ankle eversion     (Blank rows = not tested)  LOWER EXTREMITY SPECIAL TESTS:  Hip special tests: Portia Brittle (FABER) test: positive   FUNCTIONAL TESTS:  5 times sit to stand: TBA 2 minute walk test: TBA 05/09/23:  5 x STS with UE support : 15.3 sec  05/09/23: 474 feet   GAIT: Distance walked: 200' Assistive device utilized: None Level of assistance: Complete Independence Comments: WNLs  TREATMENT DATE: [facility] Adult PT Treatment:                                                DATE: 06-01-23 Aquatic therapy at [facility]- [address] - therapeutic pool temp 92 degrees Pt enters building independently.  Treatment took place in water 3.8 to  4 ft 8 in.feet deep depending upon activity.  Pt entered and exited the pool via stair and handrails   Initiation of RX  Left shoulder 4/10 and Left hip 8/10 Aquatic Therapy:  Water walking for warm up fwd/lat/bkwds At initiation of aquatic therapy pt received aqua stretch for pectorals of left shoulder and left hip with reduced sharpness in pain.  Squats Standing rows UE with yellow resistant bells Standing UE horizontal abduction with yellow bells Standing  UEabduction/adduction with yellow bells Hip extension Hip abd Heel raise Walking march Hip flexion ext - knee straight Steps ups fwd and lat Lateral walking with squats and shoulder adduction with rainbow DB Runners stretch bottom step x30" BIL Hamstring stretch bottom step x30" BIL Figure 8 arm circles   Pt requires the buoyancy of water  for active assisted exercises with buoyancy supported for strengthening and AROM exercises. Hydrostatic pressure also supports joints by unweighting joint load by at least 50 % in 3-4 feet depth water. 80% in chest to neck deep water. Water will provide assistance with movement using the current and laminar flow while the buoyancy reduces weight bearing. Pt requires the viscosity of the water for resistance with strengthening exercises.   [facility] Adult PT Treatment:                                                DATE: 05/31/2023 Manual Therapy: STM to the piriformis and gluteal muscles with TPR Therapeutic Exercise: Piriformis and figure 4 stretches x2 30" each Banded Bridge x15 3"  SLR c AB engage x15, pilates ring Therapeutic Activity: Nustep L5 6 mins UE/LE Banded side steps BluTB Banded wall squats 2x8 BluTB Modalities: Iontophoresis 4 mg/ml 6 hours, dexamethasone Advised re: skin irritation from adhesive and not to get the patch wet  [facility] Adult PT Treatment:                                                DATE: 05/25/23 Therapeutic Exercise: Piriformis and figure 4 stretches x2 30" each SL clams in supine 2x10 3" GTB Banded Bridge x15 3"  Side lying Hip abduction 2x10 each  SLR c AB engage x15, pilates ring STS x 10  LAQ GTB 10 x 2 each +HEP Self Care: Use of tennis ball for gluteal massage in sitting and standing   [facility] Adult PT Treatment:                                                DATE: 05/23/23 Therapeutic Exercise: Side shoulder abduction 10 x 2 +HEP Supine shoulder horizontal abduction GTB 10 x 2  Bridge (  SL) 10 x 2 each +HEP Side lying Hip abduction x 10 each  STS x 10 -R knee pain  LAQ GTB 10 x 2 each +HEP Standing hip abduction AROM, then added red band - increased pain left hip Shoulder Row black  Shoulder ext Green  Shoulder ER/IR GTB x 10 each Updated HEP Modalities: Ice pack Left shoulder x 10 minutes   PATIENT EDUCATION: Education details: Eval findings,  POC, HEP Person educated: Patient Education method: Explanation, Demonstration, Tactile cues, and Verbal cues Education comprehension: verbalized understanding, returned demonstration, verbal cues required, and tactile cues required  HOME EXERCISE PROGRAM: Access Code: [email or web address] URL: [email or web address] Date: 05/04/2023 Prepared by: Liborio Reeds  Exercises - Flexion-Extension Shoulder Pendulum with Table Support  - 3 x daily - 7 x weekly - 1 sets - 20 reps - Seated Piriformis Stretch  - 1 x daily - 7 x weekly - 1 sets - 3 reps - 30 hold - Seated Piriformis Stretch with Trunk Bend  - 1 x daily - 7 x weekly - 1 sets - 3 reps - 30 hold - Supine Piriformis Stretch with Foot on Ground  - 1 x daily - 7 x weekly - 1 sets - 3 reps - 30 hold - Supine Figure 4 Piriformis Stretch  - 1 x daily - 7 x weekly - 1 sets - 3 reps - 30 hold - Active Straight Leg Raise with Quad Set  - 1 x daily - 7 x weekly - 2 sets - 10 reps - 3 hold - Supine Bridge with Resistance Band  - 1 x daily - 7 x weekly - 2 sets - 10 reps - 2 hold - Hooklying Clamshell with Resistance  - 1 x daily - 7 x weekly - 2 sets - 10 reps - 2 hold - Supine March with Resistance Band  - 1 x daily - 7 x weekly - 2 sets - 10 reps - 2 hold Added 3/24 - Standing Bilateral Low Shoulder Row with Anchored Resistance  - 1 x daily - 7 x weekly - 2 sets - 10 reps - Shoulder extension with resistance - Neutral  - 1 x daily - 7 x weekly - 2 sets - 10 reps - Shoulder Internal Rotation with Resistance  - 1 x daily - 7 x weekly - 2 sets - 10 reps - Shoulder External Rotation with Anchored Resistance  - 1 x daily - 7 x weekly - 2 sets - 10 reps  Aquatic program Access Code: [email or web address] URL: [email or web address] Date: 06/01/2023 Prepared by: Sharlet Dawson  Exercises - Forward and Backward Walking Lunge in [employer]  - 1 x daily - 7 x weekly - 3 sets - 10 reps - Forward and Backward Walking Lunge in [employer]  - 1 x  daily - 1-3 x weekly - 2 sets - 10 reps - Forward Walking Lunge in [employer]  - 1 x daily - 1-3 x weekly - 2 sets - 10 reps - Standing 3-Way Kick with Ankle Float at [employer]  - 1 x daily - 7 x weekly - 3 sets - 10 reps - Lateral Stepping at Pool Wall  - 1 x daily - 7 x weekly - 3 sets - 10 reps - Standing March at [facility]  - 1 x daily - 7 x weekly - 3 sets - 10 reps - Standing Hip Circles at [employer]  - 1 x daily - 7 x weekly - 3 sets - 10  reps - Hip Circles with Arm Swings  - 1 x daily - 7 x weekly - 3 sets - 10 reps - Squat and Arm Circles with Hand Floats  - 1 x daily - 7 x weekly - 3 sets - 10 reps - Figure 8 Arms with Partial Squat  - 1 x daily - 7 x weekly - 3 sets - 10 reps - Squat  - 1 x daily - 7 x weekly - 3 sets - 10 reps ASSESSMENT:  CLINICAL IMPRESSION: Patient presents to aquatic PT session reporting   8/10  hip pain and reducing to 6/10 during session. Session today focused on Left hip and Left shoulder strengthening in the aquatic environment for use of buoyancy to offload joints and the viscosity of water as resistance during therapeutic exercise. Patient continues to benefit from skilled PT services on land and aquatic based and should be progressed as able to improve functional independence. Pt would like HEP to take to area aquatic exercise.  Re-eval: Completed assessment for bilat legs. Knee pain appears OA related while the L hip seems most positive for tendinopathy. Therex were completed for hip mobility and for LE strengthening. A HEP was developed with a written program provided to the pt. Pt tolerated PT today without adverse effects. Pt will continue to benefit from skilled PT to address impairments for improved function with minimized pain. Will assess 5xSTS and the next PT session.  EVAL: Patient is a 53 y.o. 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   OBJECTIVE IMPAIRMENTS: decreased activity tolerance, decreased balance, difficulty walking, decreased strength, postural dysfunction, obesity, and pain.   ACTIVITY LIMITATIONS: carrying, lifting, bending, sitting, standing, squatting, sleeping, stairs, transfers, bathing, toileting, dressing, reach over head, locomotion level, and caring for others  PARTICIPATION LIMITATIONS: meal prep, cleaning, laundry, driving, shopping, community activity, and occupation  PERSONAL FACTORS: Fitness, Past/current experiences, Time since onset of injury/illness/exacerbation, and 3+ comorbidities:    Obesity, fibromyalgia, headache syndrome, DDD, neck pain are also affecting patient's functional outcome.   REHAB POTENTIAL: Good  CLINICAL DECISION MAKING: Evolving/moderate complexity  EVALUATION COMPLEXITY: Moderate   GOALS:  SHORT TERM GOALS: Target date: 05/12/23  Pt will be Ind in an initial HEP  Baseline:started Goal status: MET  2.  Pt will voice understanding of measures to assist in pain reduction  Baseline:  Goal status: MET   LONG TERM GOALS: Target date: 07/01/23  Pt will be Ind in a final HEP to maintain achieved LOF  Baseline: started Goal status: INITIAL  2.  Pt will report 50% or greater improvement for her L shoulder for improved function and QOL. Baseline: 7-9/10 Goal status: INITIAL  3.  Pt will demonstrate the ability to lift 3# OH with good quality of movement for functional use with ADLs  and home activites Baseline: Unable Goal status: INITIAL  4.  Pt's Quick Dash score will improve by the MCID to 73% as indication of improved L shoulder function  Baseline: 89% Goal status: INITIAL  5. Improve 5xSTS by MCID of 5" and by MCID of 34ft as indication of improved functional mobility   Baseline:  TBA  Goal Status: Initial  PLAN:  PT FREQUENCY: 2x/week  PT DURATION: 8 weeks  PLANNED INTERVENTIONS: 16109- PT Re-evaluation,  97110-Therapeutic exercises, 97530- Therapeutic activity, [id number]- Neuromuscular re-education, [phone]- Self Care, 60454- Manual therapy, [phone]- Gait training, [phone]- Electrical stimulation (unattended), [phone]- Ionotophoresis 4mg /ml Dexamethasone, Balance training, Stair training, Taping, Dry Needling, Joint mobilization, Spinal mobilization, Cryotherapy, and Moist heat  PLAN FOR NEXT SESSION: Assess response to HEP; progress therex as indicated; use of modalities, manual therapy; and TPDN as indicated.   Sharlet Dawson, PT, ATRIC Certified Exercise Expert for the Aging Adult  06/01/23 2:58 PM Phone: [phone] Fax: [phone]

## 2023-06-01 ENCOUNTER — Ambulatory Visit: Admitting: Physical Therapy

## 2023-06-01 DIAGNOSIS — M6281 Muscle weakness (generalized): Secondary | ICD-10-CM

## 2023-06-01 DIAGNOSIS — M25512 Pain in left shoulder: Secondary | ICD-10-CM | POA: Diagnosis not present

## 2023-06-01 DIAGNOSIS — M25552 Pain in left hip: Secondary | ICD-10-CM

## 2023-06-01 DIAGNOSIS — G8929 Other chronic pain: Secondary | ICD-10-CM

## 2023-06-01 DIAGNOSIS — R262 Difficulty in walking, not elsewhere classified: Secondary | ICD-10-CM

## 2023-06-01 DIAGNOSIS — M25551 Pain in right hip: Secondary | ICD-10-CM

## 2023-06-01 NOTE — Telephone Encounter (Signed)
 Patient called and notified.

## 2023-06-02 NOTE — Therapy (Signed)
 OUTPATIENT PHYSICAL THERAPY SHOULDER TREATMENT/Lower Extremity TREATMENT    Patient Name: Brittney [name] MRN: [id number] DOB:[birth year], 53 y.o., female Today's Date: 06/08/2023  END OF SESSION:  PT End of Session - 06/08/23 1334     Visit Number 11    Number of Visits 17    Date for PT Re-Evaluation 07/01/23    Authorization Type [employer]IC[employer]; MEDICAID OF [place]- no auth reqd    PT Start Time 1330    PT Stop Time 1415    PT Time Calculation (min) 45 min    Activity Tolerance Patient tolerated treatment well;Patient limited by pain    Behavior During Therapy WFL for tasks assessed/performed                    Past Medical History:  Diagnosis Date   Abscess    to eyelid   Anxiety    Arthritis    KNEES ELBOWS HIPS SHOULDER FINGERS   Asthma    Back pain    Bipolar disorder (HCC)    Depression    BIPOLAR   Dyspnea    Fibromyalgia    GERD (gastroesophageal reflux disease)    Headache syndrome 12/12/2019   Headache(784.0)    MIGRAINES   History of colonoscopy    History of degenerative disc disease    Hyperlipidemia    Major depressive disorder    Memory difficulties 06/21/2014   Neck pain    OA (osteoarthritis) of knee    Obesity    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   Rheumatoid arthritis (HCC)    Seizures (HCC)    FACIAL SEIZURES LAST 4 DAYS AGO   Past Surgical History:  Procedure Laterality Date   CARDIAC CATHETERIZATION  2010   DILATION AND CURETTAGE OF UTERUS  1994   LAPAROSCOPY N/A 11/21/2012   Procedure: LAPAROSCOPY OPERATIVE REMOVAL OF RIGHT TUBE AND OVARY AND FLUID FROM MASS;  Surgeon: Romilda Coaster, MD;  Location: [facility];  Service: Gynecology;  Laterality: N/A;  1 1/2hrs OR time   OOPHORECTOMY     Patient Active Problem List   Diagnosis Date Noted   Weakness of both legs 08/31/2021   Myalgia, multiple sites 08/31/2021   Back pain with radiculopathy 01/07/2021   Hyperlipidemia    OA (osteoarthritis) of knee    GERD  (gastroesophageal reflux disease)    Rheumatoid arthritis (HCC)    Headache syndrome 12/12/2019   Bipolar 1 disorder, depressed, moderate (HCC) 12/14/2018   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   Fibromyalgia 02/03/2015   Tendinitis of both rotator cuffs 02/03/2015   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   Insomnia secondary to chronic pain 11/13/2014   Fatigue due to sleep pattern disturbance 11/13/2014   Memory difficulties 06/21/2014   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   PTSD (post-traumatic stress disorder) 12/13/2013   Bipolar depression (HCC) 12/12/2013   PERICARDIAL EFFUSION 12/23/2008   PSEUDOTUMOR CEREBRI 12/05/2008   UNSPECIFIED PLEURAL EFFUSION 12/05/2008   DYSPNEA 12/05/2008   OBESITY 11/19/2008   Migraine 11/19/2008   ASTHMA 11/19/2008   OTHER SPECIFIED DISORDER OF STOMACH AND DUODENUM 03/11/2008    PCP: Estil Heman, NP   REFERRING PROVIDER: Ngetich, Elijio Guadeloupe, NP   REFERRING DIAG:  [phone] (ICD-10-CM) - Chronic left shoulder pain  M17.0 (ICD-10-CM) - Primary osteoarthritis of both knees  M05.751,M05.752 (ICD-10-CM) - 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 Pt is getting xray after pool today.   EVal -Pt requested her L shoulder pain be addressed first. Pt reports her L shoulder has  been hurting since January when she started exercising more often with both aquatics and a trainer. Currently, she is not working with the trainer and she finds water exercises help to decrease the L shoulder pain. In 2019, she states she had a labral tear. Pt notes she is to receive a cortisone injection for the L shoulder on Friday. Pt received cortisone injections of both knees yesterday and they feel much better. Overall, she reports her physical current issues are debilitating.  Hand dominance: Right  PERTINENT HISTORY: Obesity, fibromyalgia, headache syndrome, DDD, neck pain  PAIN:  Are you having pain? Yes: NPRS scale: 0/10. Pain range the week prior to start of PT:7-9/10 Pain location: Anterior L shoulder Pain description: burning, sharp Aggravating factors: Use of it Relieving factors: Water therapy  Knees: 0/10  L hip: 3/10  PRECAUTIONS: None  RED FLAGS: None   WEIGHT BEARING RESTRICTIONS: No  FALLS:  Has patient fallen in last 6 months? Yes. Number of falls 1 Sharp pain with feet and dropped  LIVING ENVIRONMENT: Lives with: lives with their family Lives in: House/apartment Able to access home  OCCUPATION: Geophysicist/field seismologist  PLOF: Independent  PATIENT GOALS:Pain relief, improved function  NEXT MD VISIT:   OBJECTIVE:  Note: Objective measures were completed at Evaluation unless otherwise noted.  DIAGNOSTIC FINDINGS:  Xray R shoulder 07/16/22 IMPRESSION: Mild to moderate glenohumeral joint degeneration. No acute osseous abnormality identified about the left shoulder.  Xray R knee 04/26/23 X-rays demonstrate moderate medial and patellofemoral degenerative changes  Xray L knee 04/26/23 X-rays demonstrate moderate medial and patellofemoral degenerative changes   PATIENT SURVEYS:  Quick Dash 47/55=89% disability  COGNITION: Overall cognitive status: Within functional limits for tasks assessed     SENSATION: WFL  POSTURE: Forward head and rounded  shoulder  UPPER EXTREMITY ROM:   All L shoulder AROMs were painful with appropriate ROM. Pt completes at a slow pace Active ROM Right eval Left eval Left 05/02/23  LT 05/11/23 Lt 05/23/23  Shoulder flexion  130 140 165 165  Shoulder extension       Shoulder abduction  130 160  165 painful arc  Shoulder adduction       Shoulder internal rotation  T9     Shoulder external rotation  T4     Elbow flexion       Elbow extension       Wrist flexion       Wrist extension       Wrist ulnar deviation       Wrist radial deviation       Wrist pronation       Wrist supination       (Blank rows = not tested)  UPPER EXTREMITY MMT:  L shoulder strength was 4 to 4+/5 in a neutral position without significant increase in pain MMT Right eval Left eval  Shoulder flexion    Shoulder extension    Shoulder abduction    Shoulder adduction    Shoulder internal rotation    Shoulder external rotation    Middle trapezius    Lower trapezius    Elbow flexion    Elbow extension    Wrist flexion    Wrist extension    Wrist ulnar deviation    Wrist radial deviation    Wrist pronation    Wrist supination    Grip strength (lbs)    (Blank rows = not tested)  SHOULDER SPECIAL TESTS: Impingement tests: Hawkins/Kennedy impingement test: negative SLAP lesions: Crank test: negative and Biceps load test: negative Rotator cuff assessment: Empty can test: negative and Full can test: negative Biceps assessment: Speed's test: negative  JOINT MOBILITY TESTING:  Stable GH jt  PALPATION:  TTP to the L upper trap  TTP to the L lateral greater trochanter LE Assessement LE ROM:  WNLS Active ROM Right Eval Left Eval  Hip flexion    Hip extension    Hip abduction    Hip adduction    Hip internal rotation    Hip external rotation    Knee flexion    Knee extension    Ankle dorsiflexion    Ankle plantarflexion    Ankle inversion    Ankle eversion     (Blank rows = not tested)  LE MMT:  MMT  Right Eval Left Eval  Hip flexion 4+ 4  Hip extension 4 3  Hip abduction 4+ 4  Hip adduction    Hip internal rotation    Hip external rotation 4+ 4  Knee flexion 5 5  Knee extension 5 5  Ankle dorsiflexion    Ankle plantarflexion    Ankle inversion    Ankle eversion     (Blank rows = not tested)  LOWER EXTREMITY SPECIAL TESTS:  Hip special tests: Portia Brittle (FABER) test: positive   FUNCTIONAL TESTS:  5 times sit to stand: TBA 2 minute walk test: TBA 05/09/23:  5 x STS with UE support : 15.3 sec  05/09/23: 474 feet   GAIT: Distance walked: 200' Assistive device utilized: None Level of assistance: Complete Independence Comments: WNLs  TREATMENT DATE: [facility] Adult PT Treatment:                                                DATE: 06-08-23 Aquatic therapy at [facility] [address] - therapeutic pool temp 92 degrees Pt enters building independently.  Treatment took place in water 3.8 to  4 ft 8 in.feet deep depending upon activity.  Pt entered and exited the pool via stair and handrails   Initiation of RX  Left shoulder 3/10 and Left hip 5/10 Aquatic Therapy:  Water walking for warm up fwd, sidestepping Access Code: [email or web address] HEP provided and reviewed  with laminated sheets  Hip Flexion Extension   Hip Abduction  Forward and Backward Walking Lunge  Standing March at [facility] and adding pool stomp with yellow noodle Side Stepping  with aquatic cuffs bil and using yellow aquatic DB Standing 3-Way Kick with Ankle Float at [employer] Hip Circles at [employer] Hip Internal and External Rotation   Hip Circles with Arm Swings   Squat and Arm Circles with Hand Floats  - 1 x daily - 7 x weekly - 3 sets - 10 reps Figure 8 Arms with Partial Squat   Squat   Seated Piriformis Stretch with Trunk Bend   Abdominal Curls Center with Upper  Extremity Flotation  Abdominal Curls Diagonal with Upper Extremity Flotations  Suspended [employer] Ski with Foam Dumbbells and Ankle Floats  Pt guided in pool by PT for proper execution of each exercise  Pt requires the buoyancy of water for active assisted exercises with buoyancy supported for strengthening and AROM exercises. Hydrostatic pressure also supports joints by unweighting joint load by at least 50 % in 3-4 feet depth water. 80% in chest to neck deep water. Water will provide assistance with movement using the current and laminar flow while the buoyancy reduces weight bearing. Pt requires the viscosity of the water for resistance with strengthening exercises.  [facility] Adult PT Treatment:                                                DATE: 06-01-23 Aquatic therapy at [facility] [address] - therapeutic pool temp 92 degrees Pt enters building independently.  Treatment took place in water 3.8 to  4 ft 8 in.feet deep depending upon activity.  Pt entered and exited the pool via stair and handrails   Initiation of RX  Left shoulder 4/10 and Left hip 8/10 Aquatic Therapy:  Water walking for warm up fwd/lat/bkwds At initiation of aquatic therapy pt received aqua stretch for pectorals of left shoulder and left hip with reduced sharpness in pain.  Squats Standing rows UE with yellow resistant bells Standing UE horizontal abduction with yellow bells Standing  UEabduction/adduction with yellow bells Hip extension Hip abd Heel raise Walking march Hip flexion ext - knee straight Steps ups fwd and lat Lateral walking with squats and shoulder adduction with rainbow DB Runners stretch bottom step x30" BIL Hamstring stretch bottom step x30" BIL Figure 8 arm circles   Pt requires the buoyancy of water for active assisted exercises with buoyancy supported for strengthening and AROM exercises. Hydrostatic pressure also supports joints by unweighting joint  load by at least 50 % in 3-4  feet depth water. 80% in chest to neck deep water. Water will provide assistance with movement using the current and laminar flow while the buoyancy reduces weight bearing. Pt requires the viscosity of the water for resistance with strengthening exercises.   [facility] Adult PT Treatment:                                                DATE: 05/31/2023 Manual Therapy: STM to the piriformis and gluteal muscles with TPR Therapeutic Exercise: Piriformis and figure 4 stretches x2 30" each Banded Bridge x15 3"  SLR c AB engage x15, pilates ring Therapeutic Activity: Nustep L5 6 mins UE/LE Banded side steps BluTB Banded wall squats 2x8 BluTB Modalities: Iontophoresis 4 mg/ml 6 hours, dexamethasone  Advised re: skin irritation from adhesive and not to get the patch wet  [facility] Adult PT Treatment:                                                DATE: 05/25/23 Therapeutic Exercise: Piriformis and figure 4 stretches x2 30" each SL clams in supine 2x10 3" GTB Banded Bridge x15 3"  Side lying Hip abduction 2x10 each  SLR c AB engage x15, pilates ring STS x 10  LAQ GTB 10 x 2 each +HEP Self Care: Use of tennis ball for gluteal massage in sitting and standing   [facility] Adult PT Treatment:                                                DATE: 05/23/23 Therapeutic Exercise: Side shoulder abduction 10 x 2 +HEP Supine shoulder horizontal abduction GTB 10 x 2  Bridge (SL) 10 x 2 each +HEP Side lying Hip abduction x 10 each  STS x 10 -R knee pain  LAQ GTB 10 x 2 each +HEP Standing hip abduction AROM, then added red band - increased pain left hip Shoulder Row black  Shoulder ext Green  Shoulder ER/IR GTB x 10 each Updated HEP Modalities: Ice pack Left shoulder x 10 minutes   PATIENT EDUCATION: Education details: Eval findings, POC, HEP Person educated: Patient Education method: Explanation, Demonstration, Tactile cues, and Verbal cues Education comprehension: verbalized understanding, returned  demonstration, verbal cues required, and tactile cues required  HOME EXERCISE PROGRAM: Access Code: [email or web address] URL: [email or web address] Date: 05/04/2023 Prepared by: Liborio Reeds  Exercises - Flexion-Extension Shoulder Pendulum with Table Support  - 3 x daily - 7 x weekly - 1 sets - 20 reps - Seated Piriformis Stretch  - 1 x daily - 7 x weekly - 1 sets - 3 reps - 30 hold - Seated Piriformis Stretch with Trunk Bend  - 1 x daily - 7 x weekly - 1 sets - 3 reps - 30 hold - Supine Piriformis Stretch with Foot on Ground  - 1 x daily - 7 x weekly - 1 sets - 3 reps - 30 hold - Supine Figure 4 Piriformis Stretch  - 1 x daily - 7 x weekly - 1 sets - 3 reps - 30 hold - Active  Straight Leg Raise with Quad Set  - 1 x daily - 7 x weekly - 2 sets - 10 reps - 3 hold - Supine Bridge with Resistance Band  - 1 x daily - 7 x weekly - 2 sets - 10 reps - 2 hold - Hooklying Clamshell with Resistance  - 1 x daily - 7 x weekly - 2 sets - 10 reps - 2 hold - Supine March with Resistance Band  - 1 x daily - 7 x weekly - 2 sets - 10 reps - 2 hold Added 3/24 - Standing Bilateral Low Shoulder Row with Anchored Resistance  - 1 x daily - 7 x weekly - 2 sets - 10 reps - Shoulder extension with resistance - Neutral  - 1 x daily - 7 x weekly - 2 sets - 10 reps - Shoulder Internal Rotation with Resistance  - 1 x daily - 7 x weekly - 2 sets - 10 reps - Shoulder External Rotation with Anchored Resistance  - 1 x daily - 7 x weekly - 2 sets - 10 reps  Aquatic program Access Code: [email or web address] URL: [email or web address] Date: 06/02/2023 Prepared by: Sharlet Dawson  Exercises - Standing Hip Flexion Extension  - 1 x daily - 7 x weekly - 3 sets - 10 reps - Standing Hip Abduction  - 1 x daily - 7 x weekly - 3 sets - 10 reps - Forward and Backward Walking Lunge in Shallow Water  - 1 x daily - 1-3 x weekly - 2 sets - 10 reps - Standing March at [facility]  - 1 x daily - 7 x weekly - 3 sets - 10 reps -  Side Stepping  - 1 x daily - 7 x weekly - 3 sets - 10 reps - Standing 3-Way Kick with Ankle Float at [employer]  - 1 x daily - 7 x weekly - 3 sets - 10 reps - Standing Hip Circles at [employer]  - 1 x daily - 7 x weekly - 3 sets - 10 reps - Standing Hip Internal and External Rotation  - 1 x daily - 7 x weekly - 3 sets - 10 reps - Hip Circles with Arm Swings  - 1 x daily - 7 x weekly - 3 sets - 10 reps - Squat and Arm Circles with Hand Floats  - 1 x daily - 7 x weekly - 3 sets - 10 reps - Figure 8 Arms with Partial Squat  - 1 x daily - 7 x weekly - 3 sets - 10 reps - Squat  - 1 x daily - 7 x weekly - 3 sets - 10 reps - Seated Piriformis Stretch with Trunk Bend  - 1 x daily - 7 x weekly - 3 sets - 10 reps - Abdominal Curls Center with Upper Extremity Flotation  - 1 x daily - 7 x weekly - 3 sets - 10 reps - Abdominal Curls Diagonal with Upper Extremity Flotation  - 1 x daily - 7 x weekly - 3 sets - 10 reps - Suspended [employer] Ski with Foam Dumbbells and Ankle Floats  - 1 x daily - 7 x weekly - 3 sets - 10 reps ASSESSMENT:  CLINICAL IMPRESSION: 06-08-23 Patient presents to aquatic PT session reporting  5/10  hip pain  and 3/10 shoulder but also seemed more discouraged today dealing with pain although level of pain reduced today but she reports pain as constant.   Pt will get x ray  after pool today. Session today focused on Left hip and Left shoulder strengthening in the aquatic environment for use of buoyancy to offload joints and the viscosity of water as resistance during therapeutic exercise. Patient continues to benefit from skilled PT services on land and aquatic based and should be progressed as able to improve functional independence. Pt worked and reviewed aquatic HEP with laminated copies to take home after review today.  Re-eval: Completed assessment for bilat legs. Knee pain appears OA related while the L hip seems most positive for tendinopathy. Therex were completed for hip mobility and  for LE strengthening. A HEP was developed with a written program provided to the pt. Pt tolerated PT today without adverse effects. Pt will continue to benefit from skilled PT to address impairments for improved function with minimized pain. Will assess 5xSTS and the next PT session.  EVAL: Patient is a 53 y.o. 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   OBJECTIVE IMPAIRMENTS: decreased activity tolerance, decreased balance, difficulty walking, decreased strength, postural dysfunction, obesity, and pain.   ACTIVITY LIMITATIONS: carrying, lifting, bending, sitting, standing, squatting, sleeping, stairs, transfers, bathing, toileting, dressing, reach over head, locomotion level, and caring for others  PARTICIPATION LIMITATIONS: meal prep, cleaning, laundry, driving, shopping, community activity, and occupation  PERSONAL FACTORS: Fitness, Past/current experiences, Time since onset of injury/illness/exacerbation, and 3+ comorbidities:    Obesity, fibromyalgia, headache syndrome, DDD, neck pain are also affecting patient's functional outcome.   REHAB POTENTIAL: Good  CLINICAL DECISION MAKING: Evolving/moderate complexity  EVALUATION COMPLEXITY: Moderate   GOALS:  SHORT TERM GOALS: Target date: 05/12/23  Pt will be Ind in an initial HEP  Baseline:started Goal status: MET  2.  Pt will voice understanding of measures to assist in pain reduction  Baseline:  Goal status: MET   LONG TERM GOALS: Target date: 07/01/23  Pt will be Ind in a final HEP to maintain achieved LOF  Baseline: started Goal status: INITIAL  2.  Pt will report 50% or greater improvement for her L shoulder for improved function and QOL. Baseline:  7-9/10 Goal status: INITIAL  3.  Pt will demonstrate the ability to lift 3# OH with good quality of movement for functional use with ADLs  and home activites Baseline: Unable Goal status: INITIAL  4.  Pt's Quick Dash score will improve by the MCID to 73% as indication of improved L shoulder function  Baseline: 89% Goal status: INITIAL  5. Improve 5xSTS by MCID of 5" and by MCID of 57ft as indication of improved functional mobility   Baseline: TBA  Goal Status: Initial  PLAN:  PT FREQUENCY: 2x/week  PT DURATION: 8 weeks  PLANNED INTERVENTIONS: 97164- PT Re-evaluation, 97110-Therapeutic exercises, 97530- Therapeutic activity, [id number]- Neuromuscular re-education, 97535- Self Care, [id number]- Manual therapy, [id number]- Gait training, [phone]- Electrical stimulation (unattended), [phone]- Ionotophoresis 4mg /ml Dexamethasone , Balance training, Stair training, Taping, Dry Needling, Joint mobilization, Spinal mobilization, Cryotherapy, and Moist heat  PLAN FOR NEXT SESSION: Assess response to HEP; progress therex as indicated; use of modalities, manual therapy; and TPDN as indicated.   Sharlet Dawson, PT, ATRIC Certified Exercise Expert for the Aging Adult  06/08/23 2:16 PM Phone: [phone] Fax: [phone]

## 2023-06-08 ENCOUNTER — Ambulatory Visit
Admission: RE | Admit: 2023-06-08 | Discharge: 2023-06-08 | Disposition: A | Discharge status: Home / Self Care | Source: Ambulatory Visit | Attending: Family | Admitting: Family

## 2023-06-08 ENCOUNTER — Encounter: Payer: Self-pay | Admitting: Physical Therapy

## 2023-06-08 ENCOUNTER — Ambulatory Visit: Admitting: Physical Therapy

## 2023-06-08 DIAGNOSIS — M25512 Pain in left shoulder: Secondary | ICD-10-CM | POA: Diagnosis not present

## 2023-06-08 DIAGNOSIS — R262 Difficulty in walking, not elsewhere classified: Secondary | ICD-10-CM

## 2023-06-08 DIAGNOSIS — M25551 Pain in right hip: Secondary | ICD-10-CM

## 2023-06-08 DIAGNOSIS — M25552 Pain in left hip: Secondary | ICD-10-CM

## 2023-06-08 DIAGNOSIS — G8929 Other chronic pain: Secondary | ICD-10-CM

## 2023-06-08 DIAGNOSIS — M6281 Muscle weakness (generalized): Secondary | ICD-10-CM

## 2023-06-09 ENCOUNTER — Telehealth: Payer: Self-pay | Admitting: Internal Medicine

## 2023-06-09 ENCOUNTER — Other Ambulatory Visit: Payer: Self-pay | Admitting: Family

## 2023-06-09 DIAGNOSIS — M1612 Unilateral primary osteoarthritis, left hip: Secondary | ICD-10-CM

## 2023-06-09 NOTE — Telephone Encounter (Signed)
 See lab results.

## 2023-06-09 NOTE — Telephone Encounter (Signed)
 Noted.

## 2023-06-09 NOTE — Telephone Encounter (Signed)
 Filling in at the office today- pt would like a request call back when her imaging comes back  Copied from CRM [id number]. Topic: General - Other >> Jun 09, 2023  1:41 PM Brittney [name] wrote: Reason for CRM: DG HIP UNILAT W OR W/O PELVIS 2-3 VIEWS RIGHT (Accession [id number]) (Order [id number])BM Hip Unilat W OR W/O Pelvis 2-3 Views Left (Accession [id number]) (Order [id number] called in on results from xray. Needs call back. [phone]

## 2023-06-10 ENCOUNTER — Ambulatory Visit

## 2023-06-10 DIAGNOSIS — G8929 Other chronic pain: Secondary | ICD-10-CM

## 2023-06-10 DIAGNOSIS — M6281 Muscle weakness (generalized): Secondary | ICD-10-CM

## 2023-06-10 DIAGNOSIS — M25552 Pain in left hip: Secondary | ICD-10-CM

## 2023-06-10 DIAGNOSIS — M25512 Pain in left shoulder: Secondary | ICD-10-CM | POA: Diagnosis not present

## 2023-06-10 DIAGNOSIS — R262 Difficulty in walking, not elsewhere classified: Secondary | ICD-10-CM

## 2023-06-10 NOTE — Therapy (Signed)
 OUTPATIENT PHYSICAL THERAPY SHOULDER TREATMENT/Lower Extremity TREATMENT    Patient Name: Brittney [name] MRN: [id number] DOB:[birth year], 53 y.o., female Today's Date: 06/10/2023  END OF SESSION:  PT End of Session - 06/10/23 1011     Visit Number 11    Number of Visits 17    Date for PT Re-Evaluation 07/01/23    Authorization Type [employer]; MEDICAID OF [place]- no auth reqd    PT Start Time 0933    PT Stop Time 1015    PT Time Calculation (min) 42 min    Activity Tolerance Patient tolerated treatment well    Behavior During Therapy WFL for tasks assessed/performed                     Past Medical History:  Diagnosis Date   Abscess    to eyelid   Anxiety    Arthritis    KNEES ELBOWS HIPS SHOULDER FINGERS   Asthma    Back pain    Bipolar disorder (HCC)    Depression    BIPOLAR   Dyspnea    Fibromyalgia    GERD (gastroesophageal reflux disease)    Headache syndrome 12/12/2019   Headache(784.0)    MIGRAINES   History of colonoscopy    History of degenerative disc disease    Hyperlipidemia    Major depressive disorder    Memory difficulties 06/21/2014   Neck pain    OA (osteoarthritis) of knee    Obesity    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   Rheumatoid arthritis (HCC)    Seizures (HCC)    FACIAL SEIZURES LAST 4 DAYS AGO   Past Surgical History:  Procedure Laterality Date   CARDIAC CATHETERIZATION  2010   DILATION AND CURETTAGE OF UTERUS  1994   LAPAROSCOPY N/A 11/21/2012   Procedure: LAPAROSCOPY OPERATIVE REMOVAL OF RIGHT TUBE AND OVARY AND FLUID FROM MASS;  Surgeon: Romilda Coaster, MD;  Location: [facility];  Service: Gynecology;  Laterality: N/A;  1 1/2hrs OR time   OOPHORECTOMY     Patient Active Problem List   Diagnosis Date Noted   Weakness of both legs 08/31/2021   Myalgia, multiple sites 08/31/2021   Back pain with radiculopathy 01/07/2021   Hyperlipidemia    OA (osteoarthritis) of knee    GERD (gastroesophageal reflux  disease)    Rheumatoid arthritis (HCC)    Headache syndrome 12/12/2019   Bipolar 1 disorder, depressed, moderate (HCC) 12/14/2018   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   Fibromyalgia 02/03/2015   Tendinitis of both rotator cuffs 02/03/2015   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   Insomnia secondary to chronic pain 11/13/2014   Fatigue due to sleep pattern disturbance 11/13/2014   Memory difficulties 06/21/2014   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   PTSD (post-traumatic stress disorder) 12/13/2013   Bipolar depression (HCC) 12/12/2013   PERICARDIAL EFFUSION 12/23/2008   PSEUDOTUMOR CEREBRI 12/05/2008   UNSPECIFIED PLEURAL EFFUSION 12/05/2008   DYSPNEA 12/05/2008   OBESITY 11/19/2008   Migraine 11/19/2008   ASTHMA 11/19/2008   OTHER SPECIFIED DISORDER OF STOMACH AND DUODENUM 03/11/2008    PCP: Estil Heman, NP   REFERRING PROVIDER: Ngetich, Elijio Guadeloupe, NP   REFERRING DIAG:  [phone] (ICD-10-CM) - Chronic left shoulder pain  M17.0 (ICD-10-CM) - Primary osteoarthritis of both knees  M05.751,M05.752 (ICD-10-CM) - 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   EVal -Pt requested her L shoulder pain be addressed first. Pt reports her L shoulder has been hurting since January when she started  exercising more often with both aquatics and a trainer. Currently, she is not working with the trainer and she finds water exercises help to decrease the L shoulder pain. In 2019, she states she had a labral tear. Pt notes she is to receive a cortisone injection for the L shoulder on Friday. Pt received cortisone injections of both knees yesterday and they feel much better. Overall, she reports her physical current issues are debilitating.  Hand dominance: Right  PERTINENT HISTORY: Obesity, fibromyalgia, headache syndrome, DDD, neck pain  PAIN:  Are you having pain? Yes: NPRS scale: 0/10. Pain range the week prior to start of PT:7-9/10 Pain location: Anterior L shoulder Pain description: burning, sharp Aggravating factors: Use of it Relieving factors: Water therapy  Knees: 0/10  L hip: 6/10  PRECAUTIONS: None  RED FLAGS: None   WEIGHT BEARING RESTRICTIONS: No  FALLS:  Has patient fallen in last 6 months? Yes. Number of falls 1 Sharp pain with feet and dropped  LIVING ENVIRONMENT: Lives with: lives with their family Lives in: House/apartment Able to access home  OCCUPATION: Geophysicist/field seismologist  PLOF: Independent  PATIENT GOALS:Pain relief, improved function  NEXT MD VISIT:   OBJECTIVE:  Note: Objective measures were completed at Evaluation unless otherwise noted.  DIAGNOSTIC FINDINGS:  Xray R shoulder 07/16/22 IMPRESSION: Mild to moderate glenohumeral joint degeneration. No acute osseous abnormality identified about the left shoulder.  Xray R knee 04/26/23 X-rays demonstrate moderate medial and patellofemoral degenerative changes  Xray L knee 04/26/23 X-rays demonstrate moderate medial and patellofemoral degenerative changes   L hip DG 06/08/23 FINDINGS: The bilateral sacroiliac and femoroacetabular joint spaces are maintained. Mild superolateral left acetabular degenerative osteophytosis is similar to prior.   There is moderate to severe pubic symphysis  joint space narrowing with subchondral cystic change and high-grade subchondral sclerosis.   IMPRESSION: 1. Mild left femoroacetabular osteoarthritis. 2. 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  PATIENT SURVEYS:  Brittney [name]  47/55=89% disability  COGNITION: Overall cognitive status: Within functional limits for tasks assessed     SENSATION: WFL  POSTURE: Forward head and rounded shoulder  UPPER EXTREMITY ROM:   All L shoulder AROMs were painful with appropriate ROM. Pt completes at a slow pace Active ROM Right eval Left eval Left 05/02/23 LT 05/11/23 Lt 05/23/23  Shoulder flexion  130 140 165 165  Shoulder extension       Shoulder abduction  130 160  165 painful arc  Shoulder adduction       Shoulder internal rotation  T9     Shoulder external rotation  T4     Elbow flexion       Elbow extension       Wrist flexion       Wrist extension       Wrist ulnar deviation       Wrist radial deviation       Wrist pronation       Wrist supination       (Blank rows = not tested)  UPPER EXTREMITY MMT:  L shoulder strength was 4 to 4+/5 in a neutral position without significant increase in pain MMT Right eval Left eval  Shoulder flexion    Shoulder extension    Shoulder abduction    Shoulder adduction    Shoulder internal rotation    Shoulder external rotation    Middle trapezius    Lower trapezius    Elbow flexion    Elbow extension    Wrist flexion    Wrist extension    Wrist ulnar deviation    Wrist radial deviation    Wrist pronation    Wrist supination    Grip strength (lbs)    (Blank rows = not tested)  SHOULDER SPECIAL TESTS: Impingement tests: Hawkins/Kennedy impingement test: negative SLAP lesions: Crank test: negative and Biceps load test: negative Rotator cuff assessment: Empty can test: negative and Full can test: negative Biceps assessment: Speed's test: negative  JOINT MOBILITY TESTING:  Stable GH  jt  PALPATION:  TTP to the L upper trap  TTP to the L lateral greater trochanter LE Assessement LE ROM:  WNLS Active ROM Right Eval Left Eval  Hip flexion    Hip extension    Hip abduction    Hip adduction    Hip internal rotation    Hip external rotation    Knee flexion    Knee extension    Ankle dorsiflexion    Ankle plantarflexion    Ankle inversion    Ankle eversion     (Blank rows = not tested)  LE MMT:  MMT Right Eval Left Eval  Hip flexion 4+ 4  Hip extension 4 3  Hip abduction 4+ 4  Hip adduction    Hip internal rotation    Hip external rotation 4+ 4  Knee flexion 5 5  Knee extension 5 5  Ankle dorsiflexion    Ankle plantarflexion    Ankle inversion    Ankle eversion     (Blank rows = not tested)  LOWER EXTREMITY SPECIAL TESTS:  Hip special tests: Portia Brittle (FABER) test: positive   FUNCTIONAL TESTS:  5 times sit to stand: TBA 2 minute walk test: TBA 05/09/23:  5 x STS with UE support : 15.3 sec  05/09/23: 474 feet   GAIT: Distance walked: 200' Assistive device utilized: None Level of assistance: Complete Independence Comments: WNLs  TREATMENT DATE: [facility] Adult PT Treatment:                                                DATE: 06/10/23 Manual Therapy: L hip LAD Therapeutic Exercise: Piriformis and figure 4 stretches x2 30" each Banded Bridge x15 3" GTB SL clams each x15 GTB SLR c AB engage x15, pilates ring Hip add sets c ball squeeze x15 Self Care: Discussed exercise options and the importance of continuing to ex at an appropriate level for hip health and managing pain Instruction on self LAD for the L hip at home  [facility] Adult PT Treatment:                                                DATE: 06-08-23 Aquatic therapy at [facility]- [address] - therapeutic pool temp 92 degrees Pt enters building independently.   Treatment took place in water 3.8 to  4 ft 8 in.feet deep depending upon activity.  Pt entered and exited the pool via stair and handrails   Initiation of RX  Left shoulder 3/10 and Left hip 5/10 Aquatic Therapy:  Water walking for warm up fwd, sidestepping Access Code: [email or web address] HEP provided and reviewed  with laminated sheets  Hip Flexion Extension   Hip Abduction  Forward and Backward Walking Lunge  Standing March at [facility] and adding pool stomp with yellow noodle Side Stepping  with aquatic cuffs bil and using yellow aquatic DB Standing 3-Way Kick with Ankle Float at [employer] Hip Circles at [employer] Hip Internal and External Rotation   Hip Circles with Arm Swings   Squat and Arm Circles with Hand Floats  - 1 x daily - 7 x weekly - 3 sets - 10 reps Figure 8 Arms with Partial Squat   Squat   Seated Piriformis Stretch with Trunk Bend   Abdominal Curls Center with Upper Extremity Flotation  Abdominal Curls Diagonal with Upper Extremity Flotations  Suspended [employer] Ski with Foam Dumbbells and Ankle Floats  Pt guided in pool by PT for proper execution of each exercise  Pt requires the buoyancy of water for active assisted exercises with buoyancy supported for strengthening and AROM exercises. Hydrostatic pressure also supports joints by unweighting joint load by at least 50 % in 3-4 feet depth water. 80% in chest to neck deep water. Water will provide assistance with movement using the current and laminar flow while the buoyancy reduces weight bearing. Pt requires the viscosity of the water for resistance with strengthening exercises.  PATIENT EDUCATION: Education details: Eval findings, POC, HEP Person educated: Patient Education method: Explanation, Demonstration, Tactile cues, and Verbal cues Education comprehension: verbalized understanding, returned demonstration, verbal cues required, and tactile cues required  HOME EXERCISE PROGRAM: Access Code:  [email or web address] URL: [email or web address] Date: 05/04/2023 Prepared by: Liborio Reeds  Exercises - Flexion-Extension Shoulder Pendulum with Table Support  - 3 x daily - 7 x weekly - 1 sets - 20 reps - Seated Piriformis Stretch  - 1 x daily - 7 x weekly - 1 sets - 3 reps - 30 hold - Seated Piriformis Stretch with Trunk Bend  - 1 x daily - 7 x weekly -  1 sets - 3 reps - 30 hold - Supine Piriformis Stretch with Foot on Ground  - 1 x daily - 7 x weekly - 1 sets - 3 reps - 30 hold - Supine Figure 4 Piriformis Stretch  - 1 x daily - 7 x weekly - 1 sets - 3 reps - 30 hold - Active Straight Leg Raise with Quad Set  - 1 x daily - 7 x weekly - 2 sets - 10 reps - 3 hold - Supine Bridge with Resistance Band  - 1 x daily - 7 x weekly - 2 sets - 10 reps - 2 hold - Hooklying Clamshell with Resistance  - 1 x daily - 7 x weekly - 2 sets - 10 reps - 2 hold - Supine March with Resistance Band  - 1 x daily - 7 x weekly - 2 sets - 10 reps - 2 hold Added 3/24 - Standing Bilateral Low Shoulder Row with Anchored Resistance  - 1 x daily - 7 x weekly - 2 sets - 10 reps - Shoulder extension with resistance - Neutral  - 1 x daily - 7 x weekly - 2 sets - 10 reps - Shoulder Internal Rotation with Resistance  - 1 x daily - 7 x weekly - 2 sets - 10 reps - Shoulder External Rotation with Anchored Resistance  - 1 x daily - 7 x weekly - 2 sets - 10 reps  Aquatic program Access Code: [email or web address] URL: [email or web address] Date: 06/02/2023 Prepared by: Sharlet Dawson  Exercises - Standing Hip Flexion Extension  - 1 x daily - 7 x weekly - 3 sets - 10 reps - Standing Hip Abduction  - 1 x daily - 7 x weekly - 3 sets - 10 reps - Forward and Backward Walking Lunge in Shallow Water  - 1 x daily - 1-3 x weekly - 2 sets - 10 reps - Standing March at [facility]  - 1 x daily - 7 x weekly - 3 sets - 10 reps - Side Stepping  - 1 x daily - 7 x weekly - 3 sets - 10 reps - Standing 3-Way Kick with Ankle Float at [employer]  - 1 x daily - 7 x weekly - 3 sets - 10 reps - Standing Hip Circles at [employer]  - 1 x daily - 7 x weekly - 3 sets - 10 reps - Standing Hip Internal and External Rotation  - 1 x daily - 7 x weekly - 3 sets - 10 reps - Hip Circles with Arm Swings  - 1 x daily - 7 x weekly - 3 sets - 10 reps - Squat and Arm Circles with Hand Floats  - 1 x daily - 7 x weekly - 3 sets - 10 reps - Figure 8 Arms with Partial Squat  - 1 x daily - 7 x weekly - 3 sets - 10 reps - Squat  - 1 x daily - 7 x weekly - 3 sets - 10 reps - Seated Piriformis Stretch with Trunk Bend  - 1 x daily - 7 x weekly - 3 sets - 10 reps - Abdominal Curls Center with Upper Extremity Flotation  - 1 x daily - 7 x weekly - 3 sets - 10 reps - Abdominal Curls Diagonal with Upper Extremity Flotation  - 1 x daily - 7 x weekly - 3 sets - 10 reps - Suspended [employer] Ski with Foam Dumbbells and Ankle Floats  - 1 x  daily - 7 x weekly - 3 sets - 10 reps ASSESSMENT:  CLINICAL IMPRESSION: 06-10-23 Provided education re: the importance and benefits of continuing exercise with arthritis of the hip. LAD was then completed with pt reporting relief with technique. Pt was instructed in how to complete at home, and voiced understanding. Exercises were then completed for pelvic flexibility and strength. Pt tolerated PT today without adverse effects. Pt will continue to benefit from skilled PT to address impairments for improved L hip function with minimized.     Re-eval: Completed assessment for bilat legs. Knee pain appears OA related while the L hip seems most positive for tendinopathy. Therex were completed for hip mobility and for LE strengthening. A HEP was developed with a written program provided to the pt. Pt tolerated PT today without adverse effects. Pt will continue to benefit from skilled PT to address impairments for improved function with minimized pain. Will assess 5xSTS and the next PT session.  EVAL: Patient is a 53 y.o. 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   OBJECTIVE IMPAIRMENTS: decreased activity tolerance, decreased balance, difficulty walking, decreased strength, postural dysfunction, obesity, and pain.   ACTIVITY LIMITATIONS: carrying, lifting, bending, sitting, standing, squatting, sleeping, stairs, transfers, bathing, toileting, dressing, reach over head, locomotion level, and caring for others  PARTICIPATION LIMITATIONS: meal prep, cleaning, laundry, driving, shopping, community activity, and occupation  PERSONAL FACTORS: Fitness, Past/current experiences, Time since onset of injury/illness/exacerbation, and 3+ comorbidities:    Obesity, fibromyalgia, headache syndrome, DDD, neck pain are also affecting patient's functional outcome.   REHAB POTENTIAL: Good  CLINICAL DECISION MAKING: Evolving/moderate complexity  EVALUATION COMPLEXITY: Moderate   GOALS:  SHORT TERM GOALS: Target date: 05/12/23  Pt will be Ind in an initial HEP  Baseline:started Goal status: MET  2.  Pt will voice understanding of measures to assist in pain reduction  Baseline:  Goal status: MET   LONG TERM GOALS: Target date: 07/01/23  Pt will be Ind in a final HEP to maintain achieved LOF  Baseline: started Goal status: INITIAL  2.  Pt will report 50% or greater improvement for her L shoulder for improved function and QOL. Baseline: 7-9/10 Goal status: INITIAL  3.  Pt will demonstrate the ability to lift 3# OH with good quality of movement for functional use with ADLs  and home activites Baseline: Unable Goal status: INITIAL  4.  Pt's Quick Dash score will improve by the MCID to 73% as indication of improved L shoulder function  Baseline: 89% Goal status:  INITIAL  5. Improve 5xSTS by MCID of 5" and by MCID of 60ft as indication of improved functional mobility   Baseline: TBA  Goal Status: Initial  PLAN:  PT FREQUENCY: 2x/week  PT DURATION: 8 weeks  PLANNED INTERVENTIONS: [id number]- PT Re-evaluation, 97110-Therapeutic exercises, 97530- Therapeutic activity, [id number]- Neuromuscular re-education, 97535- Self Care, 10272- Manual therapy, [id number]- Gait training, [phone]- Electrical stimulation (unattended), [phone]- Ionotophoresis 4mg /ml Dexamethasone , Balance training, Stair training, Taping, Dry Needling, Joint mobilization, Spinal mobilization, Cryotherapy, and Moist heat  PLAN FOR NEXT SESSION: Assess response to HEP; progress therex as indicated; use of modalities, manual therapy; and TPDN as indicated.   Aracelly Tencza MS, PT 06/10/23 2:43 PM

## 2023-06-14 ENCOUNTER — Ambulatory Visit (INDEPENDENT_AMBULATORY_CARE_PROVIDER_SITE_OTHER): Admitting: Sports Medicine

## 2023-06-14 ENCOUNTER — Encounter: Payer: Self-pay | Admitting: Sports Medicine

## 2023-06-14 DIAGNOSIS — M25852 Other specified joint disorders, left hip: Secondary | ICD-10-CM | POA: Diagnosis not present

## 2023-06-14 DIAGNOSIS — S73192A Other sprain of left hip, initial encounter: Secondary | ICD-10-CM

## 2023-06-14 DIAGNOSIS — G8929 Other chronic pain: Secondary | ICD-10-CM

## 2023-06-14 DIAGNOSIS — M25559 Pain in unspecified hip: Secondary | ICD-10-CM

## 2023-06-14 DIAGNOSIS — M25552 Pain in left hip: Secondary | ICD-10-CM

## 2023-06-14 MED ORDER — MELOXICAM 15 MG PO TABS: ORAL_TABLET | ORAL | 0 refills | Status: DC

## 2023-06-14 MED ORDER — MELOXICAM 15 MG PO TABS
ORAL_TABLET | ORAL | 0 refills | Status: DC
Start: 2023-06-14 — End: 2023-06-14

## 2023-06-14 NOTE — Progress Notes (Signed)
 Patient says that she has been in physical therapy for her shoulder and knee, but they have done some exercises to also accommodate for the left hip. She has been having pain mostly through the side and back of the hip, sometimes through the front, since January. She says that with her remaining PT sessions they are planning to focus on her hip more. She has also done one session of aquatic therapy.  She takes Tylenol  as needed which seems to lessen the intensity of her pain, but does not give her complete relief.

## 2023-06-14 NOTE — Progress Notes (Signed)
 Office Visit Note   Patient: Brittney [name]           Date of Birth: 16-Oct-1970           MRN: [id number] Visit Date: 06/14/2023              Requested by: Estil Heman, NP [address] [place],  [place] [address] PCP: Estil Heman, NP  Medical Resident, Sports Medicine Fellow - Attending Physician Addendum:   I have independently interviewed and examined the patient myself. I have discussed the above with the original author and agree with their documentation. My edits for correction/addition/clarification have been made, see any changes above and below.   In summary, patient is a 53 year-old female chronic left hip pain with more recent exacerbation.  We did review x-rays which do show x-ray findings of likely impingement syndrome, but her symptoms and physical exam also suggest possible degenerative labral tearing.  She has been doing both formalized physical therapy on land and aquatic-based therapy without significant relief for the hip, she has made improvements slowly with her shoulder.  Oral medications are also only helping minimally.  Given this, we will proceed with MRI arthrogram of the left hip to better evaluate and guide treatment management.  We will start her on a short course of meloxicam  15 mg to be taken for the next 2-3 weeks.  She will continue her therapy and home exercise regimen in the interim.  I will follow-up with her 1 week after MRI to review and discuss next steps.  Shauna Del, DO Primary Care Sports Medicine Physician  [facility] - Orthopedics  Assessment & Plan: Visit Diagnoses:  1. Chronic left hip pain   2. Tear of left acetabular labrum, initial encounter   3. Greater trochanteric pain syndrome   4. Femoroacetabular impingement of left hip    Plan: Discussed with patient that given her imaging as well as her physical exam, I do have concern for suspected labral tear.  Patient has been participating in physical therapy and has been doing  some hip strengthening exercises as well as continuing exercises at home.  Patient notes minimal improvement with these exercises.  Discussed with patient that at this time we will go ahead and do oral anti-inflammatory medications and plan to get an MRI of the left hip to evaluate for suspected labral tear.  Patient notes that she has been able to take meloxicam  in the past without any difficulty and that she does not have an allergy to NSAIDs. Will begin Meloxicam  15mg  every day for the next 2-3 weeks. Will plan on getting MRI and have patient follow-up in approximately 4 to 6 weeks.  At that time, if MRI is consistent with suspected pathology, we will go ahead and possibly do a intra-articular steroid injection vs. Additional structured PT.  Patient understanding and agreeable with plan.  Follow-Up Instructions: Return in about 1 week (around 06/21/2023) for 1-week after MRI hip.   Orders:  Orders Placed This Encounter  Procedures   MR Hip Left w/ contrast   DL FLUORO GUIDED NEEDLE PLC ASPIRATION / INJECTTION/LOC   Meds ordered this encounter  Medications   meloxicam  (MOBIC ) 15 MG tablet    Sig: Take 1 tablet daily with food for 7 days. Then take as needed.    Dispense:  30 tablet    Refill:  0    Procedures: No procedures performed   Clinical Data: No additional findings.  Subjective: Chief Complaint  Patient presents with   Left Hip - Pain    Patient presenting for left hip pain that is been going on since January.  Patient notes that the pain is exacerbated by going up and down stairs.  Patient states that sometimes she feels like her left hip gives out as well and that is when she has had to move her bedroom to the first floor.  Patient notes that the pain feels to be in the inguinal region but also laterally over the trochanteric area.  Patient notes that she has abdominal which she does report regularly but tries to walk on flat surfaces as going up and down hill and stairs  gives her significant amount of pain. Patient notes that she has been doing physical therapy for her shoulder and knee as well as her hip recently and has continued those exercises at home but notes minimal improvement of her hip    Review of Systems   Objective: Physical Exam  Ortho Exam Exam reveals no gross abnormalities of the left hip.  There is some mild tenderness to palpation over the greater trochanteric area as well as the inguinal region.  There is noted decreased range of motion with hip flexion as well as pain with resisted hip flexion.  Range of motion is full with internal and external rotation but pain is noted with both internal and external rotation of the hip.  FADIR/FABER positive.  There is also noted weakness of the left hip abductors compared to the right with pain  Imaging:  Narrative & Impression  CLINICAL DATA:  Bilateral hip pain for 4 months.  No known injury.   EXAM: DG HIP (WITH OR WITHOUT PELVIS) 2-3V RIGHT; DG HIP (WITH OR WITHOUT PELVIS) 2-3V LEFT   COMPARISON:  Left hip radiographs 05/17/2019, CT abdomen pelvis 05/08/2018   FINDINGS: The bilateral sacroiliac and femoroacetabular joint spaces are maintained. Mild superolateral left acetabular degenerative osteophytosis is similar to prior.   There is moderate to severe pubic symphysis joint space narrowing with subchondral cystic change and high-grade subchondral sclerosis.   IMPRESSION: 1. Mild left femoroacetabular osteoarthritis. 2. 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     Electronically Signed   By: Bertina Broccoli M.D.   On: 06/08/2023 18:23   *Independent review of hip unilateral x-ray and pelvis done on 4/23 2025.  X-rays show some mild widening of the pubic symphysis as well as some minimal sclerotic changes.  The right hip appears appropriate with mild narrowing of the femoral acetabular joint space.  No significant sclerosis or osteophytic  change.  Left hip joint does show similar narrowing as well as some mild sclerotic changes of the acetabular superior aspect.  There is a noted pincer lesion off the acetabulum (acetabular rim) superiorly, no noted cam lesion of the femoral head-neck junction. Findings suggest possible FAI.  PMFS History: Patient Active Problem List   Diagnosis Date Noted   Weakness of both legs 08/31/2021   Myalgia, multiple sites 08/31/2021   Back pain with radiculopathy 01/07/2021   Hyperlipidemia    OA (osteoarthritis) of knee    GERD (gastroesophageal reflux disease)    Rheumatoid arthritis (HCC)    Headache syndrome 12/12/2019   Bipolar 1 disorder, depressed, moderate (HCC) 12/14/2018   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   Fibromyalgia 02/03/2015   Tendinitis of both rotator cuffs 02/03/2015   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   Insomnia  secondary to chronic pain 11/13/2014   Fatigue due to sleep pattern disturbance 11/13/2014   Memory difficulties 06/21/2014   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   PTSD (post-traumatic stress disorder) 12/13/2013   Bipolar depression (HCC) 12/12/2013   PERICARDIAL EFFUSION 12/23/2008   PSEUDOTUMOR CEREBRI 12/05/2008   UNSPECIFIED PLEURAL EFFUSION 12/05/2008   DYSPNEA 12/05/2008   OBESITY 11/19/2008   Migraine 11/19/2008   ASTHMA 11/19/2008   OTHER SPECIFIED DISORDER OF STOMACH AND DUODENUM 03/11/2008   Past Medical History:  Diagnosis Date   Abscess    to eyelid   Anxiety    Arthritis    KNEES ELBOWS HIPS SHOULDER FINGERS   Asthma    Back pain    Bipolar disorder (HCC)    Depression    BIPOLAR   Dyspnea    Fibromyalgia    GERD (gastroesophageal reflux disease)    Headache syndrome 12/12/2019   Headache(784.0)    MIGRAINES   History of colonoscopy    History of degenerative disc disease    Hyperlipidemia    Major depressive disorder    Memory difficulties 06/21/2014   Neck pain    OA  (osteoarthritis) of knee    Obesity    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   Rheumatoid arthritis (HCC)    Seizures (HCC)    FACIAL SEIZURES LAST 4 DAYS AGO    Family History  Problem Relation Age of Onset   Cirrhosis Mother    Cancer - Lung Father    Diabetes Father    Neuropathy Father    Migraines Sister    Stroke Sister    Healthy Sister    Healthy Sister    Healthy Sister    Healthy Brother    Healthy Brother    Healthy Brother    Healthy Brother    Healthy Brother    Healthy Brother    Heart disease Maternal Grandmother    Alzheimer's disease Maternal Grandfather    Cancer - Lung Paternal Grandmother    Breast cancer Daughter 70   Neuropathy Daughter     Past Surgical History:  Procedure Laterality Date   CARDIAC CATHETERIZATION  2010   DILATION AND CURETTAGE OF UTERUS  1994   LAPAROSCOPY N/A 11/21/2012   Procedure: LAPAROSCOPY OPERATIVE REMOVAL OF RIGHT TUBE AND OVARY AND FLUID FROM MASS;  Surgeon: Romilda Coaster, MD;  Location: [facility];  Service: Gynecology;  Laterality: N/A;  1 1/2hrs OR time   OOPHORECTOMY     Social History   Occupational History   Not on file  Tobacco Use   Smoking status: Former    Current packs/day: 0.00    Types: Cigarettes    Quit date: 02/16/2003    Years since quitting: 20.3   Smokeless tobacco: Never  Vaping Use   Vaping status: Never Used  Substance and Sexual Activity   Alcohol  use: No    Alcohol /week: 0.0 standard drinks of alcohol     Comment: occasionally   Drug use: No   Sexual activity: Not Currently

## 2023-06-22 NOTE — Therapy (Deleted)
 OUTPATIENT PHYSICAL THERAPY SHOULDER TREATMENT/Lower Extremity TREATMENT    Patient Name: Brittney [name] MRN: [id number] DOB:03/18/1970, 53 y.o., female Today's Date: 06/22/2023  END OF SESSION:            Past Medical History:  Diagnosis Date   Abscess    to eyelid   Anxiety    Arthritis    KNEES ELBOWS HIPS SHOULDER FINGERS   Asthma    Back pain    Bipolar disorder (HCC)    Depression    BIPOLAR   Dyspnea    Fibromyalgia    GERD (gastroesophageal reflux disease)    Headache syndrome 12/12/2019   Headache(784.0)    MIGRAINES   History of colonoscopy    History of degenerative disc disease    Hyperlipidemia    Major depressive disorder    Memory difficulties 06/21/2014   Neck pain    OA (osteoarthritis) of knee    Obesity    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   Rheumatoid arthritis (HCC)    Seizures (HCC)    FACIAL SEIZURES LAST 4 DAYS AGO   Past Surgical History:  Procedure Laterality Date   CARDIAC CATHETERIZATION  2010   DILATION AND CURETTAGE OF UTERUS  1994   LAPAROSCOPY N/A 11/21/2012   Procedure: LAPAROSCOPY OPERATIVE REMOVAL OF RIGHT TUBE AND OVARY AND FLUID FROM MASS;  Surgeon: Romilda Coaster, MD;  Location: [facility];  Service: Gynecology;  Laterality: N/A;  1 1/2hrs OR time   OOPHORECTOMY     Patient Active Problem List   Diagnosis Date Noted   Weakness of both legs 08/31/2021   Myalgia, multiple sites 08/31/2021   Back pain with radiculopathy 01/07/2021   Hyperlipidemia    OA (osteoarthritis) of knee    GERD (gastroesophageal reflux disease)    Rheumatoid arthritis (HCC)    Headache syndrome 12/12/2019   Bipolar 1 disorder, depressed, moderate (HCC) 12/14/2018   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   Fibromyalgia 02/03/2015   Tendinitis of both rotator cuffs 02/03/2015   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   Insomnia secondary to chronic pain 11/13/2014   Fatigue due to sleep pattern  disturbance 11/13/2014   Memory difficulties 06/21/2014   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   PTSD (post-traumatic stress disorder) 12/13/2013   Bipolar depression (HCC) 12/12/2013   PERICARDIAL EFFUSION 12/23/2008   PSEUDOTUMOR CEREBRI 12/05/2008   UNSPECIFIED PLEURAL EFFUSION 12/05/2008   DYSPNEA 12/05/2008   OBESITY 11/19/2008   Migraine 11/19/2008   ASTHMA 11/19/2008   OTHER SPECIFIED DISORDER OF STOMACH AND DUODENUM 03/11/2008    PCP: Estil Heman, NP   REFERRING PROVIDER: Ngetich, Elijio Guadel[place], NP   REFERRING DIAG:  [phone] (ICD-10-CM) - Chronic left shoulder pain  M17.0 (ICD-10-CM) - Primary osteoarthritis of both knees  M05.751,M05.752 (ICD-10-CM) - 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  Rationale for Evaluation and Treatment: Rehabilitation  ONSET DATE: Chronic  SUBJECTIVE:  SUBJECTIVE STATEMENT:  06-10-23  Pt reports being discouraged re: the xray of her L hip revealing arthritic changes.   EVal -Pt requested her L shoulder pain be addressed first. Pt reports her L shoulder has been hurting since January when she started exercising more often with both aquatics and a trainer. Currently, she is not working with the trainer and she finds water exercises help to decrease the L shoulder pain. In 2019, she states she had a labral tear. Pt notes she is to receive a cortisone injection for the L shoulder on Friday. Pt received cortisone injections of both knees yesterday and they feel much better. Overall, she reports her physical current issues are debilitating.  Hand dominance: Right  PERTINENT HISTORY: Obesity, fibromyalgia, headache syndrome, DDD, neck pain  PAIN:  Are you having pain? Yes: NPRS  scale: 0/10. Pain range the week prior to start of PT:7-9/10 Pain location: Anterior L shoulder Pain description: burning, sharp Aggravating factors: Use of it Relieving factors: Water therapy  Knees: 0/10  L hip: 6/10  PRECAUTIONS: None  RED FLAGS: None   WEIGHT BEARING RESTRICTIONS: No  FALLS:  Has patient fallen in last 6 months? Yes. Number of falls 1 Sharp pain with feet and dropped  LIVING ENVIRONMENT: Lives with: lives with their family Lives in: House/apartment Able to access home  OCCUPATION: Geophysicist/field seismologist  PLOF: Independent  PATIENT GOALS:Pain relief, improved function  NEXT MD VISIT:   OBJECTIVE:  Note: Objective measures were completed at Evaluation unless otherwise noted.  DIAGNOSTIC FINDINGS:  Xray R shoulder 07/16/22 IMPRESSION: Mild to moderate glenohumeral joint degeneration. No acute osseous abnormality identified about the left shoulder.  Xray R knee 04/26/23 X-rays demonstrate moderate medial and patellofemoral degenerative changes  Xray L knee 04/26/23 X-rays demonstrate moderate medial and patellofemoral degenerative changes   L hip DG 06/08/23 FINDINGS: The bilateral sacroiliac and femoroacetabular joint spaces are maintained. Mild superolateral left acetabular degenerative osteophytosis is similar to prior.   There is moderate to severe pubic symphysis joint space narrowing with subchondral cystic change and high-grade subchondral sclerosis.   IMPRESSION: 1. Mild left femoroacetabular osteoarthritis. 2. 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  PATIENT SURVEYS:  Quick Dash 47/55=89% disability  COGNITION: Overall cognitive status: Within functional limits for tasks assessed     SENSATION: WFL  POSTURE: Forward head and rounded shoulder  UPPER EXTREMITY ROM:   All L shoulder AROMs were painful with appropriate ROM. Pt completes at a slow pace Active ROM Right eval  Left eval Left 05/02/23 LT 05/11/23 Lt 05/23/23  Shoulder flexion  130 140 165 165  Shoulder extension       Shoulder abduction  130 160  165 painful arc  Shoulder adduction       Shoulder internal rotation  T9     Shoulder external rotation  T4     Elbow flexion       Elbow extension       Wrist flexion       Wrist extension       Wrist ulnar deviation       Wrist radial deviation       Wrist pronation       Wrist supination       (Blank rows = not tested)  UPPER EXTREMITY MMT:  L shoulder strength was 4 to 4+/5 in a neutral position without significant increase in pain MMT Right eval Left eval  Shoulder flexion    Shoulder extension  Shoulder abduction    Shoulder adduction    Shoulder internal rotation    Shoulder external rotation    Middle trapezius    Lower trapezius    Elbow flexion    Elbow extension    Wrist flexion    Wrist extension    Wrist ulnar deviation    Wrist radial deviation    Wrist pronation    Wrist supination    Grip strength (lbs)    (Blank rows = not tested)  SHOULDER SPECIAL TESTS: Impingement tests: Hawkins/Kennedy impingement test: negative SLAP lesions: Crank test: negative and Biceps load test: negative Rotator cuff assessment: Empty can test: negative and Full can test: negative Biceps assessment: Speed's test: negative  JOINT MOBILITY TESTING:  Stable GH jt  PALPATION:  TTP to the L upper trap  TTP to the L lateral greater trochanter LE Assessement LE ROM:  WNLS Active ROM Right Eval Left Eval  Hip flexion    Hip extension    Hip abduction    Hip adduction    Hip internal rotation    Hip external rotation    Knee flexion    Knee extension    Ankle dorsiflexion    Ankle plantarflexion    Ankle inversion    Ankle eversion     (Blank rows = not tested)  LE MMT:  MMT Right Eval Left Eval  Hip flexion 4+ 4  Hip extension 4 3  Hip abduction 4+ 4  Hip adduction    Hip internal rotation    Hip external  rotation 4+ 4  Knee flexion 5 5  Knee extension 5 5  Ankle dorsiflexion    Ankle plantarflexion    Ankle inversion    Ankle eversion     (Blank rows = not tested)  LOWER EXTREMITY SPECIAL TESTS:  Hip special tests: Portia Brittle (FABER) test: positive   FUNCTIONAL TESTS:  5 times sit to stand: TBA 2 minute walk test: TBA 05/09/23:  5 x STS with UE support : 15.3 sec  05/09/23: 474 feet   GAIT: Distance walked: 200' Assistive device utilized: None Level of assistance: Complete Independence Comments: WNLs                                                                                                                            TREATMENT DATE: [facility] Adult PT Treatment:                                                DATE: 06/23/23 Manual Therapy: L hip LAD Therapeutic Exercise: Piriformis and figure 4 stretches x2 30" each Banded Bridge x15 3" GTB SL clams each x15 GTB SLR c AB engage x15, pilates ring Hip add sets c ball squeeze x15 Self Care: Discussed exercise options and the importance of continuing to ex at an appropriate  level for hip health and managing pain Instruction on self LAD for the L hip at home Therapeutic Exercise: *** Manual Therapy: *** Neuromuscular re-ed: *** Therapeutic Activity: *** Modalities: *** Self Care: ***  [facility] Adult PT Treatment:                                                DATE: 06/10/23 Manual Therapy: L hip LAD Therapeutic Exercise: Piriformis and figure 4 stretches x2 30" each Banded Bridge x15 3" GTB SL clams each x15 GTB SLR c AB engage x15, pilates ring Hip add sets c ball squeeze x15 Self Care: Discussed exercise options and the importance of continuing to ex at an appropriate level for hip health and managing pain Instruction on self LAD for the L hip at home  [facility] Adult PT Treatment:                                                DATE: 06-08-23 Aquatic therapy at [facility]- [address] - therapeutic pool temp 92  degrees Pt enters building independently.  Treatment took place in water 3.8 to  4 ft 8 in.feet deep depending upon activity.  Pt entered and exited the pool via stair and handrails   Initiation of RX  Left shoulder 3/10 and Left hip 5/10 Aquatic Therapy:  Water walking for warm up fwd, sidestepping Access Code: [email or web address] HEP provided and reviewed  with laminated sheets  Hip Flexion Extension   Hip Abduction  Forward and Backward Walking Lunge  Standing March at [facility] and adding pool stomp with yellow noodle Side Stepping  with aquatic cuffs bil and using yellow aquatic DB Standing 3-Way Kick with Ankle Float at [employer] Hip Circles at [employer] Hip Internal and External Rotation   Hip Circles with Arm Swings   Squat and Arm Circles with Hand Floats  - 1 x daily - 7 x weekly - 3 sets - 10 reps Figure 8 Arms with Partial Squat   Squat   Seated Piriformis Stretch with Trunk Bend   Abdominal Curls Center with Upper Extremity Flotation  Abdominal Curls Diagonal with Upper Extremity Flotations  Suspended [employer] Ski with Foam Dumbbells and Ankle Floats  Pt guided in pool by PT for proper execution of each exercise  Pt requires the buoyancy of water for active assisted exercises with buoyancy supported for strengthening and AROM exercises. Hydrostatic pressure also supports joints by unweighting joint load by at least 50 % in 3-4 feet depth water. 80% in chest to neck deep water. Water will provide assistance with movement using the current and laminar flow while the buoyancy reduces weight bearing. Pt requires the viscosity of the water for resistance with strengthening exercises.  PATIENT EDUCATION: Education details: Eval findings, POC, HEP Person educated: Patient Education method: Explanation, Demonstration, Tactile cues, and Verbal cues Education comprehension: verbalized understanding, returned demonstration, verbal cues required, and tactile cues  required  HOME EXERCISE PROGRAM: Access Code: [email or web address] URL: [email or web address] Date: 05/04/2023 Prepared by: Liborio Reeds  Exercises - Flexion-Extension Shoulder Pendulum with Table Support  - 3 x daily - 7 x weekly - 1 sets - 20 reps - Seated Piriformis Stretch  -  1 x daily - 7 x weekly - 1 sets - 3 reps - 30 hold - Seated Piriformis Stretch with Trunk Bend  - 1 x daily - 7 x weekly - 1 sets - 3 reps - 30 hold - Supine Piriformis Stretch with Foot on Ground  - 1 x daily - 7 x weekly - 1 sets - 3 reps - 30 hold - Supine Figure 4 Piriformis Stretch  - 1 x daily - 7 x weekly - 1 sets - 3 reps - 30 hold - Active Straight Leg Raise with Quad Set  - 1 x daily - 7 x weekly - 2 sets - 10 reps - 3 hold - Supine Bridge with Resistance Band  - 1 x daily - 7 x weekly - 2 sets - 10 reps - 2 hold - Hooklying Clamshell with Resistance  - 1 x daily - 7 x weekly - 2 sets - 10 reps - 2 hold - Supine March with Resistance Band  - 1 x daily - 7 x weekly - 2 sets - 10 reps - 2 hold Added 3/24 - Standing Bilateral Low Shoulder Row with Anchored Resistance  - 1 x daily - 7 x weekly - 2 sets - 10 reps - Shoulder extension with resistance - Neutral  - 1 x daily - 7 x weekly - 2 sets - 10 reps - Shoulder Internal Rotation with Resistance  - 1 x daily - 7 x weekly - 2 sets - 10 reps - Shoulder External Rotation with Anchored Resistance  - 1 x daily - 7 x weekly - 2 sets - 10 reps  Aquatic program Access Code: [email or web address] URL: [email or web address] Date: 06/02/2023 Prepared by: Sharlet Dawson  Exercises - Standing Hip Flexion Extension  - 1 x daily - 7 x weekly - 3 sets - 10 reps - Standing Hip Abduction  - 1 x daily - 7 x weekly - 3 sets - 10 reps - Forward and Backward Walking Lunge in Shallow Water  - 1 x daily - 1-3 x weekly - 2 sets - 10 reps - Standing March at [facility]  - 1 x daily - 7 x weekly - 3 sets - 10 reps - Side Stepping  - 1 x daily - 7 x weekly - 3 sets - 10  reps - Standing 3-Way Kick with Ankle Float at [employer]  - 1 x daily - 7 x weekly - 3 sets - 10 reps - Standing Hip Circles at [employer]  - 1 x daily - 7 x weekly - 3 sets - 10 reps - Standing Hip Internal and External Rotation  - 1 x daily - 7 x weekly - 3 sets - 10 reps - Hip Circles with Arm Swings  - 1 x daily - 7 x weekly - 3 sets - 10 reps - Squat and Arm Circles with Hand Floats  - 1 x daily - 7 x weekly - 3 sets - 10 reps - Figure 8 Arms with Partial Squat  - 1 x daily - 7 x weekly - 3 sets - 10 reps - Squat  - 1 x daily - 7 x weekly - 3 sets - 10 reps - Seated Piriformis Stretch with Trunk Bend  - 1 x daily - 7 x weekly - 3 sets - 10 reps - Abdominal Curls Center with Upper Extremity Flotation  - 1 x daily - 7 x weekly - 3 sets - 10 reps - Abdominal Curls Diagonal with  Upper Extremity Flotation  - 1 x daily - 7 x weekly - 3 sets - 10 reps - Suspended [employer] Ski with Foam Dumbbells and Ankle Floats  - 1 x daily - 7 x weekly - 3 sets - 10 reps ASSESSMENT:  CLINICAL IMPRESSION: 06-10-23 Provided education re: the importance and benefits of continuing exercise with arthritis of the hip. LAD was then completed with pt reporting relief with technique. Pt was instructed in how to complete at home, and voiced understanding. Exercises were then completed for pelvic flexibility and strength. Pt tolerated PT today without adverse effects. Pt will continue to benefit from skilled PT to address impairments for improved L hip function with minimized.     Re-eval: Completed assessment for bilat legs. Knee pain appears OA related while the L hip seems most positive for tendinopathy. Therex were completed for hip mobility and for LE strengthening. A HEP was developed with a written program provided to the pt. Pt tolerated PT today without adverse effects. Pt will continue to benefit from skilled PT to address impairments for improved function with minimized pain. Will assess 5xSTS and the next  PT session.  EVAL: Patient is a 53 y.o. 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   OBJECTIVE IMPAIRMENTS: decreased activity tolerance, decreased balance, difficulty walking, decreased strength, postural dysfunction, obesity, and pain.   ACTIVITY LIMITATIONS: carrying, lifting, bending, sitting, standing, squatting, sleeping, stairs, transfers, bathing, toileting, dressing, reach over head, locomotion level, and caring for others  PARTICIPATION LIMITATIONS: meal prep, cleaning, laundry, driving, shopping, community activity, and occupation  PERSONAL FACTORS: Fitness, Past/current experiences, Time since onset of injury/illness/exacerbation, and 3+ comorbidities:    Obesity, fibromyalgia, headache syndrome, DDD, neck pain are also affecting patient's functional outcome.   REHAB POTENTIAL: Good  CLINICAL DECISION MAKING: Evolving/moderate complexity  EVALUATION COMPLEXITY: Moderate   GOALS:  SHORT TERM GOALS: Target date: 05/12/23  Pt will be Ind in an initial HEP  Baseline:started Goal status: MET  2.  Pt will voice understanding of measures to assist in pain reduction  Baseline:  Goal status: MET   LONG TERM GOALS: Target date: 07/01/23  Pt will be Ind in a final HEP to maintain achieved LOF  Baseline: started Goal status: INITIAL  2.  Pt will report 50% or greater improvement for her L shoulder for improved function and QOL. Baseline: 7-9/10 Goal status: INITIAL  3.  Pt will demonstrate the ability to lift 3# OH with good quality of movement for functional use with ADLs  and home activites Baseline: Unable Goal status: INITIAL  4.  Pt's Quick Dash score will improve by the MCID to 73% as indication of improved  L shoulder function  Baseline: 89% Goal status: INITIAL  5. Improve 5xSTS by MCID of 5" and by MCID of 50ft as indication of improved functional mobility   Baseline: TBA  Goal Status: Initial  PLAN:  PT FREQUENCY: 2x/week  PT DURATION: 8 weeks  PLANNED INTERVENTIONS: 97164- PT Re-evaluation, 97110-Therapeutic exercises, 97530- Therapeutic activity, [id number]- Neuromuscular re-education, 97535- Self Care, 16109- Manual therapy, [id number]- Gait training, [phone]- Electrical stimulation (unattended), [phone]- Ionotophoresis 4mg /ml Dexamethasone , Balance training, Stair training, Taping, Dry Needling, Joint mobilization, Spinal mobilization, Cryotherapy, and Moist heat  PLAN FOR NEXT SESSION: Assess response to  HEP; progress therex as indicated; use of modalities, manual therapy; and TPDN as indicated.   Harlis Champoux MS, PT 06/22/23 3:33 PM

## 2023-06-23 ENCOUNTER — Ambulatory Visit

## 2023-06-24 ENCOUNTER — Ambulatory Visit

## 2023-06-29 ENCOUNTER — Ambulatory Visit

## 2023-07-01 ENCOUNTER — Ambulatory Visit

## 2023-07-05 ENCOUNTER — Other Ambulatory Visit

## 2023-07-05 ENCOUNTER — Inpatient Hospital Stay: Admission: RE | Admit: 2023-07-05 | Source: Ambulatory Visit

## 2023-07-06 ENCOUNTER — Ambulatory Visit: Admitting: Physical Therapy

## 2023-07-08 ENCOUNTER — Ambulatory Visit: Admitting: Physical Therapy

## 2023-07-12 ENCOUNTER — Other Ambulatory Visit: Payer: Self-pay | Admitting: Family Medicine

## 2023-07-12 DIAGNOSIS — S73192A Other sprain of left hip, initial encounter: Secondary | ICD-10-CM

## 2023-07-20 ENCOUNTER — Other Ambulatory Visit (HOSPITAL_COMMUNITY)

## 2023-07-21 ENCOUNTER — Ambulatory Visit (HOSPITAL_COMMUNITY): Admission: RE | Admit: 2023-07-21 | Source: Ambulatory Visit

## 2023-07-21 ENCOUNTER — Inpatient Hospital Stay (HOSPITAL_COMMUNITY)
Admission: RE | Admit: 2023-07-21 | Discharge: 2023-07-21 | Disposition: A | Discharge status: Home / Self Care | Source: Ambulatory Visit | Attending: Sports Medicine | Admitting: Sports Medicine

## 2023-07-25 ENCOUNTER — Ambulatory Visit
Admission: EM | Admit: 2023-07-25 | Discharge: 2023-07-25 | Disposition: A | Discharge status: Other Acute Inpatient Hospital | Attending: Family Medicine | Admitting: Family Medicine

## 2023-07-25 ENCOUNTER — Emergency Department (HOSPITAL_BASED_OUTPATIENT_CLINIC_OR_DEPARTMENT_OTHER)

## 2023-07-25 ENCOUNTER — Emergency Department (HOSPITAL_BASED_OUTPATIENT_CLINIC_OR_DEPARTMENT_OTHER)
Admission: EM | Admit: 2023-07-25 | Discharge: 2023-07-25 | Disposition: A | Discharge status: Home / Self Care | Attending: Emergency Medicine | Admitting: Emergency Medicine

## 2023-07-25 ENCOUNTER — Encounter (HOSPITAL_BASED_OUTPATIENT_CLINIC_OR_DEPARTMENT_OTHER): Payer: Self-pay

## 2023-07-25 ENCOUNTER — Other Ambulatory Visit: Payer: Self-pay

## 2023-07-25 DIAGNOSIS — M5441 Lumbago with sciatica, right side: Secondary | ICD-10-CM | POA: Diagnosis not present

## 2023-07-25 DIAGNOSIS — M545 Low back pain, unspecified: Secondary | ICD-10-CM | POA: Insufficient documentation

## 2023-07-25 DIAGNOSIS — M25552 Pain in left hip: Secondary | ICD-10-CM | POA: Insufficient documentation

## 2023-07-25 LAB — CBC
HCT: 38.9 % (ref 36.0–46.0)
Hemoglobin: 12.5 g/dL (ref 12.0–15.0)
MCH: 27.7 pg (ref 26.0–34.0)
MCHC: 32.1 g/dL (ref 30.0–36.0)
MCV: 86.1 fL (ref 80.0–100.0)
Platelets: 308 10*3/uL (ref 150–400)
RBC: 4.52 MIL/uL (ref 3.87–5.11)
RDW: 13 % (ref 11.5–15.5)
WBC: 5.9 10*3/uL (ref 4.0–10.5)
nRBC: 0 % (ref 0.0–0.2)

## 2023-07-25 LAB — BASIC METABOLIC PANEL WITH GFR
Anion gap: 12 (ref 5–15)
BUN: 13 mg/dL (ref 6–20)
CO2: 27 mmol/L (ref 22–32)
Calcium: 10.2 mg/dL (ref 8.9–10.3)
Chloride: 101 mmol/L (ref 98–111)
Creatinine, Ser: 0.94 mg/dL (ref 0.44–1.00)
GFR, Estimated: >60 mL/min (ref >60)
Glucose, Bld: 110 mg/dL — ABNORMAL HIGH (ref 70–99)
Potassium: 4.1 mmol/L (ref 3.5–5.1)
Sodium: 140 mmol/L (ref 135–145)

## 2023-07-25 MED ORDER — METHYLPREDNISOLONE SODIUM SUCC 125 MG IJ SOLR
125.0000 mg | Freq: Once | INTRAMUSCULAR | Status: AC
  Administered 2023-07-25: 125 mg via INTRAVENOUS
  Filled 2023-07-25: qty 2

## 2023-07-25 MED ORDER — CYCLOBENZAPRINE HCL 10 MG PO TABS: 10.0000 mg | ORAL_TABLET | Freq: Two times a day (BID) | ORAL | 0 refills | Status: DC | PRN

## 2023-07-25 MED ORDER — KETOROLAC TROMETHAMINE 30 MG/ML IJ SOLN
30.0000 mg | Freq: Once | INTRAMUSCULAR | Status: AC
  Administered 2023-07-25: 30 mg via INTRAMUSCULAR

## 2023-07-25 MED ORDER — FENTANYL CITRATE PF 50 MCG/ML IJ SOSY
25.0000 ug | PREFILLED_SYRINGE | Freq: Once | INTRAMUSCULAR | Status: AC
  Administered 2023-07-25: 25 ug via INTRAVENOUS
  Filled 2023-07-25: qty 1

## 2023-07-25 MED ORDER — LIDOCAINE 5 % EX PTCH
2.0000 | MEDICATED_PATCH | CUTANEOUS | Status: DC
  Administered 2023-07-25: 2 via TRANSDERMAL
  Filled 2023-07-25: qty 2

## 2023-07-25 MED ORDER — OXYCODONE-ACETAMINOPHEN 5-325 MG PO TABS
1.0000 | ORAL_TABLET | Freq: Once | ORAL | Status: AC
  Administered 2023-07-25: 1 via ORAL
  Filled 2023-07-25: qty 1

## 2023-07-25 MED ORDER — PREDNISONE 10 MG (21) PO TBPK: ORAL_TABLET | Freq: Every day | ORAL | 0 refills | Status: DC

## 2023-07-25 MED ORDER — OXYCODONE HCL 5 MG PO TABS: 5.0000 mg | ORAL_TABLET | Freq: Four times a day (QID) | ORAL | 0 refills | Status: DC | PRN

## 2023-07-25 NOTE — ED Provider Notes (Signed)
 [facility]    CSN: [id number] Arrival date & time: 07/25/23  1119      History   Chief Complaint Chief Complaint  Patient presents with   Back Pain    HPI Brittney [name] is a 53 y.o. female presents for back pain.  Patient reports a history of bipolar, depression, fibromyalgia, GERD, arthritis, osteoarthritis, rheumatoid arthritis who presents with low back pain.  Patient reports 3 days ago she developed a intermittent right lower back pain that was radiating to her neck.  It is now persistent and worsening.  She does endorse numbness/tingling and weakness in the leg and endorses difficulty having a bowel movement since onset of pain.  Denies any saddle paresthesia.  No known injury or inciting event.  She thinks.  Worse when she did an inversion table recently to help "push through her pain".  She does report a history of low back pain but no surgeries.  States she has been having MRI of her left hip canceled that due to other circumstances.  She took 2 g of Tylenol  as well as Flexeril  today without any improvement.  She is not on any steroids for her arthritis.  She previously had been taking Mobic  but has not taken it in over a week as it was causing upset stomach.  Patient also was unable to walk into the exam room due to the pain and had to use a wheelchair.   Back Pain   Past Medical History:  Diagnosis Date   Abscess    to eyelid   Anxiety    Arthritis    KNEES ELBOWS HIPS SHOULDER FINGERS   Asthma    Back pain    Bipolar disorder (HCC)    Depression    BIPOLAR   Dyspnea    Fibromyalgia    GERD (gastroesophageal reflux disease)    Headache syndrome 12/12/2019   Headache(784.0)    MIGRAINES   History of colonoscopy    History of degenerative disc disease    Hyperlipidemia    Major depressive disorder    Memory difficulties 06/21/2014   Neck pain    OA (osteoarthritis) of knee    Obesity    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   Rheumatoid arthritis  (HCC)    Seizures (HCC)    FACIAL SEIZURES LAST 4 DAYS AGO    Patient Active Problem List   Diagnosis Date Noted   Weakness of both legs 08/31/2021   Myalgia, multiple sites 08/31/2021   Back pain with radiculopathy 01/07/2021   Hyperlipidemia    OA (osteoarthritis) of knee    GERD (gastroesophageal reflux disease)    Rheumatoid arthritis (HCC)    Headache syndrome 12/12/2019   Bipolar 1 disorder, depressed, moderate (HCC) 12/14/2018   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   Fibromyalgia 02/03/2015   Tendinitis of both rotator cuffs 02/03/2015   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   Insomnia secondary to chronic pain 11/13/2014   Fatigue due to sleep pattern disturbance 11/13/2014   Memory difficulties 06/21/2014   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   PTSD (post-traumatic stress disorder) 12/13/2013   Bipolar depression (HCC) 12/12/2013   PERICARDIAL EFFUSION 12/23/2008   PSEUDOTUMOR CEREBRI 12/05/2008   UNSPECIFIED PLEURAL EFFUSION 12/05/2008   DYSPNEA 12/05/2008   OBESITY 11/19/2008   Migraine 11/19/2008   ASTHMA 11/19/2008   OTHER SPECIFIED DISORDER OF STOMACH AND DUODENUM 03/11/2008    Past Surgical History:  Procedure Laterality Date   CARDIAC CATHETERIZATION  2010   DILATION AND CURETTAGE OF UTERUS  1994   LAPAROSCOPY N/A 11/21/2012   Procedure: LAPAROSCOPY OPERATIVE REMOVAL OF RIGHT TUBE AND OVARY AND FLUID FROM MASS;  Surgeon: Romilda Coaster, MD;  Location: [facility];  Service: Gynecology;  Laterality: N/A;  1 1/2hrs OR time   OOPHORECTOMY      OB History   No obstetric history on file.      Home Medications    Prior to Admission medications   Medication Sig Start Date End Date Taking? Authorizing Provider  magnesium  oxide (MAG-OX) 400 (240 Mg) MG tablet Take 400 mg by mouth daily.    [provider]  MAGNESIUM  PO Take by mouth.    [provider]  meloxicam  (MOBIC ) 15 MG tablet Take 1  tablet daily with food for 10-14 days. Then take as needed. 06/14/23   Brooks, Dana, DO  traMADol  (ULTRAM ) 50 MG tablet Take 1 tablet (50 mg total) by mouth every 12 (twelve) hours as needed. 04/27/23   Sandie Cross, PA-C    Family History Family History  Problem Relation Age of Onset   Cirrhosis Mother    Cancer - Lung Father    Diabetes Father    Neuropathy Father    Migraines Sister    Stroke Sister    Healthy Sister    Healthy Sister    Healthy Sister    Healthy Brother    Healthy Brother    Healthy Brother    Healthy Brother    Healthy Brother    Healthy Brother    Heart disease Maternal Grandmother    Alzheimer's disease Maternal Grandfather    Cancer - Lung Paternal Grandmother    Breast cancer Daughter 75   Neuropathy Daughter     Social History Social History   Tobacco Use   Smoking status: Former    Current packs/day: 0.00    Types: Cigarettes    Quit date: 02/16/2003    Years since quitting: 20.4   Smokeless tobacco: Never  Vaping Use   Vaping status: Never Used  Substance Use Topics   Alcohol  use: No    Alcohol /week: 0.0 standard drinks of alcohol     Comment: occasionally   Drug use: No     Allergies   Carbamazepine    Review of Systems Review of Systems  Musculoskeletal:  Positive for back pain.     Physical Exam Triage Vital Signs ED Triage Vitals  Encounter Vitals Group     BP 07/25/23 1136 119/81     Systolic BP Percentile --      Diastolic BP Percentile --      Pulse Rate 07/25/23 1136 (!) 58     Resp 07/25/23 1136 (!) 22     Temp 07/25/23 1136 (!) 97.5 F (36.4 C)     Temp Source 07/25/23 1136 Oral     SpO2 07/25/23 1136 97 %     Weight --      Height --      Head Circumference --      Peak Flow --      Pain Score 07/25/23 1133 10     Pain Loc --      Pain Education --      Exclude from Growth Chart --    No data found.  Updated Vital Signs BP 119/81   Pulse (!) 58   Temp (!) 97.5 F (36.4 C) (Oral)   Resp (!) 22    SpO2 97%  Visual Acuity Right Eye Distance:   Left Eye Distance:   Bilateral Distance:    Right Eye Near:   Left Eye Near:    Bilateral Near:     Physical Exam Vitals and nursing note reviewed.  Constitutional:      Appearance: Normal appearance. She is not ill-appearing or diaphoretic.     Comments: Patient is uncomfortable  HENT:     Head: Normocephalic and atraumatic.  Eyes:     Pupils: Pupils are equal, round, and reactive to light.  Cardiovascular:     Rate and Rhythm: Bradycardia present.  Pulmonary:     Effort: Pulmonary effort is normal.  Musculoskeletal:     Lumbar back: Spasms, tenderness and bony tenderness present. No swelling, edema, deformity, signs of trauma or lacerations. Decreased range of motion. No scoliosis.       Back:     Comments: Unable to perform straight leg raise as patient could not stand to lay flat on exam table.  Strength is 5 out of 5 left lower extremity and 4 out of 5 right.  Skin:    General: Skin is warm and dry.  Neurological:     General: No focal deficit present.     Mental Status: She is alert and oriented to person, place, and time.     Deep Tendon Reflexes:     Reflex Scores:      Patellar reflexes are 1+ on the right side and 2+ on the left side. Psychiatric:        Mood and Affect: Mood normal.      UC Treatments / Results  Labs (all labs ordered are listed, but only abnormal results are displayed) Labs Reviewed - No data to display  Comprehensive metabolic panel Order: [id number]  Status: Final result     Next appt: 07/26/2023 at 09:30 AM in Urgent Care ([facility])   Test Result Released: Yes (seen)   3 Result Notes           Component Ref Range & Units (hover) 11 mo ago (08/23/22) 1 yr ago (02/28/22) 1 yr ago (10/22/21) 3 yr ago (01/31/20) 3 yr ago (01/31/20) 3 yr ago (01/30/20) 4 yr ago (12/14/18) 4 yr ago (12/14/18)  Sodium 141 137 140  141 139  138  Potassium 4.8 4.2 4.5  3.9 3.7  3.6   Chloride 106 104 105  104 105  104  CO2 26 25 24  25 27  26   Glucose, Bld 81 100 High  CM 84 CM  91 CM 101 High  CM  92  Comment: Glucose reference range applies only to samples taken after fasting for at least 8 hours.  BUN 6 8 13  11 7  8   Creatinine, Ser 1.04 High  0.73 0.88  0.96 0.80  0.94  Calcium 9.5 9.4 9.6  9.9 9.9  9.6  Total Protein 7.3 7.4  8.0   7.7 7.6  Albumin 4.0 3.9  4.5   4.1 4.1  AST 31 31  23   19 19   ALT 16 22  20   17 16   Alkaline Phosphatase 74 81  91   94 96  Total Bilirubin 0.7 0.5  0.7   0.8 0.6  GFR, Estimated >60 >60 CM >60 CM  >60 CM >60 CM    Comment: (NOTE) Calculated using the CKD-EPI Creatinine Equation (2021)  Anion gap 9 8 CM 11 CM  12 CM 7 CM  8 CM  Comment:  Performed at [facility], [address], [place], [place] [address]  Resulting Agency [facility]        Specimen Collected: 08/23/22 11:59 Last Resulted: 08/23/22 17:10    EKG   Radiology No results found.  Procedures Procedures (including critical care time)  Medications Ordered in UC Medications  ketorolac  (TORADOL ) 30 MG/ML injection 30 mg (30 mg Intramuscular Given 07/25/23 1203)    Initial Impression / Assessment and Plan / UC Course  I have reviewed the triage vital signs and the nursing notes.  Pertinent labs & imaging results that were available during my care of the patient were reviewed by me and considered in my medical decision making (see chart for details).     I reviewed exam and symptoms with patient.  Patient presenting with acute right low back pain that is worsening.  Given her presentation and exam I do have a concern for cauda equina syndrome.  She does have diminished strength and reflexes on the right.  Also endorses difficulty with a bowel movement over the past 3 days.  Discussed findings with patient.  Advised that she should go to the emergency room for further  treatment and evaluation.  Did give patient Toradol  injection in clinic to help manage her pain so that she can get to the ER.  She was observed for 10 minutes after injection with no reaction noted and tolerated well.  She was instructed no NSAIDs for 24 hours.  Patient taken by her daughter via POV to the emergency room.  She was instructed to pull over and call 911 for any worsening symptoms or concerns and she verbalized understand Final Clinical Impressions(s) / UC Diagnoses   Final diagnoses:  Acute right-sided low back pain with right-sided sciatica     Discharge Instructions      Please go to the emergency room for further evaluation of your back pain  You were given a Toradol  injection in clinic today. Do not take any over the counter NSAID's such as Advil , ibuprofen , Aleve, or naproxen for 24 hours.  You may take tylenol  if needed    ED Prescriptions   None    PDMP not reviewed this encounter.   Alleen Arbour, NP 07/25/23 1236

## 2023-07-25 NOTE — Discharge Instructions (Signed)
 As discussed, CT scan of your back showed multiple bulging disks in your back which could be causing your problems.  I do not think that you have cauda equina as the urgent care was concerned about.  Will send you home with a few different medications to treat your pain.  Muscle laxer can cause drowsiness so please do not drive or perform any high-risk activity tubulous its effects on you.  Recommend follow-up with a spinal specialist in the outpatient setting.  Will also send a referral for physical therapy to be seen.  Please do not hesitate to return to either [facility] or [facility] if you develop any worsening symptoms as we discussed as there is MRI present there for further assessment regarding your back pain.

## 2023-07-25 NOTE — ED Notes (Signed)
 Patient is being discharged from the Urgent Care and sent to the Emergency Department via private vehicle. Per Ramonita Burow, NP, patient is in need of higher level of care due to intense back pain that radiates down legs. Patient is aware and verbalizes understanding of plan of care.  Vitals:   07/25/23 1136  BP: 119/81  Pulse: (!) 58  Resp: (!) 22  Temp: (!) 97.5 F (36.4 C)  SpO2: 97%

## 2023-07-25 NOTE — Discharge Instructions (Addendum)
 Please go to the emergency room for further evaluation of your back pain  You were given a Toradol  injection in clinic today. Do not take any over the counter NSAID's such as Advil , ibuprofen , Aleve, or naproxen for 24 hours.  You may take tylenol  if needed

## 2023-07-25 NOTE — ED Notes (Signed)
 Pt given discharge instructions and reviewed prescriptions. Opportunities given for questions. Pt verbalizes understanding. PIV removed x1. Jillyn Hidden, RN

## 2023-07-25 NOTE — ED Triage Notes (Addendum)
 Pt c/o pain in entire lower backx3d. Pt states the pain is worse in her right lower back today. Pt states the pain radiates down both legs, but worse going down right leg. Pt c/o numbness and tingling in lower legs into feet bilat. Pt denies loss of bowel or bladder. Pt denies injury. Pt had to be brought to exam room in wheelchair. Pt states took flexeril  2 hours ago and tylenol  2g at 0630, but neither medicine helped the pain

## 2023-07-25 NOTE — ED Triage Notes (Signed)
 Pt c/o back pain x3 days, radiation "70% into R leg, some on the L." Numbness/ tingling in BLE, "bowel function has diminished since last week." Flexeril  & tylenol  for pain, toradol  at UC, 10 to 8 pain.   Seen at [facility] for same, sent to r/o cauda equina

## 2023-07-25 NOTE — ED Provider Notes (Signed)
 [place] EMERGENCY DEPARTMENT AT [facility] Provider Note   CSN: [id number] Arrival date & time: 07/25/23  1329     History  Chief Complaint  Patient presents with   Back Pain   Sciatica    Brittney [name] is a 53 y.o. female.   Back Pain   53 year old female presents to the ED with complaints of low back pain.  States that she has had low back pain since Friday.  States that she was standing up from a desk when she began to feel pain which has persisted since onset.  Reports radiation of pain down bilateral legs to about mid to lower part of thigh.  Reports tingling sensation down bilateral legs to feet.  States that she has tried Flexeril , Tylenol , tramadol  with minimal improvement of symptoms.  Was seen at the urgent care where she was given a shot of Toradol  which seemed to help some send to the emergency department for further assessment/evaluation.  Patient denies any saddle anesthesia.  Reports feelings of constipation over the past few days with last bowel movement this morning.  Denies any urinary dysfunction.  Denies any weakness in her legs but does state that the pain is somewhat limiting as with any movement of her right leg especially causes significant pain in her low back with radiation.  Denies any fever, history of IV drug use, prolonged corticosteroid use, known malignancy.  Does state that she his arthritis of the left hip and is dealt with pain for some time.  Does have an MRI of left hip scheduled for assessment of labral injury.  States that her current pain is different than the left hip pain that she has had in the past.  Past medical history significant for pseudotumor cerebri, bipolar disorder, GERD, hyperlipidemia, rheumatoid arthritis, seizure  Home Medications Prior to Admission medications   Medication Sig Start Date End Date Taking? Authorizing Provider  magnesium  oxide (MAG-OX) 400 (240 Mg) MG tablet Take 400 mg by mouth daily.    [provider]  MAGNESIUM  PO Take by mouth.    [provider]  meloxicam  (MOBIC ) 15 MG tablet Take 1 tablet daily with food for 10-14 days. Then take as needed. 06/14/23   Brooks, Dana, DO  traMADol  (ULTRAM ) 50 MG tablet Take 1 tablet (50 mg total) by mouth every 12 (twelve) hours as needed. 04/27/23   Sandie Cross, PA-C      Allergies    Carbamazepine     Review of Systems   Review of Systems  Musculoskeletal:  Positive for back pain.    Physical Exam Updated Vital Signs BP 110/72 (BP Location: Left Arm)   Pulse 66   Temp 98.4 F (36.9 C) (Oral)   Resp 17   SpO2 100%  Physical Exam Vitals and nursing note reviewed. Exam conducted with a chaperone present.  Constitutional:      General: She is not in acute distress.    Appearance: She is well-developed.  HENT:     Head: Normocephalic and atraumatic.  Eyes:     Conjunctiva/sclera: Conjunctivae normal.  Cardiovascular:     Rate and Rhythm: Normal rate and regular rhythm.     Heart sounds: No murmur heard. Pulmonary:     Effort: Pulmonary effort is normal. No respiratory distress.     Breath sounds: Normal breath sounds.  Abdominal:     Palpations: Abdomen is soft.     Tenderness: There is no abdominal tenderness.  Genitourinary:  Comments: Rectal exam performed with female nursing staff at bedside to assess for rectal pain.  Patient with normal rectal tone. Musculoskeletal:        General: No swelling.     Cervical back: Neck supple.     Comments: No midline tenderness cervical, thoracic, lumbar spine without step-off deformity.  Paraspinal tenderness noted right lumbar region lower.  Tender palpation right buttock.  Straight leg raise elicits pain radiating down right leg to level left knee when raising the right leg.  Patient with symmetric strength bilateral ankle dorsi/plantarflexion, knee flexion/extension.  Difficult to assess knee flexion/extension due to patient's discomfort.  Patient reporting tingling  sensation bilateral legs but sensation symmetric.  DTR symmetric at patella.  Pedal and posterior tibial pulses 2+ bilaterally.  Skin:    General: Skin is warm and dry.     Capillary Refill: Capillary refill takes less than 2 seconds.  Neurological:     Mental Status: She is alert.  Psychiatric:        Mood and Affect: Mood normal.     ED Results / Procedures / Treatments   Labs (all labs ordered are listed, but only abnormal results are displayed) Labs Reviewed - No data to display  EKG None  Radiology No results found.  Procedures Procedures    Medications Ordered in ED Medications - No data to display  ED Course/ Medical Decision Making/ A&P                                 Medical Decision Making Amount and/or Complexity of Data Reviewed Labs: ordered. Radiology: ordered.  Risk Prescription drug management.   This patient presents to the ED for concern of neck pain, back pain, this involves an extensive number of treatment options, and is a complaint that carries with it a high risk of complications and morbidity.  The differential diagnosis includes fracture, strain/sprain, dislocation, ligament/tendon injury, neurovasc compromise, cauda equina, spinal epidural abscess, pyelonephritis, nephrolithiasis, other     Co morbidities that complicate the patient evaluation   See HPI     Additional history obtained:   Additional history obtained from EMR External records from outside source obtained and reviewed including hospital records     Lab Tests:   I Ordered, and personally interpreted labs.  The pertinent results include: No leukocytosis.  No evidence of anemia.  Platelets within range.  No electrolyte abnormalities.  No renal dysfunction.     Imaging Studies ordered:   I ordered imaging studies including CT lumbar spine I independently visualized and interpreted imaging which showed  CT lumbar spine: Mild spondylosis lumbar spine with degenerative  disc disease.  Minimal atheromatous vascular calcifications in the aorta and common iliac arteries.  I agree with the radiologist interpretation   Cardiac Monitoring: / EKG:   N/a     Consultations Obtained:   Consulted attending Dr. Val Garin who was in agreement treatment plan going forward     Problem List / ED Course / Critical interventions / Medication management   Low back pain Medications administered including fentanyl , Solu-Medrol , oxycodone , Lidoderm  Reevaluation of the patient showed that the patient improved after medications administered. I have reviewed the patients home medicines and have made adjustments as needed     Social Determinants of Health:   Denies tobacco, licit drug use     Test / Admission - Considered:   Low back pain Vitals signs within normal range  and stable throughout visit. Imaging studies significant for: See above 53 year old female presents to the ED with complaints of low back pain.  States that she has had low back pain since Friday.  States that she was standing up from a desk when she began to feel pain which has persisted since onset.  Reports radiation of pain down bilateral legs to about mid to lower part of thigh.  Reports tingling sensation down bilateral legs to feet.  States that she has tried Flexeril , Tylenol , tramadol  with minimal improvement of symptoms.  Was seen at the urgent care where she was given a shot of Toradol  which seemed to help some send to the emergency department for further assessment/evaluation.  Patient denies any saddle anesthesia.  Reports feelings of constipation over the past few days with last bowel movement this morning.  Denies any urinary dysfunction.  Denies any weakness in her legs but does state that the pain is somewhat limiting as with any movement of her right leg especially causes significant pain in her low back with radiation.  Denies any fever, history of IV drug use, prolonged corticosteroid use,  known malignancy.  Does state that she his arthritis of the left hip and is dealt with pain for some time.  Does have an MRI of left hip scheduled for assessment of labral injury.  States that her current pain is different than the left hip pain that she has had in the past. On exam, paraspinal tenderness noted in the right lumbar region as well as right buttock.  No red flag signs or back pain on HPI/PE besides subjective tingling sensation with decreased bowel movement other patient with the ability to control her bowel movements.  Patient did have normal rectal tone on exam.  Treated with pain medication as well as Solu-Medrol  with significant improvement of symptoms although still with back pain.  Patient pulse deficits such as this ischemic limb.  No lower extremity edema concerning for DVT.  Patient not urinary symptoms, CVA tenderness; low suspicion for pyelonephritis, nephrolithiasis.  Patient without abdominal pain rating to back, pulse deficits, neurodeficits, hypertension; low suspicion for aortic dissection.  CT imaging of patient's lumbar spine was obtained which did show multiple degenerative disc changes as above without any acute emergent abnormality.  Patient reassured by findings.-Very low clinical suspicion for cauda equina, spinal epidural abscess, other spinal cord compression/impingement to warrant emergent MRI at this time.  Will trial prednisone  taper, PT evaluation/treatment, rehab exercises at home in the interim and follow-up with neurosurgery for continued evaluation.  Treatment plan discussed with patient and she knowledge understanding was agreeable to said plan.  Patient will well-appearing, afebrile in no acute distress. Worrisome signs and symptoms were discussed with the patient, and the patient acknowledged understanding to return to the ED if noticed. Patient was stable upon discharge.           Final Clinical Impression(s) / ED Diagnoses Final diagnoses:  None     Rx / DC Orders ED Discharge Orders     None         Horine Butter, [place] 07/25/23 [place]    Mozell Arias, MD 07/26/23 1447

## 2023-07-26 ENCOUNTER — Ambulatory Visit (HOSPITAL_COMMUNITY)

## 2023-09-02 ENCOUNTER — Encounter: Admitting: Internal Medicine

## 2023-11-15 ENCOUNTER — Ambulatory Visit (HOSPITAL_COMMUNITY)
Admission: RE | Admit: 2023-11-15 | Discharge: 2023-11-15 | Disposition: A | Discharge status: Home / Self Care | Source: Ambulatory Visit | Attending: Family Medicine | Admitting: Family Medicine

## 2023-11-15 ENCOUNTER — Encounter (HOSPITAL_COMMUNITY): Payer: Self-pay

## 2023-11-15 VITALS — BP 115/82 | HR 79 | Temp 99.3°F | Resp 18

## 2023-11-15 DIAGNOSIS — M797 Fibromyalgia: Secondary | ICD-10-CM

## 2023-11-15 MED ORDER — DEXAMETHASONE SODIUM PHOSPHATE 10 MG/ML IJ SOLN
INTRAMUSCULAR | Status: AC
  Filled 2023-11-15: qty 1

## 2023-11-15 MED ORDER — DEXAMETHASONE SODIUM PHOSPHATE 10 MG/ML IJ SOLN
10.0000 mg | Freq: Once | INTRAMUSCULAR | Status: AC
  Administered 2023-11-15: 10 mg via INTRAMUSCULAR

## 2023-11-15 MED ORDER — KETOROLAC TROMETHAMINE 30 MG/ML IJ SOLN
INTRAMUSCULAR | Status: AC
  Filled 2023-11-15: qty 1

## 2023-11-15 MED ORDER — KETOROLAC TROMETHAMINE 30 MG/ML IJ SOLN
30.0000 mg | Freq: Once | INTRAMUSCULAR | Status: AC
  Administered 2023-11-15: 30 mg via INTRAMUSCULAR

## 2023-11-15 NOTE — ED Provider Notes (Signed)
 [facility] CARE CENTER   [id number] 11/15/23 Arrival Time: 1620  ASSESSMENT & PLAN:  1. Fibromyalgia   With exacerbation of pain.  Meds ordered this encounter  Medications   dexamethasone  (DECADRON ) injection 10 mg   ketorolac  (TORADOL ) 30 MG/ML injection 30 mg     Follow-up Information     Schedule an appointment as soon as possible for a visit  with Ngetich, Dinah C, NP.   Specialty: Family Medicine Why: For follow up. Contact information: [address] [place] [address] [phone]         [facility] at [facility].   Specialty: Urgent Care Why: As needed. Contact information: [address] [place]  [address] [phone]                Reviewed expectations re: course of current medical issues. Questions answered. Outlined signs and symptoms indicating need for more acute intervention. Understanding verbalized. After Visit Summary given.   SUBJECTIVE: History from: Patient. Brittney [name] [facility] is a 53 y.o. female. Pt c/o fibromyalgia flare all over x2 wks. States took tylenol  with no relief.    OBJECTIVE:  Vitals:   11/15/23 1655  BP: 115/82  Pulse: 79  Resp: 18  Temp: 99.3 F (37.4 C)  TempSrc: Oral  SpO2: 96%    General appearance: alert; no distress  Psychological: alert and cooperative; normal mood and affect  Labs:  Labs Reviewed - No data to display  Imaging: No results found.  Allergies  Allergen Reactions   Carbamazepine  Rash    Past Medical History:  Diagnosis Date   Abscess    to eyelid   Anxiety    Arthritis    KNEES ELBOWS HIPS SHOULDER FINGERS   Asthma    Back pain    Bipolar disorder (HCC)    Depression    BIPOLAR   Dyspnea    Fibromyalgia    GERD (gastroesophageal reflux disease)    Headache syndrome 12/12/2019   Headache(784.0)    MIGRAINES   History of colonoscopy    History of degenerative disc disease    Hyperlipidemia    Major depressive disorder     Memory difficulties 06/21/2014   Neck pain    OA (osteoarthritis) of knee    Obesity    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   Rheumatoid arthritis (HCC)    Seizures (HCC)    FACIAL SEIZURES LAST 4 DAYS AGO   Social History   Socioeconomic History   Marital status: Divorced    Spouse name: Not on file   Number of children: 5   Years of education: BA   Highest education level: Not on file  Occupational History   Not on file  Tobacco Use   Smoking status: Former    Current packs/day: 0.00    Types: Cigarettes    Quit date: 02/16/2003    Years since quitting: 20.7   Smokeless tobacco: Never  Vaping Use   Vaping status: Never Used  Substance and Sexual Activity   Alcohol  use: No    Alcohol /week: 0.0 standard drinks of alcohol     Comment: occasionally   Drug use: No   Sexual activity: Not Currently  Other Topics Concern   Not on file  Social History Narrative   Tobacco use, amount per day now: N/A   Past tobacco use, amount per day: 1/2 packs per day    How many years did you use tobacco: 20 years   Alcohol  use (drinks per week):  N/A   Diet: Anti    Do you drink/eat things with caffeine: 1 cup of coffee daily.   Marital status: Divorced                                 What year were you married? 1993 & 2004   Do you live in a house, apartment, assisted living, condo, trailer, etc.? House   Is it one or more stories? 1.5   How many persons live in your home? 2   Do you have pets in your home?( please list) 1 Dog.   Highest Level of education completed? Some graduate, Glass blower/designer.   Current or past profession: Engineer, maintenance (IT), Programme researcher, broadcasting/film/video, Production manager.    Do you exercise?                                  Type and how often?   Do you have a living will?   Yes   Do you have a DNR form?  Yes                              If not, do you want to discuss one?   Do you have signed POA/HPOA forms?  Yes                      If so, please bring to you appointment      Do  you have any difficulty bathing or dressing yourself? Yes   Do you have any difficulty preparing food or eating? Yes   Do you have any difficulty managing your medications? No   Do you have any difficulty managing your finances? Yes   Do you have any difficulty affording your medications? No   Social Drivers of Corporate investment banker Strain: Low Risk  (06/13/2023)   Received from [facility]   Overall Financial Resource Strain (CARDIA)    Difficulty of Paying Living Expenses: Not very hard  Food Insecurity: No Food Insecurity (06/13/2023)   Received from [facility]   Hunger Vital Sign    Within the past 12 months, you worried that your food would run out before you got the money to buy more.: Never true    Within the past 12 months, the food you bought just didn't last and you didn't have money to get more.: Never true  Transportation Needs: No Transportation Needs (06/13/2023)   Received from [facility] - Transportation    Lack of Transportation (Medical): No    Lack of Transportation (Non-Medical): No  Physical Activity: Unknown (06/13/2023)   Received from [facility]   Exercise Vital Sign    On average, how many days per week do you engage in moderate to strenuous exercise (like a brisk walk)?: 0 days    Minutes of Exercise per Session: Not on file  Stress: No Stress Concern Present (06/13/2023)   Received from [facility] - Occupational Stress Questionnaire    Feeling of Stress : Only a little  Social Connections: Moderately Integrated (06/13/2023)   Received from [facility]   Social Network    How would you rate your social network (family, work, friends)?: Adequate participation with social networks  Intimate Partner Violence: Not At Risk (  06/13/2023)   Received from [facility]   HITS    Over the last 12 months how often did your partner physically hurt you?: Never    Over the last 12 months how often  did your partner insult you or talk down to you?: Never    Over the last 12 months how often did your partner threaten you with physical harm?: Never    Over the last 12 months how often did your partner scream or curse at you?: Never   Family History  Problem Relation Age of Onset   Cirrhosis Mother    Cancer - Lung Father    Diabetes Father    Neuropathy Father    Migraines Sister    Stroke Sister    Healthy Sister    Healthy Sister    Healthy Sister    Healthy Brother    Healthy Brother    Healthy Brother    Healthy Brother    Healthy Brother    Healthy Brother    Heart disease Maternal Grandmother    Alzheimer's disease Maternal Grandfather    Cancer - Lung Paternal Grandmother    Breast cancer Daughter 30   Neuropathy Daughter    Past Surgical History:  Procedure Laterality Date   CARDIAC CATHETERIZATION  2010   DILATION AND CURETTAGE OF UTERUS  1994   LAPAROSCOPY N/A 11/21/2012   Procedure: LAPAROSCOPY OPERATIVE REMOVAL OF RIGHT TUBE AND OVARY AND FLUID FROM MASS;  Surgeon: Debby JULIANNA Lares, MD;  Location: [facility];  Service: Gynecology;  Laterality: N/A;  1 1/2hrs OR time   JEB Rolinda Rogue, MD 11/15/23 1757

## 2023-11-15 NOTE — Discharge Instructions (Signed)
Meds ordered this encounter  Medications   dexamethasone (DECADRON) injection 10 mg   ketorolac (TORADOL) 30 MG/ML injection 30 mg

## 2023-11-15 NOTE — ED Triage Notes (Signed)
 Pt c/o fibromyalgia flare all over x2 wks. States took tylenol  with no relief.

## 2023-12-05 ENCOUNTER — Ambulatory Visit: Payer: Self-pay

## 2023-12-05 ENCOUNTER — Ambulatory Visit (INDEPENDENT_AMBULATORY_CARE_PROVIDER_SITE_OTHER): Admitting: Family

## 2023-12-05 ENCOUNTER — Encounter: Payer: Self-pay | Admitting: Family

## 2023-12-05 VITALS — BP 128/80 | HR 69 | Temp 97.8°F | Resp 20 | Ht 68.0 in | Wt 238.0 lb

## 2023-12-05 DIAGNOSIS — R0989 Other specified symptoms and signs involving the circulatory and respiratory systems: Secondary | ICD-10-CM | POA: Diagnosis not present

## 2023-12-05 DIAGNOSIS — R0981 Nasal congestion: Secondary | ICD-10-CM

## 2023-12-05 DIAGNOSIS — R059 Cough, unspecified: Secondary | ICD-10-CM | POA: Diagnosis not present

## 2023-12-05 LAB — POCT INFLUENZA A/B
Influenza A, POC: NEGATIVE
Influenza B, POC: NEGATIVE

## 2023-12-05 LAB — POC COVID19 BINAXNOW: SARS Coronavirus 2 Ag: NEGATIVE

## 2023-12-05 MED ORDER — GUAIFENESIN ER 600 MG PO TB12: 600.0000 mg | ORAL_TABLET | Freq: Two times a day (BID) | ORAL | 0 refills | Status: AC

## 2023-12-05 MED ORDER — FLUTICASONE PROPIONATE 50 MCG/ACT NA SUSP: 2.0000 | Freq: Every day | NASAL | 6 refills | Status: AC

## 2023-12-05 NOTE — Telephone Encounter (Signed)
 Carlo Credit, RN to [facility] (Selected Message)     12/05/23 10:58 AM Appt scheduled. See triage note.

## 2023-12-05 NOTE — Telephone Encounter (Signed)
 FYI Only or Action Required?: FYI only for provider.  Patient was last seen in primary care on 04/19/2023 by Brittney [name], Brittney BROCKS, NP.  Called Nurse Triage reporting Shortness of Breath.  Symptoms began several days ago.  Interventions attempted: OTC medications: sudafed; mucinex ; nyquil & dayquil.  Symptoms are: gradually worsening.  Triage Disposition: See HCP Within 4 Hours (Or PCP Triage)  Patient/caregiver understands and will follow disposition?: Yes  Copied from CRM #[id number]. Topic: Clinical - Red Word Triage >> Dec 05, 2023 10:37 AM Zane F wrote: Red Word that prompted transfer to Nurse Triage:   Concern: worsening shortness of breath  Symptoms:   shortness of breath  Congestion  Fatigue  sneezing   When did the symptoms start?: Saturday 11/26/2023   What have you done to aid in the concern ? Have you taken anything to assist with the matter?: Yes   If so, what did you take?: sudafed; mucinex ; nyquil & day quil   Wanted to let you know I will be transferring you to further discuss your concern. Please be advised the nurse can assist with scheduling. Reason for Disposition  [1] MILD difficulty breathing (e.g., minimal/no SOB at rest, SOB with walking, pulse < 100) AND [2] NEW-onset or WORSE than normal  Answer Assessment - Initial Assessment Questions Pt with question about what her co-pay would be. Contacted [employer] [facility] and transferred pt to Alandra for assistance with insurance related questions.  1. RESPIRATORY STATUS: Describe your breathing? (e.g., wheezing, shortness of breath, unable to speak, severe coughing)      Reports breathing feels more labored when talking and with exertion  2. ONSET: When did this breathing problem begin?      Saturday  3. PATTERN Does the difficult breathing come and go, or has it been constant since it started?      With exertion  4. SEVERITY: How bad is your breathing? (e.g., mild, moderate,  severe)      Mild  5. RECURRENT SYMPTOM: Have you had difficulty breathing before? If Yes, ask: When was the last time? and What happened that time?      When pt had covid last year. Has not taken covid test.  6. CARDIAC HISTORY: Do you have any history of heart disease? (e.g., heart attack, angina, bypass surgery, angioplasty)      No  7. LUNG HISTORY: Do you have any history of lung disease?  (e.g., pulmonary embolus, asthma, emphysema)     No  8. CAUSE: What do you think is causing the breathing problem?      Chest cold.  9. OTHER SYMPTOMS: Do you have any other symptoms? (e.g., chest pain, cough, dizziness, fever, runny nose)     Mucus, congestion, cough dry (infrequent). Had a sore throat but has since resolved.  10. O2 SATURATION MONITOR:  Do you use an oxygen saturation monitor (pulse oximeter) at home? If Yes, ask: What is your reading (oxygen level) today? What is your usual oxygen saturation reading? (e.g., 95%)       Does not monitor O2  11. PREGNANCY: Is there any chance you are pregnant? When was your last menstrual period?       No  12. TRAVEL: Have you traveled out of the country in the last month? (e.g., travel history, exposures)       No  Protocols used: Breathing Difficulty-A-AH

## 2023-12-10 ENCOUNTER — Ambulatory Visit
Admission: RE | Admit: 2023-12-10 | Discharge: 2023-12-10 | Disposition: A | Discharge status: Home / Self Care | Source: Ambulatory Visit | Attending: Family | Admitting: Family

## 2023-12-10 ENCOUNTER — Other Ambulatory Visit: Payer: Self-pay | Admitting: Family

## 2023-12-10 DIAGNOSIS — Z1231 Encounter for screening mammogram for malignant neoplasm of breast: Secondary | ICD-10-CM

## 2023-12-14 NOTE — Progress Notes (Signed)
 Provider: Roxan Plough FNP-C   Solae Norling, Roxan BROCKS, NP  Patient Care Team: Loria Lacina, Roxan BROCKS, NP as PCP - General (Family Medicine) Jacques Sharper, MD as Consulting Physician (Ophthalmology) Lennie Andriette CROME, NP as Nurse Practitioner (Nurse Practitioner) Delos Antonia HERO, MD as Consulting Physician (Internal Medicine)  Extended Emergency Contact Information Primary Emergency Contact: BAR[place], [place] [address] [place] Phone: [phone] Relation: Son Secondary Emergency Contact: Gaither Mardene [place], [place] [address] [place] Home Phone: [phone] Relation: Other  Code Status:  Full Code  Goals of care: Advanced Directive information    12/05/2023    2:02 PM  Advanced Directives  Does Patient Have a Medical Advance Directive? Yes  Type of Estate Agent of [place];Living will  Copy of Healthcare Power of Attorney in Chart? Yes - validated most recent copy scanned in chart (See row information)  Would patient like information on creating a medical advance directive? No - Patient declined     Chief Complaint  Patient presents with   Cough    Mild shortness of breath & congestion.      Discussed the use of AI scribe software for clinical note transcription with the patient, who gave verbal consent to proceed.  History of Present Illness   Brittney [name] is a 53 year old female who presents with persistent fatigue and congestion.  She has been experiencing persistent head and chest congestion for approximately ten days, which has not improved with over-the-counter medications such as Mucinex , Sudafed, NyQuil, and DayQuil. The symptoms began with a sore throat, which has since resolved. She experiences occasional sneezing and a non-productive cough once a day. There is mild shortness of breath and dizziness, but no runny nose, sinus pain, or tenderness.  She describes feeling  extremely fatigued and exhausted despite the absence of fever. Her appetite is decreased, though she is making an effort to eat, having consumed eggs before the visit. She has been around large crowds prior to falling ill, suggesting possible exposure to illness. No fever, chills, or runny nose.  She is also battling depression, which she acknowledges could be contributing to her fatigue. She has not had blood work done in over six months, with her last A1c recorded at 6.0. She is concerned about the overwhelming fatigue she is experiencing.   Past Medical History:  Diagnosis Date   Abscess    to eyelid   Anxiety    Arthritis    KNEES ELBOWS HIPS SHOULDER FINGERS   Asthma    Back pain    Bipolar disorder (HCC)    Depression    BIPOLAR   Dyspnea    Fibromyalgia    GERD (gastroesophageal reflux disease)    Headache syndrome 12/12/2019   Headache(784.0)    MIGRAINES   History of colonoscopy    History of degenerative disc disease    Hyperlipidemia    Major depressive disorder    Memory difficulties 06/21/2014   Neck pain    OA (osteoarthritis) of knee    Obesity    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   Rheumatoid arthritis (HCC)    Seizures (HCC)    FACIAL SEIZURES LAST 4 DAYS AGO   Past Surgical History:  Procedure Laterality Date   CARDIAC CATHETERIZATION  2010   DILATION AND CURETTAGE OF UTERUS  1994   LAPAROSCOPY N/A 11/21/2012   Procedure: LAPAROSCOPY  OPERATIVE REMOVAL OF RIGHT TUBE AND OVARY AND FLUID FROM MASS;  Surgeon: Debby JULIANNA Lares, MD;  Location: [facility];  Service: Gynecology;  Laterality: N/A;  1 1/2hrs OR time   OOPHORECTOMY      Allergies  Allergen Reactions   Carbamazepine  Rash    Allergies as of 12/05/2023       Reactions   Carbamazepine  Rash        Medication List        Accurate as of December 05, 2023 11:59 PM. If you have any questions, ask your nurse or doctor.          Fish Oil 1000 MG Caps Take by mouth daily.   fluticasone   50 MCG/ACT nasal spray Commonly known as: FLONASE  Place 2 sprays into both nostrils daily. Started by: Alanta Scobey C Azani Brogdon   guaiFENesin  600 MG 12 hr tablet Commonly known as: Mucinex  Take 1 tablet (600 mg total) by mouth 2 (two) times daily for 10 days. Started by: Symphany Fleissner C Azavier Creson   MAGNESIUM  OXIDE 400 PO Take by mouth daily.   ONE-A-DAY WOMENS PO Take by mouth daily.   Vitamin D-3 125 MCG (5000 UT) Tabs Take by mouth daily.        Review of Systems  Constitutional:  Negative for appetite change, chills, fatigue, fever and unexpected weight change.  HENT:  Positive for congestion. Negative for dental problem, ear discharge, ear pain, facial swelling, hearing loss, nosebleeds, postnasal drip, rhinorrhea, sinus pressure, sinus pain, sneezing, sore throat, tinnitus and trouble swallowing.   Eyes:  Negative for pain, discharge, redness, itching and visual disturbance.  Respiratory:  Positive for cough. Negative for chest tightness, shortness of breath and wheezing.   Cardiovascular:  Negative for chest pain, palpitations and leg swelling.  Gastrointestinal:  Negative for abdominal distention, abdominal pain, constipation, diarrhea, nausea and vomiting.  Genitourinary:  Negative for difficulty urinating, dysuria, flank pain, frequency and urgency.  Musculoskeletal:  Negative for arthralgias, back pain, gait problem, joint swelling and myalgias.  Skin:  Negative for color change, pallor and rash.  Neurological:  Negative for dizziness, weakness, light-headedness, numbness and headaches.    Immunization History  Administered Date(s) Administered   Fluzone Influenza virus vaccine,trivalent (IIV3), split virus 11/29/2008   Influenza Split 12/03/2010   Influenza,inj,Quad PF,6+ Mos 02/12/2022   Janssen (J&J) SARS-COV-2 Vaccination 05/24/2019   PFIZER(Purple Top)SARS-COV-2 Vaccination 12/10/2019, 09/16/2020   PNEUMOCOCCAL CONJUGATE-20 06/15/2023   Pfizer(Comirnaty)Fall Seasonal Vaccine  12 years and older 02/12/2022   Zoster Recombinant(Shingrix) 12/23/2020, 02/12/2022   Pertinent  Health Maintenance Due  Topic Date Due   Influenza Vaccine  09/16/2023   Mammogram  11/12/2024   Colonoscopy  01/25/2029      02/28/2022   11:52 AM 02/28/2022   12:51 PM 01/25/2023    1:26 PM 04/19/2023    2:32 PM 12/05/2023    2:02 PM  Fall Risk  Falls in the past year?   0 1 1  Was there an injury with Fall?   0 0 0  Fall Risk Category Calculator   0 1 1  (RETIRED) Patient Fall Risk Level High fall risk  Moderate fall risk      Patient at Risk for Falls Due to    No Fall Risks No Fall Risks  Fall risk Follow up    Falls evaluation completed;Education provided;Falls prevention discussed Falls evaluation completed     Data saved with a previous flowsheet row definition   Functional Status Survey:    Vitals:  12/05/23 1411  BP: 128/80  Pulse: 69  Resp: 20  Temp: 97.8 F (36.6 C)  SpO2: 96%  Weight: 238 lb (108 kg)  Height: 5' 8 (1.727 m)   Body mass index is 36.19 kg/m. Physical Exam  VITALS: T- 97.8, P- 69, BP- 128/80, SaO2- 96% MEASUREMENTS: Weight- 238. GENERAL: Alert, cooperative, well developed, no acute distress HEENT: Normocephalic, normal oropharynx, moist mucous membranes, ears normal bilaterally, nasal congestion present, no sinus tenderness CHEST: Clear to auscultation bilaterally, no wheezes, rhonchi, or crackles CARDIOVASCULAR: Normal heart rate and rhythm, S1 and S2 normal without murmurs ABDOMEN: Soft, non-tender, non-distended, without organomegaly, normal bowel sounds EXTREMITIES: No cyanosis or edema NEUROLOGICAL: Cranial nerves grossly intact, moves all extremities without gross motor or sensory deficit   Labs reviewed: Recent Labs    07/25/23 1412  NA 140  K 4.1  CL 101  CO2 27  GLUCOSE 110*  BUN 13  CREATININE 0.94  CALCIUM 10.2   No results for input(s): AST, ALT, ALKPHOS, BILITOT, PROT, ALBUMIN in the last 8760  hours. Recent Labs    07/25/23 1412  WBC 5.9  HGB 12.5  HCT 38.9  MCV 86.1  PLT 308   Lab Results  Component Value Date   TSH 0.500 12/14/2018   Lab Results  Component Value Date   HGBA1C 6.0 (H) 12/14/2018   Lab Results  Component Value Date   CHOL 200 12/14/2018   HDL 58 12/14/2018   LDLCALC 131 (H) 12/14/2018   TRIG 57 12/14/2018   CHOLHDL 3.4 12/14/2018    Significant Diagnostic Results in last 30 days:  No results found.  Assessment/Plan  Acute upper respiratory tract infection with nasal and chest congestion Symptoms include head and chest congestion, mild dyspnea, and fatigue for ten days. No fever, chills, or significant cough. Negative COVID-19 and influenza tests. Physical exam reveals nasal congestion with clear throat and lungs. No sinusitis. Over-the-counter medications (Mucinex , Sudafed, NyQuil, DayQuil) have provided minimal relief. - Recommend Flonase  nasal spray for nasal congestion. - Advise Mucinex  twice daily for up to ten days to loosen congestion. - Encourage increased water intake for congestion relief.  Fatigue Persistent fatigue possibly related to current upper respiratory infection. Differential diagnosis includes diabetes, thyroid  dysfunction, and depression. No blood work in over six months; previous A1c was 6.0. - Schedule blood work in one to two weeks to assess for diabetes and thyroid  function. - Advise fasting before blood work.  Depression Currently experiencing depression, potentially contributing to fatigue.  Declined antidepressant  - continue to monitor mood   Family/ staff Communication: Reviewed plan of care with patient verbalized understanding   Labs/tests ordered:  - POC COVID-19 - POC Influenza A/B  Next Appointment : Return in about 2 weeks (around 12/19/2023) for medical mangement of chronic issues.SABRA   Spent 20 minutes of Face to face and non-face to face with patient  >50% time spent counseling; reviewing medical  record; tests; labs; documentation and developing future plan of care.   Roxan JAYSON Plough, NP

## 2023-12-19 ENCOUNTER — Encounter: Payer: Self-pay | Admitting: Radiology

## 2023-12-26 ENCOUNTER — Encounter: Payer: Self-pay | Admitting: Family

## 2023-12-29 NOTE — Progress Notes (Signed)
   This encounter was created in error - please disregard. No show
